# Patient Record
Sex: Female | Born: 1960 | Race: White | Hispanic: No | Marital: Married | Smoking: Former smoker
Health system: Southern US, Community
[De-identification: ages and names within clinical notes are randomized; demographics above are authoritative.]

## PROBLEM LIST (undated history)

## (undated) DIAGNOSIS — F32A Depression, unspecified: Secondary | ICD-10-CM

## (undated) DIAGNOSIS — F419 Anxiety disorder, unspecified: Secondary | ICD-10-CM

## (undated) DIAGNOSIS — R112 Nausea with vomiting, unspecified: Secondary | ICD-10-CM

## (undated) DIAGNOSIS — R42 Dizziness and giddiness: Secondary | ICD-10-CM

## (undated) DIAGNOSIS — N6310 Unspecified lump in the right breast, unspecified quadrant: Secondary | ICD-10-CM

## (undated) DIAGNOSIS — M1990 Unspecified osteoarthritis, unspecified site: Secondary | ICD-10-CM

## (undated) DIAGNOSIS — K219 Gastro-esophageal reflux disease without esophagitis: Secondary | ICD-10-CM

## (undated) DIAGNOSIS — M722 Plantar fascial fibromatosis: Secondary | ICD-10-CM

## (undated) DIAGNOSIS — Z9889 Other specified postprocedural states: Secondary | ICD-10-CM

## (undated) DIAGNOSIS — B019 Varicella without complication: Secondary | ICD-10-CM

## (undated) DIAGNOSIS — U071 COVID-19: Secondary | ICD-10-CM

## (undated) HISTORY — PX: HIP ARTHROPLASTY: SHX981

## (undated) HISTORY — DX: Anxiety disorder, unspecified: F41.9

## (undated) HISTORY — PX: OTHER SURGICAL HISTORY: SHX169

## (undated) HISTORY — DX: Gastro-esophageal reflux disease without esophagitis: K21.9

## (undated) HISTORY — DX: Unspecified osteoarthritis, unspecified site: M19.90

## (undated) HISTORY — PX: CARPAL TUNNEL RELEASE: SHX101

## (undated) HISTORY — DX: Plantar fascial fibromatosis: M72.2

## (undated) HISTORY — DX: Depression, unspecified: F32.A

## (undated) HISTORY — DX: COVID-19: U07.1

## (undated) HISTORY — DX: Varicella without complication: B01.9

---
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
## 1999-05-07 HISTORY — PX: LASIK: SHX215

## 2004-01-06 ENCOUNTER — Other Ambulatory Visit: Payer: Self-pay

## 2004-02-28 ENCOUNTER — Ambulatory Visit: Payer: Self-pay | Admitting: Unknown Physician Specialty

## 2004-08-22 ENCOUNTER — Ambulatory Visit: Payer: Self-pay | Admitting: Unknown Physician Specialty

## 2004-11-15 ENCOUNTER — Ambulatory Visit: Payer: Self-pay

## 2005-02-25 ENCOUNTER — Ambulatory Visit: Payer: Self-pay | Admitting: Unknown Physician Specialty

## 2005-04-30 ENCOUNTER — Ambulatory Visit: Payer: Self-pay | Admitting: Orthopaedic Surgery

## 2005-05-21 ENCOUNTER — Ambulatory Visit: Payer: Self-pay | Admitting: Unknown Physician Specialty

## 2005-12-13 ENCOUNTER — Ambulatory Visit: Payer: Self-pay | Admitting: Unknown Physician Specialty

## 2006-02-25 ENCOUNTER — Ambulatory Visit: Payer: Self-pay | Admitting: Unknown Physician Specialty

## 2006-04-09 ENCOUNTER — Ambulatory Visit: Payer: Self-pay | Admitting: Pain Medicine

## 2006-04-17 ENCOUNTER — Ambulatory Visit: Payer: Self-pay | Admitting: Pain Medicine

## 2006-05-08 ENCOUNTER — Ambulatory Visit: Payer: Self-pay | Admitting: Physician Assistant

## 2006-05-29 ENCOUNTER — Ambulatory Visit: Payer: Self-pay | Admitting: Pain Medicine

## 2006-06-11 ENCOUNTER — Ambulatory Visit: Payer: Self-pay | Admitting: Unknown Physician Specialty

## 2006-06-12 ENCOUNTER — Ambulatory Visit: Payer: Self-pay | Admitting: Pain Medicine

## 2006-06-30 ENCOUNTER — Ambulatory Visit: Payer: Self-pay | Admitting: Physician Assistant

## 2006-07-21 ENCOUNTER — Ambulatory Visit: Payer: Self-pay | Admitting: Chiropractic Medicine

## 2006-08-13 ENCOUNTER — Encounter: Payer: Self-pay | Admitting: Neurosurgery

## 2006-09-04 ENCOUNTER — Encounter: Payer: Self-pay | Admitting: Neurosurgery

## 2007-06-29 ENCOUNTER — Ambulatory Visit: Payer: Self-pay | Admitting: Unknown Physician Specialty

## 2008-05-06 HISTORY — PX: REDUCTION MAMMAPLASTY: SUR839

## 2008-05-06 HISTORY — PX: BREAST SURGERY: SHX581

## 2008-07-07 ENCOUNTER — Ambulatory Visit: Payer: Self-pay | Admitting: Unknown Physician Specialty

## 2009-07-10 ENCOUNTER — Ambulatory Visit: Payer: Self-pay | Admitting: Unknown Physician Specialty

## 2009-08-11 ENCOUNTER — Ambulatory Visit: Payer: Self-pay | Admitting: Unknown Physician Specialty

## 2010-05-06 HISTORY — PX: CERVICAL SPINE SURGERY: SHX589

## 2010-07-24 ENCOUNTER — Ambulatory Visit: Payer: Self-pay | Admitting: Unknown Physician Specialty

## 2010-11-06 ENCOUNTER — Encounter (HOSPITAL_COMMUNITY)
Admission: RE | Admit: 2010-11-06 | Discharge: 2010-11-06 | Disposition: A | Discharge status: Home / Self Care | Payer: BC Managed Care – PPO | Source: Ambulatory Visit | Attending: Neurosurgery | Admitting: Neurosurgery

## 2010-11-06 LAB — URINALYSIS, ROUTINE W REFLEX MICROSCOPIC
Glucose, UA: NEGATIVE mg/dL
Ketones, ur: NEGATIVE mg/dL
Leukocytes, UA: NEGATIVE
Nitrite: NEGATIVE
Specific Gravity, Urine: 1.017 (ref 1.005–1.030)
pH: 5.5 (ref 5.0–8.0)

## 2010-11-06 LAB — BASIC METABOLIC PANEL
Calcium: 9.2 mg/dL (ref 8.4–10.5)
GFR calc Af Amer: >60 mL/min (ref >60)
GFR calc non Af Amer: >60 mL/min (ref >60)
Glucose, Bld: 86 mg/dL (ref 70–99)
Potassium: 4.4 mEq/L (ref 3.5–5.1)
Sodium: 138 mEq/L (ref 135–145)

## 2010-11-06 LAB — APTT: aPTT: 26 seconds (ref 24–37)

## 2010-11-06 LAB — PROTIME-INR
INR: 0.88 (ref 0.00–1.49)
Prothrombin Time: 12.1 seconds (ref 11.6–15.2)

## 2010-11-06 LAB — SURGICAL PCR SCREEN: Staphylococcus aureus: NEGATIVE

## 2010-11-06 LAB — CBC
HCT: 38.8 % (ref 36.0–46.0)
Hemoglobin: 13.3 g/dL (ref 12.0–15.0)
MCHC: 34.3 g/dL (ref 30.0–36.0)
RBC: 4.39 MIL/uL (ref 3.87–5.11)
WBC: 5.1 10*3/uL (ref 4.0–10.5)

## 2010-11-13 ENCOUNTER — Inpatient Hospital Stay (HOSPITAL_COMMUNITY): Payer: BC Managed Care – PPO

## 2010-11-13 ENCOUNTER — Ambulatory Visit (HOSPITAL_COMMUNITY)
Admission: RE | Admit: 2010-11-13 | Discharge: 2010-11-14 | Disposition: A | Discharge status: Home / Self Care | Payer: BC Managed Care – PPO | Source: Ambulatory Visit | Attending: Neurosurgery | Admitting: Neurosurgery

## 2010-11-13 DIAGNOSIS — M5000 Cervical disc disorder with myelopathy, unspecified cervical region: Secondary | ICD-10-CM | POA: Insufficient documentation

## 2010-11-13 DIAGNOSIS — M4712 Other spondylosis with myelopathy, cervical region: Principal | ICD-10-CM | POA: Insufficient documentation

## 2010-11-13 DIAGNOSIS — M129 Arthropathy, unspecified: Secondary | ICD-10-CM | POA: Insufficient documentation

## 2010-11-13 DIAGNOSIS — K219 Gastro-esophageal reflux disease without esophagitis: Secondary | ICD-10-CM | POA: Insufficient documentation

## 2010-11-13 DIAGNOSIS — Z01812 Encounter for preprocedural laboratory examination: Secondary | ICD-10-CM | POA: Insufficient documentation

## 2010-11-19 NOTE — Op Note (Signed)
NAMEROLENA, KNUTSON NO.:  [id number]  MEDICAL RECORD NO.:  [id number]  LOCATION:  3526                         FACILITY:  [facility]  PHYSICIAN:  Clydene Fake, M.D.  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  PROCEDURE:  Anterior cervical decompression and diskectomy and fusion of C4-5, C5-6, C6-7 with LifeNet allograft bones, and Trestle anterior cervical plate.  SURGEON:  Clydene Fake, MD  ASSISTANT:  Hewitt Shorts, MD  General endotracheal tube anesthesia.  ESTIMATED BLOOD LOSS:  Minimal.  BLOOD GIVEN:  None.  DRAINS:  None.  COMPLICATIONS:  None.  REASON FOR PROCEDURE:  The patient is a 50 year old woman who has been having neck and right arm pain numbness and weakness found to have weakness in the right biceps and triceps and positive Spurling's at the right side.  MRI and x-ray shows spondylitic changes at multiple levels and though there was a bifemoral narrowing much worse on the right side at the C4-5 and C5-6 levels, bifemoral narrowing at the C6-7 level.  The patient was brought in for decompression and fusion at these 3 levels.  PROCEDURE IN DETAIL:  The patient was brought to the operating room and general anesthesia induced.  The patient was placed in 10 pounds of Halter traction, prepped and draped in usual sterile fashion.  Site of incision injected with 10 mL of 1% lidocaine with epinephrine.  Incision was then made in the left side neck from midline to the anterior border of the sternocleidomastoid muscle.  Incision was taken down to the platysma and  hemostasis was obtained with Bovie cauterization.  Platysma muscle was incised with Bovie and blunt dissection taken through the anterior cervical fascia to the anterior cervical spine.  We placed 2 needles where we thought it was the C4-5 and C6-7 disk spaces, and another at the C5-6 space in between where the needles were and took a lateral C-spine x-ray which confirmed our positioning.  Disk spaces were incised with a 15-blade and partial diskectomy done as the needles were removed at the C4-5 and C6-7 levels and then disk space at C5-6 was incised and partial diskectomy done.  The longus coli muscles were reflected laterally using Bovie and self-retaining retractor was placed in C4, C5 and C6 spaces.  Distraction pins were placed in the C5 and C6 and this was distracted and microscope was brought in for microdissection.  Diskectomy was then done with pituitary rongeurs and curettes.  High-speed drill to remove osteophytes and removed just up and then 1-2 mm Kerrison punches were used to remove posterior disk osteophytes and ligament decompressing the central canal and performing bilateral foraminotomies and extensive foraminotomies to the right side were done.  At these two levels, decompressing the nerve roots to the left side where we made sure the roots were decompressed also.  We measured height of the disk space to be 4-mm at each space and once we had got good decompression and hemostasis, we tapped in the 4-mm LifeNet allograft bone into both the C4-5 and C5-6 spaces.  Weight was removed and the distraction pins were removed from C4.  Retractor was removed down, so that we can see the C6-7 level and we placed a distraction pin to C7 and then distracted the C6-7 interspace.  Diskectomy was then done at the levels.  High-speed drill, removing osteophytes, cartilaginous endplate with curettes and 1-2-mm Kerrison punches were used to continue decompression to decompress the central  canal and we performed bilateral foraminotomies.  At the C6-7 levels, we got worse right-sided root compression and we did bilateral foraminotomies decompressing the nerve roots.  When we were finished, we had good decompression.  We measured the height of disk space to 4 mm, and 4-mm LifeNet allograft bone was tapped in place and distraction pins were removed, weight was removed from the traction device.  Three disk spaces and bone plug was in good firm position at all spaces.  Trestle anterior cervical plate was placed over the anterior cervical spine and two screws placed in C4, two in C5, two in C6, two in C7, and these were tightened.  Lateral C-spine x-ray was done showing good position of plate, screws, bone plugs at the C4-5, C5-C6 and C6-C7 levels.  We irrigated with antibiotic solution and retractors were removed.  We had good hemostasis.  The platysma was closed with 3-0 Vicryl interrupted sutures.  Subcutaneous tissue was closed with same.  Skin was closed with benzoin, Steri-Strips.  Dressing was placed.  The patient was placed in the cervical collar, woken from anesthesia and transferred to recovery room in stable condition.      ______________________________ Clydene Fake, M.D.     JRH/MEDQ  D:  11/13/2010  T:  11/14/2010  Job:  [id number]  Electronically Signed by Colon Branch M.D. on 11/19/2010 09:38:29 AM

## 2015-05-07 HISTORY — PX: BREAST EXCISIONAL BIOPSY: SUR124

## 2015-08-07 ENCOUNTER — Ambulatory Visit (INDEPENDENT_AMBULATORY_CARE_PROVIDER_SITE_OTHER): Payer: BC Managed Care – PPO | Admitting: Nurse Practitioner

## 2015-08-07 ENCOUNTER — Encounter: Payer: Self-pay | Admitting: Nurse Practitioner

## 2015-08-07 VITALS — BP 152/102 | HR 65 | Temp 98.5°F | Ht 64.75 in | Wt 193.0 lb

## 2015-08-07 DIAGNOSIS — M779 Enthesopathy, unspecified: Secondary | ICD-10-CM

## 2015-08-07 DIAGNOSIS — Z7189 Other specified counseling: Secondary | ICD-10-CM

## 2015-08-07 DIAGNOSIS — Z7689 Persons encountering health services in other specified circumstances: Secondary | ICD-10-CM

## 2015-08-07 DIAGNOSIS — M2550 Pain in unspecified joint: Secondary | ICD-10-CM | POA: Diagnosis not present

## 2015-08-07 DIAGNOSIS — K219 Gastro-esophageal reflux disease without esophagitis: Secondary | ICD-10-CM | POA: Diagnosis not present

## 2015-08-07 DIAGNOSIS — F418 Other specified anxiety disorders: Secondary | ICD-10-CM | POA: Diagnosis not present

## 2015-08-07 DIAGNOSIS — R030 Elevated blood-pressure reading, without diagnosis of hypertension: Secondary | ICD-10-CM

## 2015-08-07 DIAGNOSIS — IMO0001 Reserved for inherently not codable concepts without codable children: Secondary | ICD-10-CM

## 2015-08-07 DIAGNOSIS — M778 Other enthesopathies, not elsewhere classified: Secondary | ICD-10-CM

## 2015-08-07 DIAGNOSIS — M6588 Other synovitis and tenosynovitis, other site: Secondary | ICD-10-CM

## 2015-08-07 MED ORDER — CYCLOBENZAPRINE HCL 10 MG PO TABS: 10.0000 mg | ORAL_TABLET | Freq: Every day | ORAL | Status: DC

## 2015-08-07 MED ORDER — ALPRAZOLAM 0.25 MG PO TABS: 0.2500 mg | ORAL_TABLET | ORAL | Status: DC | PRN

## 2015-08-07 NOTE — Patient Instructions (Signed)
Any pharmacy will have a brace/orthotics section- get the wrist brace that encompasses wrist and thumb. [facility] has a cooling kind advertised online.   Welcome to [facility]! Nice to meet you.

## 2015-08-07 NOTE — Progress Notes (Signed)
Pre visit review using our clinic review tool, if applicable. No additional management support is needed unless otherwise documented below in the visit note. 

## 2015-08-07 NOTE — Progress Notes (Signed)
Patient ID: Yvonne Moore, female    DOB: 1961-03-22  Age: 55 y.o. MRN: [id number]  CC: Establish Care   HPI Yvonne Moore presents for Establishing care  And CC of need for medication refills, rib pain, thumb pain, elbow pain, and left shoulder pain.   1) New Pt Info:   Immunizations- Need records   Mammogram- OB/GYN  Pap- OB/GYN- Levin Erp, PA 2015  Colonoscopy- UTD 2014  2) Chronic Problems-  Arthritis, GERD, h/o Chicken pox   3) Acute Problems- Shoulder surgery- flexeril  Xanax- script   Left side rib pain to flank  Hurt with breathing deeply, moving, coughing ect... Massage  Right thumb- right hand dominant  Left elbow- pain Left shoulder- shot from Dr. Farris Has, improved   HTN- Pt reports great BP numbers in past, very surprised today, pt will keep an eye on it with her home cuff, pt reports this is likely just situational.    History Yvonne Moore has a past medical history of Arthritis; Chicken pox; and GERD (gastroesophageal reflux disease).   Yvonne Moore has past surgical history that includes Tonsillectomy and adenoidectomy (1967); Shoulder surgery (Left, 1982); LASIK (Bilateral, 2001); Cervical spine surgery (2012); Carpal tunnel release; and Breast surgery (Bilateral, 2010).   Her family history includes Alcohol abuse in her brother and father; Arthritis in her father, maternal aunt, maternal uncle, and mother; Colon cancer in her paternal uncle; Hypertension in her brother, father, maternal grandmother, and mother; Kidney disease in her mother; Lung cancer in her father, maternal grandmother, and maternal uncle.Yvonne Moore reports that Yvonne Moore has never smoked. Yvonne Moore has never used smokeless tobacco. Yvonne Moore reports that Yvonne Moore drinks alcohol. Yvonne Moore reports that Yvonne Moore does not use illicit drugs.  No outpatient prescriptions prior to visit.   No facility-administered medications prior to visit.    ROS Review of Systems  Constitutional: Negative for fever, chills, diaphoresis and fatigue.   Respiratory: Negative for chest tightness, shortness of breath and wheezing.   Cardiovascular: Negative for chest pain, palpitations and leg swelling.  Gastrointestinal: Negative for nausea, vomiting and diarrhea.  Musculoskeletal: Positive for myalgias and arthralgias.  Skin: Negative for rash.  Neurological: Negative for dizziness, weakness, numbness and headaches.  Psychiatric/Behavioral: The patient is nervous/anxious.     Objective:  BP 152/102 mmHg  Pulse 65  Temp(Src) 98.5 F (36.9 C) (Oral)  Ht 5' 4.75" (1.645 m)  Wt 193 lb (87.544 kg)  BMI 32.35 kg/m2  SpO2 98%  LMP 06/05/2015 (Approximate)  Physical Exam  Constitutional: Yvonne Moore is oriented to person, place, and time. Yvonne Moore appears well-developed and well-nourished. No distress.  HENT:  Head: Normocephalic and atraumatic.  Right Ear: External ear normal.  Left Ear: External ear normal.  Mouth/Throat: No oropharyngeal exudate.  Eyes: EOM are normal. Pupils are equal, round, and reactive to light. Right eye exhibits no discharge. Left eye exhibits no discharge. No scleral icterus.  Neck: Normal range of motion. Neck supple. No thyromegaly present.  Cardiovascular: Normal rate and regular rhythm.   Pulmonary/Chest: Effort normal and breath sounds normal. No respiratory distress. Yvonne Moore has no wheezes. Yvonne Moore has no rales. Yvonne Moore exhibits no tenderness.  Abdominal: Soft. Bowel sounds are normal. Yvonne Moore exhibits no distension and no mass. There is no tenderness. There is no rebound and no guarding.  Neg for CVA tenderness  Musculoskeletal: Normal range of motion. Yvonne Moore exhibits tenderness. Yvonne Moore exhibits no edema.  No visual deformities to right thumb, left elbow, left shoulder, nor left ribs/flank areas. Tenderness to palpation of each at multiple points except  for the left elbow- radial side near joint. Neg finkelstein of right side  Lymphadenopathy:    Yvonne Moore has no cervical adenopathy.  Neurological: Yvonne Moore is alert and oriented to person, place,  and time. Yvonne Moore displays normal reflexes. No cranial nerve deficit. Yvonne Moore exhibits normal muscle tone. Coordination normal.  Skin: Skin is warm and dry. No rash noted. Yvonne Moore is not diaphoretic.  Psychiatric: Yvonne Moore has a normal mood and affect. Her behavior is normal. Judgment and thought content normal.   Assessment & Plan:   Yvonne Moore was seen today for establish care.  Diagnoses and all orders for this visit:  Encounter to establish care  Arthralgia of multiple joints  Gastroesophageal reflux disease without esophagitis  Depression with anxiety  Other orders -     ALPRAZolam (XANAX) 0.25 MG tablet; Take 1 tablet (0.25 mg total) by mouth as needed for anxiety. -     cyclobenzaprine (FLEXERIL) 10 MG tablet; Take 1 tablet (10 mg total) by mouth at bedtime.  I have changed Yvonne Moore's ALPRAZolam and cyclobenzaprine. I am also having her maintain her sertraline, omeprazole, and Multiple Vitamins-Minerals (WOMENS MULTIVITAMIN PLUS PO).  Meds ordered this encounter  Medications  . sertraline (ZOLOFT) 50 MG tablet    Sig:   . omeprazole (PRILOSEC) 20 MG capsule    Sig: Take 20 mg by mouth daily.  Marland Kitchen. DISCONTD: cyclobenzaprine (FLEXERIL) 10 MG tablet    Sig: Take 10 mg by mouth as needed for muscle spasms.  Marland Kitchen. DISCONTD: ALPRAZolam (XANAX) 0.25 MG tablet    Sig: Take 0.25 mg by mouth as needed for anxiety.  . Multiple Vitamins-Minerals (WOMENS MULTIVITAMIN PLUS PO)    Sig: Take by mouth once.  . ALPRAZolam (XANAX) 0.25 MG tablet    Sig: Take 1 tablet (0.25 mg total) by mouth as needed for anxiety.    Dispense:  30 tablet    Refill:  0    Order Specific Question:  Supervising Provider    Answer:  Duncan DullULLO, TERESA L [[id number]]  . cyclobenzaprine (FLEXERIL) 10 MG tablet    Sig: Take 1 tablet (10 mg total) by mouth at bedtime.    Dispense:  30 tablet    Refill:  1    Order Specific Question:  Supervising Provider    Answer:  Sherlene ShamsULLO, TERESA L [[id number]]     Follow-up: Return in about 6 months (around  02/06/2016) for Follow up.

## 2015-08-13 ENCOUNTER — Encounter: Payer: Self-pay | Admitting: Nurse Practitioner

## 2015-08-13 DIAGNOSIS — M2550 Pain in unspecified joint: Secondary | ICD-10-CM | POA: Insufficient documentation

## 2015-08-13 DIAGNOSIS — R030 Elevated blood-pressure reading, without diagnosis of hypertension: Secondary | ICD-10-CM

## 2015-08-13 DIAGNOSIS — IMO0001 Reserved for inherently not codable concepts without codable children: Secondary | ICD-10-CM | POA: Insufficient documentation

## 2015-08-13 DIAGNOSIS — Z7689 Persons encountering health services in other specified circumstances: Secondary | ICD-10-CM | POA: Insufficient documentation

## 2015-08-13 DIAGNOSIS — M779 Enthesopathy, unspecified: Secondary | ICD-10-CM

## 2015-08-13 DIAGNOSIS — F419 Anxiety disorder, unspecified: Secondary | ICD-10-CM

## 2015-08-13 DIAGNOSIS — F329 Major depressive disorder, single episode, unspecified: Secondary | ICD-10-CM | POA: Insufficient documentation

## 2015-08-13 DIAGNOSIS — K219 Gastro-esophageal reflux disease without esophagitis: Secondary | ICD-10-CM | POA: Insufficient documentation

## 2015-08-13 DIAGNOSIS — M778 Other enthesopathies, not elsewhere classified: Secondary | ICD-10-CM | POA: Insufficient documentation

## 2015-08-13 NOTE — Assessment & Plan Note (Signed)
RICE, OTC wrist brace that wraps around thumb for immobilization, NSAIDs prn. FU w/ ortho if worsening

## 2015-08-13 NOTE — Assessment & Plan Note (Signed)
Discussed acute and chronic issues. Reviewed health maintenance measures, PFSHx, and immunizations. Obtain records from previous facility.   

## 2015-08-13 NOTE — Assessment & Plan Note (Signed)
BP Readings from Last 3 Encounters:  08/07/15 152/102   Watching at home, repeat at 6 mos visit, pt reports situational anxiety is reason. Denies salt intake or other changes recently

## 2015-08-13 NOTE — Assessment & Plan Note (Signed)
Pt requesting refill of prn xanax- had an expired bottle in room Pt would like just a printed script in case of need for filling NCCSRS checked for last script given  Will have pt fill out CSC and UDS at another visit

## 2015-08-13 NOTE — Assessment & Plan Note (Signed)
Flexeril refilled.  Pt sees Dr. Farris HasKramer at [facility]  FU prn worsening/failure to improve.

## 2015-08-13 NOTE — Assessment & Plan Note (Signed)
Stable currently on Prilosec  Encouraged healthy diet and small portions with exercise for better control FU prn worsening/failure to improve.

## 2015-08-22 ENCOUNTER — Encounter: Payer: Self-pay | Admitting: Nurse Practitioner

## 2015-08-28 ENCOUNTER — Ambulatory Visit (INDEPENDENT_AMBULATORY_CARE_PROVIDER_SITE_OTHER): Payer: BC Managed Care – PPO | Admitting: Nurse Practitioner

## 2015-08-28 ENCOUNTER — Encounter: Payer: Self-pay | Admitting: Nurse Practitioner

## 2015-08-28 VITALS — BP 122/64 | HR 67 | Temp 98.3°F | Resp 14 | Ht 64.75 in | Wt 196.4 lb

## 2015-08-28 DIAGNOSIS — R202 Paresthesia of skin: Secondary | ICD-10-CM | POA: Diagnosis not present

## 2015-08-28 MED ORDER — GABAPENTIN 300 MG PO CAPS: 300.0000 mg | ORAL_CAPSULE | Freq: Every day | ORAL | Status: DC

## 2015-08-28 NOTE — Patient Instructions (Signed)
Let me know how the 1 gabapentin capsule at night time helps (give it at least 1 week). You can send me a message via MyChart, follow up in 2 weeks, or follow up with Ortho.  Good to see you.

## 2015-08-28 NOTE — Progress Notes (Signed)
Patient ID: Yvonne Moore, female    DOB: Oct 08, 1960  Age: 55 y.o. MRN: [id number]  CC: Hip Pain   HPI Yvonne Moore presents for CC of right hip pain x 2 weeks.   1) Right anterior "hip pain"  Onset- 2 weeks Location- Anterior top of hip at waistline  Duration - 1 minute then it eases off, never eases completely off Characteristics- Aching and burning  Aggravating factors- going from sitting to standing, putting weight on right,  going down stairs  Relieving factors- lying still, going up stairs  Severity- Severe with standing, mild at best   Nothing out of the ordinary to cause this pain Limping when getting up from sitting position  Treatment to date- aleve, ice, rest, heat  Flexeril not helpful    History Yvonne Moore has a past medical history of Arthritis; Chicken pox; and GERD (gastroesophageal reflux disease).   Yvonne Moore has past surgical history that includes Tonsillectomy and adenoidectomy (1967); Shoulder surgery (Left, 1982); LASIK (Bilateral, 2001); Cervical spine surgery (2012); Carpal tunnel release; and Breast surgery (Bilateral, 2010).   Her family history includes Alcohol abuse in her brother and father; Arthritis in her father, maternal aunt, maternal uncle, and mother; Colon cancer in her paternal uncle; Hypertension in her brother, father, maternal grandmother, and mother; Kidney disease in her mother; Lung cancer in her father, maternal grandmother, and maternal uncle.Yvonne Moore reports that Yvonne Moore has never smoked. Yvonne Moore has never used smokeless tobacco. Yvonne Moore reports that Yvonne Moore drinks alcohol. Yvonne Moore reports that Yvonne Moore does not use illicit drugs.  Outpatient Prescriptions Prior to Visit  Medication Sig Dispense Refill  . ALPRAZolam (XANAX) 0.25 MG tablet Take 1 tablet (0.25 mg total) by mouth as needed for anxiety. 30 tablet 0  . cyclobenzaprine (FLEXERIL) 10 MG tablet Take 1 tablet (10 mg total) by mouth at bedtime. 30 tablet 1  . Multiple Vitamins-Minerals (WOMENS MULTIVITAMIN PLUS  PO) Take by mouth once.    Marland Kitchen omeprazole (PRILOSEC) 20 MG capsule Take 20 mg by mouth daily.    . sertraline (ZOLOFT) 50 MG tablet      No facility-administered medications prior to visit.    ROS Review of Systems  Constitutional: Negative for fever, chills, diaphoresis and fatigue.  Respiratory: Negative for chest tightness, shortness of breath and wheezing.   Cardiovascular: Negative for chest pain, palpitations and leg swelling.  Gastrointestinal: Negative for nausea, vomiting and diarrhea.  Musculoskeletal: Positive for back pain and arthralgias. Negative for myalgias, joint swelling, gait problem, neck pain and neck stiffness.       Chronic back pain  Skin: Negative for rash.  Neurological: Negative for dizziness, weakness, numbness and headaches.  Psychiatric/Behavioral: The patient is not nervous/anxious.     Objective:  BP 122/64 mmHg  Pulse 67  Temp(Src) 98.3 F (36.8 C) (Oral)  Resp 14  Ht 5' 4.75" (1.645 m)  Wt 196 lb 6.4 oz (89.086 kg)  BMI 32.92 kg/m2  SpO2 97%  LMP 06/05/2015 (Approximate)  Physical Exam  Constitutional: Yvonne Moore is oriented to person, place, and time. Yvonne Moore appears well-developed and well-nourished. No distress.  HENT:  Head: Normocephalic and atraumatic.  Right Ear: External ear normal.  Left Ear: External ear normal.  Cardiovascular: Normal rate, regular rhythm and normal heart sounds.   Pulmonary/Chest: Effort normal and breath sounds normal. No respiratory distress. Yvonne Moore has no wheezes. Yvonne Moore has no rales. Yvonne Moore exhibits no tenderness.  Musculoskeletal: Yvonne Moore exhibits no edema or tenderness.       Legs: Pain with internal rotation of  hip, but only anterior  No rashes or deformities, no point tenderness to palpation of hip and surrounding musculature  Neurological: Yvonne Moore is alert and oriented to person, place, and time. Yvonne Moore displays normal reflexes. No cranial nerve deficit. Yvonne Moore exhibits normal muscle tone. Coordination normal.  Iliopsoas 5/5 Bilateral,  Tib anterior 5/5 bilateral, EHL 5/5 bilateral, no ankle clonus, intact heel/toe/sequential walking, sensation intact upper and lower extremities. Straight leg raise negative bilaterally.   Skin: Skin is warm and dry. No rash noted. Yvonne Moore is not diaphoretic.  Psychiatric: Yvonne Moore has a normal mood and affect. Her behavior is normal. Judgment and thought content normal.   Assessment & Plan:   Yvonne Moore was seen today for hip pain.  Diagnoses and all orders for this visit:  Paresthesia of right lower extremity  Other orders -     gabapentin (NEURONTIN) 300 MG capsule; Take 1 capsule (300 mg total) by mouth at bedtime.  I am having Yvonne Moore start on gabapentin. I am also having her maintain her sertraline, omeprazole, Multiple Vitamins-Minerals (WOMENS MULTIVITAMIN PLUS PO), ALPRAZolam, and cyclobenzaprine.  Meds ordered this encounter  Medications  . gabapentin (NEURONTIN) 300 MG capsule    Sig: Take 1 capsule (300 mg total) by mouth at bedtime.    Dispense:  30 capsule    Refill:  1    Order Specific Question:  Supervising Provider    Answer:  Sherlene ShamsULLO, TERESA L [[id number]]     Follow-up: Return in about 2 weeks (around 09/11/2015) for Follow up for hip pain.

## 2015-08-28 NOTE — Assessment & Plan Note (Signed)
New onset After discussion and description by pt believed to be nerve related. Willing to try gabapentin 1 cap QHS for 1-2 weeks to see if helpful.  FU sooner if worsening, may have her f/u w/ Ortho

## 2015-09-05 ENCOUNTER — Other Ambulatory Visit: Payer: Self-pay | Admitting: Nurse Practitioner

## 2015-09-05 ENCOUNTER — Encounter: Payer: Self-pay | Admitting: Nurse Practitioner

## 2015-09-12 ENCOUNTER — Ambulatory Visit
Admission: RE | Admit: 2015-09-12 | Discharge: 2015-09-12 | Disposition: A | Discharge status: Home / Self Care | Payer: BC Managed Care – PPO | Source: Ambulatory Visit | Attending: Nurse Practitioner | Admitting: Nurse Practitioner

## 2015-09-12 ENCOUNTER — Encounter: Payer: Self-pay | Admitting: Nurse Practitioner

## 2015-09-12 ENCOUNTER — Ambulatory Visit (INDEPENDENT_AMBULATORY_CARE_PROVIDER_SITE_OTHER): Payer: BC Managed Care – PPO | Admitting: Nurse Practitioner

## 2015-09-12 VITALS — BP 108/76 | HR 70 | Temp 97.7°F | Ht 65.0 in | Wt 197.0 lb

## 2015-09-12 DIAGNOSIS — R202 Paresthesia of skin: Secondary | ICD-10-CM | POA: Diagnosis not present

## 2015-09-12 DIAGNOSIS — M5136 Other intervertebral disc degeneration, lumbar region: Secondary | ICD-10-CM | POA: Diagnosis not present

## 2015-09-12 DIAGNOSIS — M25551 Pain in right hip: Secondary | ICD-10-CM | POA: Insufficient documentation

## 2015-09-12 DIAGNOSIS — M2550 Pain in unspecified joint: Secondary | ICD-10-CM

## 2015-09-12 DIAGNOSIS — M5137 Other intervertebral disc degeneration, lumbosacral region: Secondary | ICD-10-CM | POA: Insufficient documentation

## 2015-09-12 DIAGNOSIS — M545 Low back pain: Secondary | ICD-10-CM | POA: Diagnosis present

## 2015-09-12 MED ORDER — DICLOFENAC SODIUM 1 % TD GEL: 2.0000 g | Freq: Four times a day (QID) | TRANSDERMAL | Status: DC

## 2015-09-12 NOTE — Progress Notes (Signed)
Patient ID: Yvonne Moore, female    DOB: [birth year]  Age: 55 y.o. MRN: [id number]  CC: Follow-up   HPI Yvonne Moore Serieseeann Yvonne Moore presents for follow-up of multiple joint arthralgias.  1) patient continues to have right thumb and left elbow pain. The left shoulder is improved with injection from Yvonne Moore.  2) Slightly better pain in hip after taking the gabapentin at night. Asked her to increase to 2 capsules at nighttime.  Aleve and Meloxicam not helpful  Arnica gel and biofreeze- not helpful   History Yvonne Moore has a past medical history of Arthritis; Chicken pox; and GERD (gastroesophageal reflux disease).   Yvonne Moore has past surgical history that includes Tonsillectomy and adenoidectomy (1967); Shoulder surgery (Left, 1982); LASIK (Bilateral, 2001); Cervical spine surgery (2012); Carpal tunnel release; and Breast surgery (Bilateral, 2010).   Her family history includes Alcohol abuse in her brother and father; Arthritis in her father, maternal aunt, maternal uncle, and mother; Colon cancer in her paternal uncle; Hypertension in her brother, father, maternal grandmother, and mother; Kidney disease in her mother; Lung cancer in her father, maternal grandmother, and maternal uncle.Yvonne Moore reports that Yvonne Moore has never smoked. Yvonne Moore has never used smokeless tobacco. Yvonne Moore reports that Yvonne Moore drinks alcohol. Yvonne Moore reports that Yvonne Moore does not use illicit drugs.  Outpatient Prescriptions Prior to Visit  Medication Sig Dispense Refill  . ALPRAZolam (XANAX) 0.25 MG tablet Take 1 tablet (0.25 mg total) by mouth as needed for anxiety. 30 tablet 0  . cyclobenzaprine (FLEXERIL) 10 MG tablet TAKE 1 TABLET (10 MG TOTAL) BY MOUTH AT BEDTIME. 30 tablet 0  . gabapentin (NEURONTIN) 300 MG capsule Take 1 capsule (300 mg total) by mouth at bedtime. 30 capsule 1  . Multiple Vitamins-Minerals (WOMENS MULTIVITAMIN PLUS PO) Take by mouth once.    Marland Kitchen. omeprazole (PRILOSEC) 20 MG capsule Take 20 mg by mouth daily.    . sertraline (ZOLOFT) 50 MG  tablet      No facility-administered medications prior to visit.    ROS Review of Systems  Constitutional: Negative for fever, chills, diaphoresis and fatigue.  Respiratory: Negative for chest tightness, shortness of breath and wheezing.   Cardiovascular: Negative for chest pain, palpitations and leg swelling.  Gastrointestinal: Negative for nausea, vomiting and diarrhea.  Musculoskeletal: Positive for myalgias, back pain, arthralgias and gait problem. Negative for joint swelling, neck pain and neck stiffness.  Skin: Negative for rash.  Neurological: Negative for dizziness, weakness, numbness and headaches.  Psychiatric/Behavioral: The patient is not nervous/anxious.     Objective:  BP 108/76 mmHg  Pulse 70  Temp(Src) 97.7 F (36.5 C) (Oral)  Ht 5\' 5"  (1.651 m)  Wt 197 lb (89.359 kg)  BMI 32.78 kg/m2  SpO2 98%  LMP 05/21/2015 (Approximate)  Physical Exam  Constitutional: Yvonne Moore is oriented to person, place, and time. Yvonne Moore appears well-developed and well-nourished. No distress.  HENT:  Head: Normocephalic and atraumatic.  Right Ear: External ear normal.  Left Ear: External ear normal.  Cardiovascular: Normal rate, regular rhythm and normal heart sounds.   Pulmonary/Chest: Effort normal and breath sounds normal. No respiratory distress. Yvonne Moore has no wheezes. Yvonne Moore has no rales. Yvonne Moore exhibits no tenderness.  Musculoskeletal: Normal range of motion. Yvonne Moore exhibits tenderness. Yvonne Moore exhibits no edema.  Right anterior hip tenderness  Neurological: Yvonne Moore is alert and oriented to person, place, and time. No cranial nerve deficit. Yvonne Moore exhibits normal muscle tone. Coordination normal.  Skin: Skin is warm and dry. No rash noted. Yvonne Moore is not diaphoretic.  Psychiatric: Yvonne Moore has  a normal mood and affect. Her behavior is normal. Judgment and thought content normal.   Assessment & Plan:   Yvonne Moore was seen today for follow-up.  Diagnoses and all orders for this visit:  Paresthesia of right lower  extremity -     DG HIP UNILAT WITH PELVIS 2-3 VIEWS RIGHT; Future -     DG Lumbar Spine Complete; Future  Arthralgia of multiple joints -     DG HIP UNILAT WITH PELVIS 2-3 VIEWS RIGHT; Future -     DG Lumbar Spine Complete; Future  Other orders -     diclofenac sodium (VOLTAREN) 1 % GEL; Apply 2 g topically 4 (four) times daily.   I am having Yvonne Moore start on diclofenac sodium. I am also having her maintain her sertraline, omeprazole, Multiple Vitamins-Minerals (WOMENS MULTIVITAMIN PLUS PO), ALPRAZolam, gabapentin, and cyclobenzaprine.  Meds ordered this encounter  Medications  . diclofenac sodium (VOLTAREN) 1 % GEL    Sig: Apply 2 g topically 4 (four) times daily.    Dispense:  1 Tube    Refill:  2    Order Specific Question:  Supervising Provider    Answer:  Sherlene Shams [[id number]]     Follow-up: Return if symptoms worsen or fail to improve.

## 2015-09-12 NOTE — Progress Notes (Signed)
Pre visit review using our clinic review tool, if applicable. No additional management support is needed unless otherwise documented below in the visit note. 

## 2015-09-12 NOTE — Patient Instructions (Signed)
Up your gabapentin to two capsules at night time.   Voltaren gel was sent to the pharmacy  [facility] [address] [place], [place] [address] Hours of Operation Monday - Friday, 8 a.m. - 5 p.m. Main: [phone]   Walk-in at any time during the hours above for your x-rays.

## 2015-09-14 ENCOUNTER — Other Ambulatory Visit: Payer: Self-pay | Admitting: Nurse Practitioner

## 2015-09-14 DIAGNOSIS — M5441 Lumbago with sciatica, right side: Secondary | ICD-10-CM

## 2015-09-14 NOTE — Assessment & Plan Note (Signed)
Established problem slightly improving Going to 2 capsules daily at bedtime for another 7 days to see if helpful X-ray of right hip and lumbar spine will help us decide where to go next.

## 2015-09-14 NOTE — Assessment & Plan Note (Signed)
Patient is still using Flexeril. Still seeing Dr. Farris HasKramer for Ortho. Patient will get x-rays today Try Voltaren gel for the small joints.

## 2015-09-18 ENCOUNTER — Other Ambulatory Visit: Payer: Self-pay | Admitting: Obstetrics and Gynecology

## 2015-09-18 DIAGNOSIS — Z1231 Encounter for screening mammogram for malignant neoplasm of breast: Secondary | ICD-10-CM

## 2015-09-20 ENCOUNTER — Other Ambulatory Visit: Payer: Self-pay

## 2015-09-20 MED ORDER — GABAPENTIN 300 MG PO CAPS: 300.0000 mg | ORAL_CAPSULE | Freq: Every day | ORAL | Status: DC

## 2015-09-20 NOTE — Telephone Encounter (Signed)
Please advise in her absence thanks,

## 2015-10-03 ENCOUNTER — Inpatient Hospital Stay
Admission: RE | Admit: 2015-10-03 | Discharge: 2015-10-03 | Disposition: A | Discharge status: Home / Self Care | Payer: Self-pay | Source: Ambulatory Visit | Attending: *Deleted | Admitting: *Deleted

## 2015-10-03 ENCOUNTER — Other Ambulatory Visit: Payer: Self-pay | Admitting: *Deleted

## 2015-10-03 ENCOUNTER — Ambulatory Visit
Admission: RE | Admit: 2015-10-03 | Discharge: 2015-10-03 | Disposition: A | Discharge status: Home / Self Care | Payer: BC Managed Care – PPO | Source: Ambulatory Visit | Attending: Obstetrics and Gynecology | Admitting: Obstetrics and Gynecology

## 2015-10-03 DIAGNOSIS — Z9289 Personal history of other medical treatment: Secondary | ICD-10-CM

## 2015-10-03 DIAGNOSIS — Z1231 Encounter for screening mammogram for malignant neoplasm of breast: Secondary | ICD-10-CM | POA: Insufficient documentation

## 2015-10-09 ENCOUNTER — Other Ambulatory Visit: Payer: Self-pay | Admitting: Obstetrics and Gynecology

## 2015-10-09 DIAGNOSIS — R928 Other abnormal and inconclusive findings on diagnostic imaging of breast: Secondary | ICD-10-CM

## 2015-10-13 ENCOUNTER — Ambulatory Visit
Admission: RE | Admit: 2015-10-13 | Discharge: 2015-10-13 | Disposition: A | Discharge status: Home / Self Care | Payer: BC Managed Care – PPO | Source: Ambulatory Visit | Attending: Obstetrics and Gynecology | Admitting: Obstetrics and Gynecology

## 2015-10-13 DIAGNOSIS — R921 Mammographic calcification found on diagnostic imaging of breast: Secondary | ICD-10-CM | POA: Insufficient documentation

## 2015-10-13 DIAGNOSIS — R928 Other abnormal and inconclusive findings on diagnostic imaging of breast: Secondary | ICD-10-CM | POA: Diagnosis present

## 2015-10-17 ENCOUNTER — Other Ambulatory Visit: Payer: Self-pay | Admitting: Obstetrics and Gynecology

## 2015-10-17 ENCOUNTER — Ambulatory Visit
Admission: RE | Admit: 2015-10-17 | Discharge: 2015-10-17 | Disposition: A | Discharge status: Home / Self Care | Payer: BC Managed Care – PPO | Source: Ambulatory Visit | Attending: Obstetrics and Gynecology | Admitting: Obstetrics and Gynecology

## 2015-10-17 DIAGNOSIS — R921 Mammographic calcification found on diagnostic imaging of breast: Secondary | ICD-10-CM | POA: Diagnosis present

## 2015-10-17 HISTORY — PX: BREAST BIOPSY: SHX20

## 2015-10-18 LAB — SURGICAL PATHOLOGY

## 2015-11-03 ENCOUNTER — Other Ambulatory Visit: Payer: Self-pay | Admitting: General Surgery

## 2015-11-03 DIAGNOSIS — Z1239 Encounter for other screening for malignant neoplasm of breast: Secondary | ICD-10-CM | POA: Insufficient documentation

## 2015-11-09 ENCOUNTER — Other Ambulatory Visit: Payer: Self-pay | Admitting: General Surgery

## 2015-11-09 DIAGNOSIS — N6099 Unspecified benign mammary dysplasia of unspecified breast: Secondary | ICD-10-CM

## 2015-11-14 ENCOUNTER — Ambulatory Visit
Admission: RE | Admit: 2015-11-14 | Discharge: 2015-11-14 | Disposition: A | Discharge status: Home / Self Care | Payer: BC Managed Care – PPO | Source: Ambulatory Visit | Attending: General Surgery | Admitting: General Surgery

## 2015-11-14 DIAGNOSIS — Z1239 Encounter for other screening for malignant neoplasm of breast: Secondary | ICD-10-CM

## 2015-11-14 MED ORDER — GADOBENATE DIMEGLUMINE 529 MG/ML IV SOLN
18.0000 mL | Freq: Once | INTRAVENOUS | Status: AC | PRN
  Administered 2015-11-14: 18 mL via INTRAVENOUS

## 2015-11-17 ENCOUNTER — Other Ambulatory Visit: Payer: Self-pay | Admitting: General Surgery

## 2015-11-17 DIAGNOSIS — N6099 Unspecified benign mammary dysplasia of unspecified breast: Secondary | ICD-10-CM

## 2015-11-28 ENCOUNTER — Encounter (HOSPITAL_BASED_OUTPATIENT_CLINIC_OR_DEPARTMENT_OTHER): Payer: Self-pay | Admitting: *Deleted

## 2015-11-30 ENCOUNTER — Encounter (HOSPITAL_BASED_OUTPATIENT_CLINIC_OR_DEPARTMENT_OTHER)
Admission: RE | Admit: 2015-11-30 | Discharge: 2015-11-30 | Disposition: A | Discharge status: Home / Self Care | Payer: BC Managed Care – PPO | Source: Ambulatory Visit | Attending: General Surgery | Admitting: General Surgery

## 2015-11-30 DIAGNOSIS — N6091 Unspecified benign mammary dysplasia of right breast: Secondary | ICD-10-CM | POA: Diagnosis not present

## 2015-11-30 DIAGNOSIS — K219 Gastro-esophageal reflux disease without esophagitis: Secondary | ICD-10-CM | POA: Diagnosis not present

## 2015-11-30 DIAGNOSIS — M1990 Unspecified osteoarthritis, unspecified site: Secondary | ICD-10-CM | POA: Diagnosis not present

## 2015-11-30 LAB — CBC WITH DIFFERENTIAL/PLATELET
BASOS ABS: 0 10*3/uL (ref 0.0–0.1)
BASOS PCT: 1 %
EOS ABS: 0.1 10*3/uL (ref 0.0–0.7)
EOS PCT: 2 %
HCT: 40.2 % (ref 36.0–46.0)
Hemoglobin: 13.6 g/dL (ref 12.0–15.0)
Lymphocytes Relative: 40 %
Lymphs Abs: 2.3 10*3/uL (ref 0.7–4.0)
MCH: 29.2 pg (ref 26.0–34.0)
MCHC: 33.8 g/dL (ref 30.0–36.0)
MCV: 86.5 fL (ref 78.0–100.0)
MONO ABS: 0.4 10*3/uL (ref 0.1–1.0)
Monocytes Relative: 7 %
NEUTROS ABS: 2.9 10*3/uL (ref 1.7–7.7)
Neutrophils Relative %: 50 %
PLATELETS: 236 10*3/uL (ref 150–400)
RBC: 4.65 MIL/uL (ref 3.87–5.11)
RDW: 13.3 % (ref 11.5–15.5)
WBC: 5.8 10*3/uL (ref 4.0–10.5)

## 2015-11-30 LAB — BASIC METABOLIC PANEL
Anion gap: 5 (ref 5–15)
BUN: 11 mg/dL (ref 6–20)
CHLORIDE: 104 mmol/L (ref 101–111)
CO2: 28 mmol/L (ref 22–32)
CREATININE: 0.74 mg/dL (ref 0.44–1.00)
Calcium: 9.4 mg/dL (ref 8.9–10.3)
GFR calc Af Amer: >60 mL/min (ref >60)
GFR calc non Af Amer: >60 mL/min (ref >60)
Glucose, Bld: 89 mg/dL (ref 65–99)
Potassium: 4.5 mmol/L (ref 3.5–5.1)
SODIUM: 137 mmol/L (ref 135–145)

## 2015-11-30 NOTE — Progress Notes (Signed)
Boost drink given with instructions to complete by 1000am, pt verbalized understanding.

## 2015-12-01 ENCOUNTER — Ambulatory Visit
Admission: RE | Admit: 2015-12-01 | Discharge: 2015-12-01 | Disposition: A | Discharge status: Home / Self Care | Payer: BC Managed Care – PPO | Source: Ambulatory Visit | Attending: General Surgery | Admitting: General Surgery

## 2015-12-01 DIAGNOSIS — N6099 Unspecified benign mammary dysplasia of unspecified breast: Secondary | ICD-10-CM

## 2015-12-04 ENCOUNTER — Encounter (HOSPITAL_BASED_OUTPATIENT_CLINIC_OR_DEPARTMENT_OTHER)
Admission: RE | Disposition: A | Discharge status: Home / Self Care | Payer: Self-pay | Source: Ambulatory Visit | Attending: General Surgery

## 2015-12-04 ENCOUNTER — Ambulatory Visit (HOSPITAL_BASED_OUTPATIENT_CLINIC_OR_DEPARTMENT_OTHER)
Admission: RE | Admit: 2015-12-04 | Discharge: 2015-12-04 | Disposition: A | Discharge status: Home / Self Care | Payer: BC Managed Care – PPO | Source: Ambulatory Visit | Attending: General Surgery | Admitting: General Surgery

## 2015-12-04 ENCOUNTER — Ambulatory Visit
Admission: RE | Admit: 2015-12-04 | Discharge: 2015-12-04 | Disposition: A | Discharge status: Home / Self Care | Payer: BC Managed Care – PPO | Source: Ambulatory Visit | Attending: General Surgery | Admitting: General Surgery

## 2015-12-04 ENCOUNTER — Encounter (HOSPITAL_BASED_OUTPATIENT_CLINIC_OR_DEPARTMENT_OTHER): Payer: Self-pay | Admitting: Anesthesiology

## 2015-12-04 ENCOUNTER — Ambulatory Visit (HOSPITAL_BASED_OUTPATIENT_CLINIC_OR_DEPARTMENT_OTHER): Payer: BC Managed Care – PPO | Admitting: Anesthesiology

## 2015-12-04 DIAGNOSIS — N6091 Unspecified benign mammary dysplasia of right breast: Secondary | ICD-10-CM | POA: Insufficient documentation

## 2015-12-04 DIAGNOSIS — M1990 Unspecified osteoarthritis, unspecified site: Secondary | ICD-10-CM | POA: Insufficient documentation

## 2015-12-04 DIAGNOSIS — K219 Gastro-esophageal reflux disease without esophagitis: Secondary | ICD-10-CM | POA: Insufficient documentation

## 2015-12-04 DIAGNOSIS — N6099 Unspecified benign mammary dysplasia of unspecified breast: Secondary | ICD-10-CM

## 2015-12-04 HISTORY — DX: Nausea with vomiting, unspecified: R11.2

## 2015-12-04 HISTORY — DX: Dizziness and giddiness: R42

## 2015-12-04 HISTORY — DX: Unspecified lump in the right breast, unspecified quadrant: N63.10

## 2015-12-04 HISTORY — DX: Nausea with vomiting, unspecified: Z98.890

## 2015-12-04 HISTORY — PX: RADIOACTIVE SEED GUIDED EXCISIONAL BREAST BIOPSY: SHX6490

## 2015-12-04 HISTORY — PX: BREAST LUMPECTOMY: SHX2

## 2015-12-04 SURGERY — RADIOACTIVE SEED GUIDED BREAST BIOPSY
Anesthesia: General | Site: Breast | Laterality: Right

## 2015-12-04 MED ORDER — LIDOCAINE 2% (20 MG/ML) 5 ML SYRINGE
INTRAMUSCULAR | Status: DC | PRN
  Administered 2015-12-04: 100 mg via INTRAVENOUS

## 2015-12-04 MED ORDER — LIDOCAINE 2% (20 MG/ML) 5 ML SYRINGE
INTRAMUSCULAR | Status: AC
  Filled 2015-12-04: qty 5

## 2015-12-04 MED ORDER — EPHEDRINE 5 MG/ML INJ
INTRAVENOUS | Status: AC
  Filled 2015-12-04: qty 10

## 2015-12-04 MED ORDER — ONDANSETRON HCL 4 MG/2ML IJ SOLN
INTRAMUSCULAR | Status: AC
  Filled 2015-12-04: qty 2

## 2015-12-04 MED ORDER — GLYCOPYRROLATE 0.2 MG/ML IJ SOLN: 0.2000 mg | Freq: Once | INTRAMUSCULAR | Status: DC | PRN

## 2015-12-04 MED ORDER — FENTANYL CITRATE (PF) 100 MCG/2ML IJ SOLN
INTRAMUSCULAR | Status: AC
  Filled 2015-12-04: qty 2

## 2015-12-04 MED ORDER — DEXAMETHASONE SODIUM PHOSPHATE 10 MG/ML IJ SOLN
INTRAMUSCULAR | Status: AC
  Filled 2015-12-04: qty 1

## 2015-12-04 MED ORDER — FENTANYL CITRATE (PF) 100 MCG/2ML IJ SOLN
25.0000 ug | INTRAMUSCULAR | Status: DC | PRN
  Administered 2015-12-04 (×2): 50 ug via INTRAVENOUS

## 2015-12-04 MED ORDER — MIDAZOLAM HCL 2 MG/2ML IJ SOLN
INTRAMUSCULAR | Status: AC
  Filled 2015-12-04: qty 2

## 2015-12-04 MED ORDER — HYDROCODONE-ACETAMINOPHEN 10-325 MG PO TABS: 1.0000 | ORAL_TABLET | Freq: Four times a day (QID) | ORAL | 0 refills | Status: DC | PRN

## 2015-12-04 MED ORDER — BUPIVACAINE HCL (PF) 0.25 % IJ SOLN
INTRAMUSCULAR | Status: DC | PRN
  Administered 2015-12-04: 10 mL

## 2015-12-04 MED ORDER — SCOPOLAMINE 1 MG/3DAYS TD PT72
1.0000 | MEDICATED_PATCH | Freq: Once | TRANSDERMAL | Status: DC | PRN
Start: 2015-12-04 — End: 2015-12-04

## 2015-12-04 MED ORDER — CEFAZOLIN SODIUM-DEXTROSE 2-4 GM/100ML-% IV SOLN
2.0000 g | INTRAVENOUS | Status: AC
  Administered 2015-12-04: 2 g via INTRAVENOUS

## 2015-12-04 MED ORDER — LACTATED RINGERS IV SOLN
INTRAVENOUS | Status: DC
  Administered 2015-12-04: 10 mL/h via INTRAVENOUS
  Administered 2015-12-04: 12:00:00 via INTRAVENOUS

## 2015-12-04 MED ORDER — ONDANSETRON HCL 4 MG/2ML IJ SOLN
INTRAMUSCULAR | Status: DC | PRN
  Administered 2015-12-04: 4 mg via INTRAVENOUS

## 2015-12-04 MED ORDER — PROPOFOL 500 MG/50ML IV EMUL
INTRAVENOUS | Status: AC
  Filled 2015-12-04: qty 50

## 2015-12-04 MED ORDER — PROPOFOL 10 MG/ML IV BOLUS
INTRAVENOUS | Status: DC | PRN
  Administered 2015-12-04: 200 mg via INTRAVENOUS

## 2015-12-04 MED ORDER — MIDAZOLAM HCL 2 MG/2ML IJ SOLN
1.0000 mg | INTRAMUSCULAR | Status: DC | PRN
  Administered 2015-12-04: 2 mg via INTRAVENOUS

## 2015-12-04 MED ORDER — PROMETHAZINE HCL 25 MG/ML IJ SOLN: 6.2500 mg | INTRAMUSCULAR | Status: DC | PRN

## 2015-12-04 MED ORDER — CEFAZOLIN SODIUM-DEXTROSE 2-4 GM/100ML-% IV SOLN
INTRAVENOUS | Status: AC
  Filled 2015-12-04: qty 100

## 2015-12-04 MED ORDER — EPHEDRINE SULFATE-NACL 50-0.9 MG/10ML-% IV SOSY
PREFILLED_SYRINGE | INTRAVENOUS | Status: DC | PRN
  Administered 2015-12-04: 10 mg via INTRAVENOUS

## 2015-12-04 MED ORDER — DEXAMETHASONE SODIUM PHOSPHATE 4 MG/ML IJ SOLN
INTRAMUSCULAR | Status: DC | PRN
  Administered 2015-12-04: 10 mg via INTRAVENOUS

## 2015-12-04 MED ORDER — FENTANYL CITRATE (PF) 100 MCG/2ML IJ SOLN
50.0000 ug | INTRAMUSCULAR | Status: DC | PRN
  Administered 2015-12-04: 100 ug via INTRAVENOUS

## 2015-12-04 SURGICAL SUPPLY — 57 items
BINDER BREAST LRG (GAUZE/BANDAGES/DRESSINGS) ×3 IMPLANT
BINDER BREAST MEDIUM (GAUZE/BANDAGES/DRESSINGS) IMPLANT
BINDER BREAST XLRG (GAUZE/BANDAGES/DRESSINGS) IMPLANT
BINDER BREAST XXLRG (GAUZE/BANDAGES/DRESSINGS) IMPLANT
BLADE SURG 15 STRL LF DISP TIS (BLADE) ×1 IMPLANT
BLADE SURG 15 STRL SS (BLADE) ×2
CANISTER SUC SOCK COL 7IN (MISCELLANEOUS) IMPLANT
CANISTER SUCT 1200ML W/VALVE (MISCELLANEOUS) IMPLANT
CHLORAPREP W/TINT 26ML (MISCELLANEOUS) ×3 IMPLANT
CLIP TI WIDE RED SMALL 6 (CLIP) ×3 IMPLANT
CLOSURE WOUND 1/2 X4 (GAUZE/BANDAGES/DRESSINGS) ×1
COVER BACK TABLE 60X90IN (DRAPES) ×3 IMPLANT
COVER MAYO STAND STRL (DRAPES) ×3 IMPLANT
COVER PROBE W GEL 5X96 (DRAPES) ×3 IMPLANT
DECANTER SPIKE VIAL GLASS SM (MISCELLANEOUS) ×3 IMPLANT
DEVICE DUBIN W/COMP PLATE 8390 (MISCELLANEOUS) ×3 IMPLANT
DRAPE LAPAROSCOPIC ABDOMINAL (DRAPES) ×3 IMPLANT
DRAPE UTILITY XL STRL (DRAPES) ×3 IMPLANT
DRSG TEGADERM 4X4.75 (GAUZE/BANDAGES/DRESSINGS) IMPLANT
ELECT COATED BLADE 2.86 ST (ELECTRODE) ×3 IMPLANT
ELECT REM PT RETURN 9FT ADLT (ELECTROSURGICAL) ×3
ELECTRODE REM PT RTRN 9FT ADLT (ELECTROSURGICAL) ×1 IMPLANT
GLOVE BIO SURGEON STRL SZ7 (GLOVE) ×6 IMPLANT
GLOVE BIOGEL PI IND STRL 7.0 (GLOVE) ×2 IMPLANT
GLOVE BIOGEL PI IND STRL 7.5 (GLOVE) ×1 IMPLANT
GLOVE BIOGEL PI INDICATOR 7.0 (GLOVE) ×4
GLOVE BIOGEL PI INDICATOR 7.5 (GLOVE) ×2
GLOVE ECLIPSE 6.5 STRL STRAW (GLOVE) ×3 IMPLANT
GOWN STRL REUS W/ TWL LRG LVL3 (GOWN DISPOSABLE) ×2 IMPLANT
GOWN STRL REUS W/TWL LRG LVL3 (GOWN DISPOSABLE) ×4
ILLUMINATOR WAVEGUIDE N/F (MISCELLANEOUS) IMPLANT
KIT MARKER MARGIN INK (KITS) ×3 IMPLANT
LIGHT WAVEGUIDE WIDE FLAT (MISCELLANEOUS) IMPLANT
LIQUID BAND (GAUZE/BANDAGES/DRESSINGS) ×3 IMPLANT
NEEDLE HYPO 25X1 1.5 SAFETY (NEEDLE) ×3 IMPLANT
NS IRRIG 1000ML POUR BTL (IV SOLUTION) ×3 IMPLANT
PACK BASIN DAY SURGERY FS (CUSTOM PROCEDURE TRAY) ×3 IMPLANT
PENCIL BUTTON HOLSTER BLD 10FT (ELECTRODE) ×3 IMPLANT
SHEET MEDIUM DRAPE 40X70 STRL (DRAPES) IMPLANT
SLEEVE SCD COMPRESS KNEE MED (MISCELLANEOUS) ×3 IMPLANT
SPONGE GAUZE 4X4 12PLY STER LF (GAUZE/BANDAGES/DRESSINGS) IMPLANT
SPONGE LAP 4X18 X RAY DECT (DISPOSABLE) ×3 IMPLANT
STRIP CLOSURE SKIN 1/2X4 (GAUZE/BANDAGES/DRESSINGS) ×2 IMPLANT
SUT MNCRL AB 4-0 PS2 18 (SUTURE) IMPLANT
SUT MON AB 5-0 PS2 18 (SUTURE) ×3 IMPLANT
SUT SILK 2 0 SH (SUTURE) IMPLANT
SUT VIC AB 2-0 SH 27 (SUTURE) ×2
SUT VIC AB 2-0 SH 27XBRD (SUTURE) ×1 IMPLANT
SUT VIC AB 3-0 SH 27 (SUTURE) ×2
SUT VIC AB 3-0 SH 27X BRD (SUTURE) ×1 IMPLANT
SUT VIC AB 5-0 PS2 18 (SUTURE) IMPLANT
SYR CONTROL 10ML LL (SYRINGE) ×3 IMPLANT
TOWEL OR 17X24 6PK STRL BLUE (TOWEL DISPOSABLE) ×3 IMPLANT
TOWEL OR NON WOVEN STRL DISP B (DISPOSABLE) ×3 IMPLANT
TUBE CONNECTING 20'X1/4 (TUBING)
TUBE CONNECTING 20X1/4 (TUBING) IMPLANT
YANKAUER SUCT BULB TIP NO VENT (SUCTIONS) IMPLANT

## 2015-12-04 NOTE — Interval H&P Note (Signed)
History and Physical Interval Note:  12/04/2015 1:16 PM  Yvonne Moore  has presented today for surgery, with the diagnosis of right breast mass  The various methods of treatment have been discussed with the patient and family. After consideration of risks, benefits and other options for treatment, the patient has consented to  Procedure(s): RADIOACTIVE SEED GUIDED EXCISIONAL BREAST BIOPSY (Right) as a surgical intervention .  The patient's history has been reviewed, patient examined, no change in status, stable for surgery.  I have reviewed the patient's chart and labs.  Questions were answered to the patient's satisfaction.     Angelissa Supan

## 2015-12-04 NOTE — Anesthesia Preprocedure Evaluation (Signed)
Anesthesia Evaluation  Patient identified by MRN, date of birth, ID band Patient awake    Reviewed: Allergy & Precautions, H&P , NPO status , Patient's Chart, lab work & pertinent test results  History of Anesthesia Complications (+) PONVNegative for: history of anesthetic complications  Airway Mallampati: II  TM Distance: >3 FB Neck ROM: full    Dental no notable dental hx.    Pulmonary neg pulmonary ROS,    Pulmonary exam normal breath sounds clear to auscultation       Cardiovascular negative cardio ROS Normal cardiovascular exam Rhythm:regular Rate:Normal     Neuro/Psych PSYCHIATRIC DISORDERS negative neurological ROS     GI/Hepatic Neg liver ROS, GERD  ,  Endo/Other  negative endocrine ROS  Renal/GU negative Renal ROS     Musculoskeletal  (+) Arthritis ,   Abdominal   Peds  Hematology negative hematology ROS (+)   Anesthesia Other Findings   Reproductive/Obstetrics negative OB ROS                             Anesthesia Physical Anesthesia Plan  ASA: II  Anesthesia Plan: General   Post-op Pain Management:    Induction: Intravenous  Airway Management Planned: LMA  Additional Equipment:   Intra-op Plan:   Post-operative Plan:   Informed Consent: I have reviewed the patients History and Physical, chart, labs and discussed the procedure including the risks, benefits and alternatives for the proposed anesthesia with the patient or authorized representative who has indicated his/her understanding and acceptance.   Dental Advisory Given  Plan Discussed with: Anesthesiologist, CRNA and Surgeon  Anesthesia Plan Comments:         Anesthesia Quick Evaluation

## 2015-12-04 NOTE — Anesthesia Procedure Notes (Signed)
Procedure Name: LMA Insertion Date/Time: 12/04/2015 1:37 PM Performed by: Gar Gibbon Pre-anesthesia Checklist: Patient identified, Emergency Drugs available, Suction available and Patient being monitored Patient Re-evaluated:Patient Re-evaluated prior to inductionOxygen Delivery Method: Circle system utilized Preoxygenation: Pre-oxygenation with 100% oxygen Intubation Type: IV induction Ventilation: Mask ventilation without difficulty LMA: LMA inserted LMA Size: 4.0 Number of attempts: 1 Airway Equipment and Method: Bite block Placement Confirmation: positive ETCO2 Tube secured with: Tape Dental Injury: Teeth and Oropharynx as per pre-operative assessment

## 2015-12-04 NOTE — Discharge Instructions (Signed)
°Post Anesthesia Home Care Instructions ° °Activity: °Get plenty of rest for the remainder of the day. A responsible adult should stay with you for 24 hours following the procedure.  °For the next 24 hours, DO NOT: °-Drive a car °-Operate machinery °-Drink alcoholic beverages °-Take any medication unless instructed by your physician °-Make any legal decisions or sign important papers. ° °Meals: °Start with liquid foods such as gelatin or soup. Progress to regular foods as tolerated. Avoid greasy, spicy, heavy foods. If nausea and/or vomiting occur, drink only clear liquids until the nausea and/or vomiting subsides. Call your physician if vomiting continues. ° °Special Instructions/Symptoms: °Your throat may feel dry or sore from the anesthesia or the breathing tube placed in your throat during surgery. If this causes discomfort, gargle with warm salt water. The discomfort should disappear within 24 hours. ° °If you had a scopolamine patch placed behind your ear for the management of post- operative nausea and/or vomiting: ° °1. The medication in the patch is effective for 72 hours, after which it should be removed.  Wrap patch in a tissue and discard in the trash. Wash hands thoroughly with soap and water. °2. You may remove the patch earlier than 72 hours if you experience unpleasant side effects which may include dry mouth, dizziness or visual disturbances. °3. Avoid touching the patch. Wash your hands with soap and water after contact with the patch. °  ° ° ° ° °[facility] °Office Phone Number [phone] ° ° POST OP INSTRUCTIONS ° °Always review your discharge instruction sheet given to you by the facility where your surgery was performed. ° °IF YOU HAVE DISABILITY OR FAMILY LEAVE FORMS, YOU MUST BRING THEM TO THE OFFICE FOR PROCESSING.  DO NOT GIVE THEM TO YOUR DOCTOR. ° °1. A prescription for pain medication may be given to you upon discharge.  Take your pain medication as prescribed, if  needed.  If narcotic pain medicine is not needed, then you may take acetaminophen (Tylenol), naprosyn (Alleve) or ibuprofen (Advil) as needed. °2. Take your usually prescribed medications unless otherwise directed °3. If you need a refill on your pain medication, please contact your pharmacy.  They will contact our office to request authorization.  Prescriptions will not be filled after 5pm or on week-ends. °4. You should eat very light the first 24 hours after surgery, such as soup, crackers, pudding, etc.  Resume your normal diet the day after surgery. °5. Most patients will experience some swelling and bruising in the breast.  Ice packs and a good support bra will help.  Wear the breast binder provided or a sports bra for 72 hours day and night.  After that wear a sports bra during the day until you return to the office. Swelling and bruising can take several days to resolve.  °6. It is common to experience some constipation if taking pain medication after surgery.  Increasing fluid intake and taking a stool softener will usually help or prevent this problem from occurring.  A mild laxative (Milk of Magnesia or Miralax) should be taken according to package directions if there are no bowel movements after 48 hours. °7. Unless discharge instructions indicate otherwise, you may remove your bandages 48 hours after surgery and you may shower at that time.  You may have steri-strips (small skin tapes) in place directly over the incision.  These strips should be left on the skin for 7-10 days and will come off on their own.  If your surgeon used   skin glue on the incision, you may shower in 24 hours.  The glue will flake off over the next 2-3 weeks.  Any sutures or staples will be removed at the office during your follow-up visit. °8. ACTIVITIES:  You may resume regular daily activities (gradually increasing) beginning the next day.  Wearing a good support bra or sports bra minimizes pain and swelling.  You may have  sexual intercourse when it is comfortable. °a. You may drive when you no longer are taking prescription pain medication, you can comfortably wear a seatbelt, and you can safely maneuver your car and apply brakes. °b. RETURN TO WORK:  ______________________________________________________________________________________ °9. You should see your doctor in the office for a follow-up appointment approximately two weeks after your surgery.  Your doctor’s nurse will typically make your follow-up appointment when she calls you with your pathology report.  Expect your pathology report 3-4 business days after your surgery.  You may call to check if you do not hear from us after three days. °10. OTHER INSTRUCTIONS: _______________________________________________________________________________________________ _____________________________________________________________________________________________________________________________________ °_____________________________________________________________________________________________________________________________________ °_____________________________________________________________________________________________________________________________________ ° °WHEN TO CALL DR WAKEFIELD: °1. Fever over 101.0 °2. Nausea and/or vomiting. °3. Extreme swelling or bruising. °4. Continued bleeding from incision. °5. Increased pain, redness, or drainage from the incision. ° °The clinic staff is available to answer your questions during regular business hours.  Please don’t hesitate to call and ask to speak to one of the nurses for clinical concerns.  If you have a medical emergency, go to the nearest emergency room or call 911.  A surgeon from [facility] is always on call at the hospital. ° °For further questions, please visit [email or web address] ° °

## 2015-12-04 NOTE — Op Note (Signed)
Preoperative diagnoses: Right breast mammographic distortion with core biopsy showing ADH Postoperative diagnosis: Same as above Procedure Right breast seed guided excisional biopsy Surgeon: Dr. Harden Mo Anesthesia: Gen. Estimated blood loss: Minimal Complications: None Drains: None Specimens:right breast tissue marked with paint Sponge and needle count correct at completion Disposition to recovery stable  Indications: This is a 56 yof who has a right sided mm mass with biopsy showing ADH. Marland Kitchen We discussed seed guided excision of ADH. She had seed placed prior to surgery. I had her mm in the OR.   Procedure:After informed consent was obtained she was then taken to the operating room. She was given cefazolin. Sequential compression devices on her legs. She was placed under general anesthesia without complication. Her right breast was then prepped and draped in the standard sterile surgical fashion. A surgical timeout was then performed.  I located the radioactive seed with the neoprobe.I infiltrated marcaine in the periareolar area and then made a periareolar incision. I then used the neoprobe to guide the excision of the seed and surrounding tissue. The seed came out of the lesion and was sent separately.  She had a lot of scarring from prior reduction.  This was confirmed by the neoprobe. This was painted. This was then taken for mammogram which confirmed removal of the clip and the seed separately. This was confirmed by radiology. This was then sent to pathology. Hemostasis was observed. I closed the breast tissue with a 2-0 Vicryl. The dermis was closed with 3-0 Vicryl and the skin with 5-0 Monocryl.Dermabond and steristrips were placed on the incision. I also sharply removed a right axillary skin tag.  She was transferred to recovery stable

## 2015-12-04 NOTE — H&P (Signed)
55 yof who is otherwise healthy presents after undergoing screening mm. she is referred by Dr Frederico Hamman. she has no breast complaints ( no dc or mass on exam). her density is C. she also has significant itching constantly bilateral nipples but no crusting or dc present. this has been occurring since 2012. the right breast on her screening mm shows possible mass and calcifications. additional views shows small 4 mm group of calcifications. stereo biopsy with clip placement was done. path is adh with microcalcs. she is referred for options. I also calculated her lifetime risk using tyrer-cuzick model and it is 38.4%.    Other Problems  Anxiety Disorder Arthritis Back Pain Gastroesophageal Reflux Disease Lump In Breast  Past Surgical History  Breast Biopsy Right. Mammoplasty; Reduction Bilateral. Oral Surgery Shoulder Surgery Left. Spinal Surgery - Neck Tonsillectomy  Diagnostic Studies History  Colonoscopy 1-5 years ago Mammogram within last year Pap Smear 1-5 years ago  Allergies  No Known Drug Allergies06/30/2017  Medication History ALPRAZolam (0.25MG  Tablet, Oral) Active. Diclofenac Sodium (1% Gel, Transdermal) Active. Cyclobenzaprine HCl (10MG  Tablet, Oral) Active. Sertraline HCl (50MG  Tablet, Oral) Active. Omeprazole (20MG  Capsule DR, Oral as needed) Active. Medications Reconciled  Social History  Alcohol use Occasional alcohol use. Caffeine use Carbonated beverages, Coffee. No drug use Tobacco use Former smoker.  Family History Alcohol Abuse Brother, Father. Arthritis Family Members In General, Mother. Cancer Father. Cerebrovascular Accident Family Members In General. Colon Cancer Family Members In General. Colon Polyps Father, Mother. Depression Mother. Hypertension Brother, Family Members In [place], Father, Mother. Kidney Disease Mother. Melanoma Family Members In General. Respiratory Condition Family  Members In General, Father.  Pregnancy / Birth History Age at menarche 13 years. Contraceptive History Oral contraceptives. Gravida 1 Irregular periods Maternal age 33-25 Para 1  Review of Systems General Present- Fatigue, Night Sweats and Weight Gain. Not Present- Appetite Loss, Chills, Fever and Weight Loss. Skin Not Present- Change in Wart/Mole, Dryness, Hives, Jaundice, New Lesions, Non-Healing Wounds, Rash and Ulcer. HEENT Present- Ringing in the Ears. Not Present- Earache, Hearing Loss, Hoarseness, Nose Bleed, Oral Ulcers, Seasonal Allergies, Sinus Pain, Sore Throat, Visual Disturbances, Wears glasses/contact lenses and Yellow Eyes. Breast Present- Breast Mass. Not Present- Breast Pain, Nipple Discharge and Skin Changes. Cardiovascular Present- Swelling of Extremities. Not Present- Chest Pain, Difficulty Breathing Lying Down, Leg Cramps, Palpitations, Rapid Heart Rate and Shortness of Breath. Gastrointestinal Not Present- Abdominal Pain, Bloating, Bloody Stool, Change in Bowel Habits, Chronic diarrhea, Constipation, Difficulty Swallowing, Excessive gas, Gets full quickly at meals, Hemorrhoids, Indigestion, Nausea, Rectal Pain and Vomiting. Female Genitourinary Not Present- Frequency, Nocturia, Painful Urination, Pelvic Pain and Urgency. Musculoskeletal Present- Back Pain, Joint Pain, Joint Stiffness and Muscle Pain. Not Present- Muscle Weakness and Swelling of Extremities. Neurological Not Present- Decreased Memory, Fainting, Headaches, Numbness, Seizures, Tingling, Tremor, Trouble walking and Weakness. Psychiatric Present- Anxiety. Not Present- Bipolar, Change in Sleep Pattern, Depression, Fearful and Frequent crying. Endocrine Present- Hot flashes. Not Present- Cold Intolerance, Excessive Hunger, Hair Changes, Heat Intolerance and New Diabetes. Hematology Not Present- Blood Thinners, Easy Bruising, Excessive bleeding, Gland problems, HIV and Persistent Infections.  Vitals   Weight: 195 lb Height: 65in Body Surface Area: 1.96 m Body Mass Index: 32.45 kg/m  Temp.: 97.72F(Temporal)  Pulse: 81 (Regular)  BP: 130/74 (Sitting, Left Arm, Standard)  Physical Exam  General Mental Status-Alert. Orientation-Oriented X3. Chest and Lung Exam Chest and lung exam reveals -on auscultation, normal breath sounds, no adventitious sounds and normal vocal resonance. Breast Nipples-No Discharge. Breast Lump-No  Palpable Breast Mass. Note: there is no evidence discharge, crusting or any other nipple abnormality on exam Right axillary skin tag Cardiovascular Cardiovascular examination reveals -normal heart sounds, regular rate and rhythm with no murmurs. Lymphatic Head & Neck General Head & Neck Lymphatics: Bilateral - Description - Normal. Axillary General Axillary Region: Bilateral - Description - Normal. Note: no [place] adenopathy   Assessment & Plan  ATYPICAL DUCTAL HYPERPLASIA OF RIGHT BREAST (N60.91) Story: Right breast seed guided excisional biopsy We discussed mr first due to nac complaints, dense breasts and her nipple complaints as well as the recent dx of adh. once we get that will plan on right breast seed guided excision of the area of adh. we discussed possibility of upgrading this lesion as reason for surgery. we discussed surgery, risks and recovery. Mri showed no other abnl.  She also has right axillary skin tag that is irritating that we will remove today

## 2015-12-04 NOTE — Anesthesia Postprocedure Evaluation (Signed)
Anesthesia Post Note  Patient: Yvonne Moore  Procedure(s) Performed: Procedure(s) (LRB): RADIOACTIVE SEED GUIDED EXCISIONAL BREAST BIOPSY (Right)  Patient location during evaluation: PACU Anesthesia Type: General Level of consciousness: awake and alert Pain management: pain level controlled Vital Signs Assessment: post-procedure vital signs reviewed and stable Respiratory status: spontaneous breathing, nonlabored ventilation, respiratory function stable and patient connected to nasal cannula oxygen Cardiovascular status: blood pressure returned to baseline and stable Postop Assessment: no signs of nausea or vomiting Anesthetic complications: no    Last Vitals:  Vitals:   12/04/15 1515 12/04/15 1604  BP: 137/78 127/76  Pulse: 69 73  Resp: 11 16  Temp:      Last Pain:  Vitals:   12/04/15 1530  TempSrc:   PainSc: 1                  Reino Kent

## 2015-12-04 NOTE — Transfer of Care (Signed)
Immediate Anesthesia Transfer of Care Note  Patient: Yvonne Moore  Procedure(s) Performed: Procedure(s): RADIOACTIVE SEED GUIDED EXCISIONAL BREAST BIOPSY (Right)  Patient Location: PACU  Anesthesia Type:General  Level of Consciousness: awake, sedated and patient cooperative  Airway & Oxygen Therapy: Patient Spontanous Breathing and Patient connected to face mask oxygen  Post-op Assessment: Report given to RN and Post -op Vital signs reviewed and stable  Post vital signs: Reviewed and stable  Last Vitals:  Vitals:   12/04/15 1110  BP: (!) 145/86  Pulse: 61  Resp: 18  Temp: 36.7 C    Last Pain:  Vitals:   12/04/15 1110  TempSrc: Oral         Complications: No apparent anesthesia complications

## 2015-12-06 ENCOUNTER — Encounter (HOSPITAL_BASED_OUTPATIENT_CLINIC_OR_DEPARTMENT_OTHER): Payer: Self-pay | Admitting: General Surgery

## 2016-01-03 DIAGNOSIS — N6091 Unspecified benign mammary dysplasia of right breast: Secondary | ICD-10-CM | POA: Insufficient documentation

## 2016-01-03 NOTE — Progress Notes (Signed)
[facility]  Telephone:([phone] [phone]  ID: Francesca Oman OB: 1961-03-21  MR#: [id number]  LKG#:[id number]  Patient Care Team: Carollee Leitz, NP as PCP - General (Gerontology)  CHIEF COMPLAINT: Atypical ductal hyperplasia of the right breast.  INTERVAL HISTORY: Patient is a 55 year old female who recently underwent lumpectomy for concern for malignancy, but final pathology revealed an atypical ductal hyperplasia. She is referred to clinic to discuss her breast cancer risk and whether or not to initiate preventative tamoxifen. She currently is anxious but otherwise feels well. She has no neurologic complaints. She denies any recent fevers or illnesses. She has a good appetite and denies weight loss. She has no chest pain or shortness of breath. She denies any nausea, vomiting, constipation, or diarrhea. She has no urinary complaints. Patient otherwise feels well and offers no further specific complaints.  REVIEW OF SYSTEMS:   Review of Systems  Constitutional: Negative.  Negative for fever, malaise/fatigue and weight loss.  Respiratory: Negative.  Negative for cough and shortness of breath.   Cardiovascular: Negative.  Negative for chest pain.  Gastrointestinal: Negative.  Negative for abdominal pain.  Genitourinary: Negative.   Musculoskeletal: Negative.   Neurological: Negative.  Negative for weakness.  Psychiatric/Behavioral: The patient is nervous/anxious.     As per HPI. Otherwise, a complete review of systems is negatve.  PAST MEDICAL HISTORY: Past Medical History:  Diagnosis Date  . Arthritis    hips, low back  . Breast mass, right   . Chicken pox   . GERD (gastroesophageal reflux disease)   . PONV (postoperative nausea and vomiting)   . Vertigo     PAST SURGICAL HISTORY: Past Surgical History:  Procedure Laterality Date  . BREAST BIOPSY Right 10/17/2015   stereo  . BREAST SURGERY Bilateral 2010   Reduction   . CARPAL TUNNEL  RELEASE     Unsure of date and which laterality   . CERVICAL SPINE SURGERY  2012   Discectomy and fusion of C4,5, and 6  . LASIK Bilateral 2001  . RADIOACTIVE SEED GUIDED EXCISIONAL BREAST BIOPSY Right 12/04/2015   Procedure: RADIOACTIVE SEED GUIDED EXCISIONAL BREAST BIOPSY;  Surgeon: Emelia Loron, MD;  Location: [facility];  Service: General;  Laterality: Right;  . REDUCTION MAMMAPLASTY Bilateral 2010  . SHOULDER SURGERY Left 1982   bone spur removed/ bone separation repair  . TONSILLECTOMY AND ADENOIDECTOMY  1967    FAMILY HISTORY: Family History  Problem Relation Age of Onset  . Arthritis Mother   . Hypertension Mother   . Kidney disease Mother   . Alcohol abuse Father   . Arthritis Father   . Lung cancer Father   . Hypertension Father   . Alcohol abuse Brother   . Hypertension Brother   . Arthritis Maternal Aunt   . Arthritis Maternal Uncle   . Lung cancer Maternal Uncle   . Colon cancer Paternal Uncle   . Lung cancer Maternal Grandmother   . Hypertension Maternal Grandmother   . Breast cancer Neg Hx        ADVANCED DIRECTIVES (Y/N):  N   HEALTH MAINTENANCE: Social History  Substance Use Topics  . Smoking status: Never Smoker  . Smokeless tobacco: Never Used  . Alcohol use 0.0 - 0.6 oz/week     Comment: social     Colonoscopy:  PAP:  Bone density:  Lipid panel:  No Known Allergies  Current Outpatient Prescriptions  Medication Sig Dispense Refill  . ALPRAZolam (XANAX) 0.25 MG  tablet Take 1 tablet (0.25 mg total) by mouth as needed for anxiety. 30 tablet 0  . cyclobenzaprine (FLEXERIL) 10 MG tablet TAKE 1 TABLET (10 MG TOTAL) BY MOUTH AT BEDTIME. 30 tablet 0  . diclofenac sodium (VOLTAREN) 1 % GEL Apply 2 g topically 4 (four) times daily. 1 Tube 2  . HYDROcodone-acetaminophen (NORCO) 10-325 MG tablet Take 1 tablet by mouth every 6 (six) hours as needed. 10 tablet 0  . meclizine (ANTIVERT) 25 MG tablet Take 25 mg by mouth 3 (three) times  daily as needed for dizziness.    . Multiple Vitamins-Minerals (WOMENS MULTIVITAMIN PLUS PO) Take by mouth once.    Marland Kitchen. omeprazole (PRILOSEC) 20 MG capsule Take 20 mg by mouth daily.    . sertraline (ZOLOFT) 50 MG tablet      No current facility-administered medications for this visit.     OBJECTIVE: There were no vitals filed for this visit.   There is no height or weight on file to calculate BMI.    ECOG FS:0 - Asymptomatic  General: Well-developed, well-nourished, no acute distress. Eyes: Pink conjunctiva, anicteric sclera. Musculoskeletal: No edema, cyanosis, or clubbing. Neuro: Alert, answering all questions appropriately. Cranial nerves grossly intact. Skin: No rashes or petechiae noted. Psych: Normal affect.   LAB RESULTS:  Lab Results  Component Value Date   NA 137 11/30/2015   K 4.5 11/30/2015   CL 104 11/30/2015   CO2 28 11/30/2015   GLUCOSE 89 11/30/2015   BUN 11 11/30/2015   CREATININE 0.74 11/30/2015   CALCIUM 9.4 11/30/2015   GFRNONAA >60 11/30/2015   GFRAA >60 11/30/2015    Lab Results  Component Value Date   WBC 5.8 11/30/2015   NEUTROABS 2.9 11/30/2015   HGB 13.6 11/30/2015   HCT 40.2 11/30/2015   MCV 86.5 11/30/2015   PLT 236 11/30/2015     STUDIES: No results found.  ASSESSMENT: Atypical ductal hyperplasia of the right breast.  PLAN:    1. Atypical ductal hyperplasia of the right breast:  Patient has no family history of breast cancer and no other significant personal risk factors. Her risk of developing breast cancer on the Dondra SpryGail model was less than 5%.  After lengthy discussion of the benefits and risks of taking prophylactic tamoxifen for 5 years, patient ultimately decided to not take tamoxifen. She states she will further discuss with her husband, and will call clinic if she changes her mind. She expressed understanding that although atypical ductal hyperplasia is not a diagnosis of malignancy, it does put her at increased risk for developing  breast cancer over the general population and that she should continue to be vigilant with her yearly screening mammograms.  Approximately 45 minutes was spent in discussion of which greater than 50% was consultation.  Patient expressed understanding and was in agreement with this plan. She also understands that She can call clinic at any time with any questions, concerns, or complaints.    Jeralyn Ruthsimothy J Finnegan, MD   01/03/2016 11:35 PM

## 2016-01-04 ENCOUNTER — Inpatient Hospital Stay: Payer: BC Managed Care – PPO | Attending: Oncology | Admitting: Oncology

## 2016-01-04 ENCOUNTER — Encounter: Payer: Self-pay | Admitting: Oncology

## 2016-01-04 DIAGNOSIS — Z800 Family history of malignant neoplasm of digestive organs: Secondary | ICD-10-CM | POA: Diagnosis not present

## 2016-01-04 DIAGNOSIS — Z79899 Other long term (current) drug therapy: Secondary | ICD-10-CM

## 2016-01-04 DIAGNOSIS — F419 Anxiety disorder, unspecified: Secondary | ICD-10-CM

## 2016-01-04 DIAGNOSIS — K219 Gastro-esophageal reflux disease without esophagitis: Secondary | ICD-10-CM | POA: Diagnosis not present

## 2016-01-04 DIAGNOSIS — N62 Hypertrophy of breast: Secondary | ICD-10-CM | POA: Insufficient documentation

## 2016-01-04 DIAGNOSIS — M1990 Unspecified osteoarthritis, unspecified site: Secondary | ICD-10-CM | POA: Insufficient documentation

## 2016-01-04 DIAGNOSIS — Z801 Family history of malignant neoplasm of trachea, bronchus and lung: Secondary | ICD-10-CM | POA: Insufficient documentation

## 2016-01-04 DIAGNOSIS — N6091 Unspecified benign mammary dysplasia of right breast: Secondary | ICD-10-CM

## 2016-01-04 NOTE — Progress Notes (Signed)
New evaluation for breast cancer. States is feeling well. Offers no complaints. 

## 2016-01-10 DIAGNOSIS — M79672 Pain in left foot: Secondary | ICD-10-CM | POA: Insufficient documentation

## 2016-02-07 ENCOUNTER — Ambulatory Visit: Payer: BC Managed Care – PPO | Admitting: Nurse Practitioner

## 2016-05-22 ENCOUNTER — Ambulatory Visit: Payer: BC Managed Care – PPO | Admitting: Podiatry

## 2016-05-27 ENCOUNTER — Ambulatory Visit (INDEPENDENT_AMBULATORY_CARE_PROVIDER_SITE_OTHER): Payer: BC Managed Care – PPO

## 2016-05-27 ENCOUNTER — Ambulatory Visit (INDEPENDENT_AMBULATORY_CARE_PROVIDER_SITE_OTHER): Payer: BC Managed Care – PPO | Admitting: Podiatry

## 2016-05-27 VITALS — BP 133/94 | HR 79 | Resp 16

## 2016-05-27 DIAGNOSIS — M722 Plantar fascial fibromatosis: Secondary | ICD-10-CM

## 2016-05-27 MED ORDER — MELOXICAM 15 MG PO TABS: 15.0000 mg | ORAL_TABLET | Freq: Every day | ORAL | 3 refills | Status: DC

## 2016-05-27 MED ORDER — METHYLPREDNISOLONE 4 MG PO TBPK: ORAL_TABLET | ORAL | 0 refills | Status: DC

## 2016-05-27 NOTE — Progress Notes (Signed)
   Subjective:    Patient ID: Yvonne Moore, female    DOB: 19-Jan-1961, 56 y.o.   MRN: [id number]  HPI: She presents today with chief complaint of pain to her left plantar heel 6 months. She states that she has a stabbing shooting pain from time to time and was provided inserts off-the-shelf from a podiatrist in [place]. She is noticed no relief.    Review of Systems  Constitutional: Positive for appetite change.  Musculoskeletal: Positive for arthralgias.  All other systems reviewed and are negative.      Objective:   Physical Exam: Low signs are stable alert and oriented 3 pulses are strongly palpable. Neurologic sensorium is intact percent glycine monofilament. Deep tendon reflexes are intact. Muscle strength is 5 over 5 dorsiflexion 5 over 0 into the musculature is intact. Orthopedic evaluation results once distally, full range of motion without crepitation. She has pain on palpation tubercle of her left heel. Radiographs demonstrate soft tissue increase in density at the plantar fascia calcaneal insertion site left heel. Cutaneous evaluation demonstrates well-hydrated cutis, no open lesions or wounds.        Assessment & Plan:  Plantar fasciitis left.  Plan: Injected her left heel today with Kenalog and local anesthetic. provided her with a Medrol Dosepak to be followed by meloxicam. Provided her with both oral and written instructions. For stretching exercises. Also provided her with a brace and the night splint. Discussed proper shoe history sizes ice therapy and sugar modifications.

## 2016-05-27 NOTE — Patient Instructions (Signed)

## 2016-06-26 ENCOUNTER — Encounter: Payer: Self-pay | Admitting: Podiatry

## 2016-06-26 ENCOUNTER — Ambulatory Visit (INDEPENDENT_AMBULATORY_CARE_PROVIDER_SITE_OTHER): Payer: BC Managed Care – PPO | Admitting: Podiatry

## 2016-06-26 DIAGNOSIS — M722 Plantar fascial fibromatosis: Secondary | ICD-10-CM | POA: Diagnosis not present

## 2016-06-26 NOTE — Progress Notes (Signed)
She presents today for follow-up of her plantar fasciitis left heel. She states this doing better than it was.  Objective: Vital signs are stable she is alert and oriented 3. She has pain on palpation medial calcaneal tubercle of the left heel.  Assessment: Plantar fasciitis left heel.  Plan: Injected left heel again today continue anti-inflammatories plantar fascia brace and night splint. Discussed future follow-up visits will follow up with her in 1 month.

## 2016-07-29 ENCOUNTER — Ambulatory Visit (INDEPENDENT_AMBULATORY_CARE_PROVIDER_SITE_OTHER): Payer: BC Managed Care – PPO | Admitting: Podiatry

## 2016-07-29 ENCOUNTER — Encounter: Payer: Self-pay | Admitting: Podiatry

## 2016-07-29 DIAGNOSIS — M722 Plantar fascial fibromatosis: Secondary | ICD-10-CM | POA: Diagnosis not present

## 2016-07-29 NOTE — Progress Notes (Signed)
She presents today for follow-up of her plantar fasciitis to her left heel. She states that she is nearly 100% improved but not very yet.  Objective: Vital signs are stable she is alert and oriented 3. Pulses are palpable. She has no pain on palpation medial cartilage above the left heel.  Assessment: Nearly 100% resolved plantar fasciitis left.  Plan: Continue all conservative therapies for at least another month and I will follow-up with her at that time if necessary.

## 2016-09-04 ENCOUNTER — Encounter: Payer: Self-pay | Admitting: Podiatry

## 2016-09-05 ENCOUNTER — Telehealth: Payer: Self-pay | Admitting: *Deleted

## 2016-09-05 ENCOUNTER — Telehealth: Payer: Self-pay

## 2016-09-05 MED ORDER — MELOXICAM 15 MG PO TABS: 15.0000 mg | ORAL_TABLET | Freq: Every day | ORAL | 0 refills | Status: DC

## 2016-09-05 NOTE — Telephone Encounter (Signed)
Pt due for screening mammogram 7/18 per last yr's MRI note. She can do at either facility because they will send records. Can put order in at annual. RN to notify pt.

## 2016-09-05 NOTE — Telephone Encounter (Signed)
Pt calling.  She has scheduled her annual and needs direction c scheduling mammogram.  Last yr she had lumpect[place] in [place].  [facility] [place].  [phone]

## 2016-09-05 NOTE — Telephone Encounter (Signed)
Pt communicated that she is taking Meloxicam. I told her I would refill to get to the appt date.

## 2016-09-06 NOTE — Telephone Encounter (Signed)
Pt aware.

## 2016-09-11 ENCOUNTER — Ambulatory Visit (INDEPENDENT_AMBULATORY_CARE_PROVIDER_SITE_OTHER): Payer: BC Managed Care – PPO | Admitting: Podiatry

## 2016-09-11 DIAGNOSIS — M722 Plantar fascial fibromatosis: Secondary | ICD-10-CM

## 2016-09-11 MED ORDER — MELOXICAM 15 MG PO TABS: 15.0000 mg | ORAL_TABLET | Freq: Every day | ORAL | 3 refills | Status: DC

## 2016-09-11 NOTE — Progress Notes (Signed)
She presents today for flare up of her plantar fasciitis of her left foot. She states that it was doing very well and it is really never got 100% and now it has regressed approximately 50% and she refers the left heel.  Objective: Vital signs are stable she is alert and oriented 3. Pulses are palpable. She has pain on palpation be manageable the left heel.  Assessment: Recurrence intractable plantar fasciitis left.  Plan: Injected the area today with Kenalog and local anesthetic. She was measured for orthotics. I will follow up with her once those come in.

## 2016-09-24 ENCOUNTER — Other Ambulatory Visit: Payer: Self-pay | Admitting: Obstetrics and Gynecology

## 2016-09-24 MED ORDER — OMEPRAZOLE 20 MG PO CPDR: 20.0000 mg | DELAYED_RELEASE_CAPSULE | Freq: Every day | ORAL | 0 refills | Status: DC

## 2016-09-24 MED ORDER — SERTRALINE HCL 50 MG PO TABS: 50.0000 mg | ORAL_TABLET | Freq: Every day | ORAL | 0 refills | Status: DC

## 2016-10-21 ENCOUNTER — Other Ambulatory Visit: Payer: Self-pay | Admitting: Obstetrics and Gynecology

## 2016-10-28 ENCOUNTER — Ambulatory Visit (INDEPENDENT_AMBULATORY_CARE_PROVIDER_SITE_OTHER): Payer: BC Managed Care – PPO | Admitting: Obstetrics and Gynecology

## 2016-10-28 ENCOUNTER — Encounter: Payer: Self-pay | Admitting: Obstetrics and Gynecology

## 2016-10-28 VITALS — BP 122/78 | HR 61 | Ht 65.0 in | Wt 193.0 lb

## 2016-10-28 DIAGNOSIS — Z01419 Encounter for gynecological examination (general) (routine) without abnormal findings: Secondary | ICD-10-CM | POA: Diagnosis not present

## 2016-10-28 DIAGNOSIS — Z124 Encounter for screening for malignant neoplasm of cervix: Secondary | ICD-10-CM

## 2016-10-28 DIAGNOSIS — K219 Gastro-esophageal reflux disease without esophagitis: Secondary | ICD-10-CM | POA: Diagnosis not present

## 2016-10-28 DIAGNOSIS — Z1151 Encounter for screening for human papillomavirus (HPV): Secondary | ICD-10-CM | POA: Diagnosis not present

## 2016-10-28 DIAGNOSIS — M542 Cervicalgia: Secondary | ICD-10-CM

## 2016-10-28 DIAGNOSIS — Z13220 Encounter for screening for lipoid disorders: Secondary | ICD-10-CM

## 2016-10-28 DIAGNOSIS — F419 Anxiety disorder, unspecified: Secondary | ICD-10-CM

## 2016-10-28 DIAGNOSIS — Z1239 Encounter for other screening for malignant neoplasm of breast: Secondary | ICD-10-CM

## 2016-10-28 DIAGNOSIS — Z1231 Encounter for screening mammogram for malignant neoplasm of breast: Secondary | ICD-10-CM

## 2016-10-28 DIAGNOSIS — Z0000 Encounter for general adult medical examination without abnormal findings: Secondary | ICD-10-CM

## 2016-10-28 DIAGNOSIS — Z131 Encounter for screening for diabetes mellitus: Secondary | ICD-10-CM

## 2016-10-28 MED ORDER — SERTRALINE HCL 50 MG PO TABS: 50.0000 mg | ORAL_TABLET | Freq: Every day | ORAL | 12 refills | Status: DC

## 2016-10-28 MED ORDER — CYCLOBENZAPRINE HCL 10 MG PO TABS: ORAL_TABLET | ORAL | 1 refills | Status: DC

## 2016-10-28 MED ORDER — OMEPRAZOLE 20 MG PO CPDR: 20.0000 mg | DELAYED_RELEASE_CAPSULE | Freq: Every day | ORAL | 12 refills | Status: DC

## 2016-10-28 MED ORDER — ALPRAZOLAM 0.25 MG PO TABS: 0.2500 mg | ORAL_TABLET | Freq: Two times a day (BID) | ORAL | 0 refills | Status: DC | PRN

## 2016-10-28 NOTE — Progress Notes (Signed)
Chief Complaint  Patient presents with  . Gynecologic Exam    HPI:      [name] is a 56 y.o. No obstetric history on file. who LMP was Patient's last menstrual period was 06/08/2016 (exact date)., presents today for her annual examination.  Her menses are irregular since perimenopausal. Last real menses was 2/18 awith 7 day flow. She had spotting for a few days 5/18. She has gone as long as 8 months without bleeding. Dysmenorrhea none. She does not have intermenstrual bleeding. She does have vasomotor sx.   Sex activity: single partner. She does have vaginal dryness and decreased libido.   Last Pap: 09/23/13  Results were: no abnormalities /neg HPV DNA.  Hx of STDs: none  Last mammogram: October 13, 2015  Results were: abnormal. 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 Dwain Sarna. She is due for yearly mammo this yr.  There is no FH of breast cancer. There is no FH of ovarian cancer. The patient does do self-breast exams.  Colonoscopy: colonoscopy 3 years ago without abnormalities. Repeat in 10 yrs  Tobacco use: The patient denies current or previous tobacco use. Alcohol use: social drinker Exercise: moderately active  She does get adequate calcium and Vitamin D in her diet. She had borderline lipids and HgA1C on labs last yr. She is due for repeat fasting labs. She is on zoloft 50 mg for anxiety with sx control. She doesn't feel she can go off it. No side effects. She also needs a refill on xanax. She uses very sparingly and for specific events.  She takes omeprazole daily with GERD relief. She uses Rx form since cheaper.  She occas takes flexeril for back/neck muscle spasms.    Past Medical History:  Diagnosis Date  . Arthritis    hips, low back  . Breast mass, right   . Chicken pox   . GERD (gastroesophageal reflux disease)   . Plantar fasciitis   . PONV (postoperative nausea and vomiting)   . Vertigo     Past Surgical History:  Procedure  Laterality Date  . BREAST BIOPSY Right 10/17/2015   stereo  . BREAST SURGERY Bilateral 2010   Reduction   . CARPAL TUNNEL RELEASE     Unsure of date and which laterality   . CERVICAL SPINE SURGERY  2012   Discectomy and fusion of C4,5, and 6  . LASIK Bilateral 2001  . RADIOACTIVE SEED GUIDED EXCISIONAL BREAST BIOPSY Right 12/04/2015   Procedure: RADIOACTIVE SEED GUIDED EXCISIONAL BREAST BIOPSY;  Surgeon: Emelia Loron, MD;  Location: [facility];  Service: General;  Laterality: Right;  . REDUCTION MAMMAPLASTY Bilateral 2010  . SHOULDER SURGERY Left 1982   bone spur removed/ bone separation repair  . TONSILLECTOMY AND ADENOIDECTOMY  1967    Family History  Problem Relation Age of Onset  . Arthritis Mother   . Hypertension Mother   . Kidney disease Mother   . Alcohol abuse Father   . Arthritis Father   . Lung cancer Father   . Hypertension Father   . Alcohol abuse Brother   . Hypertension Brother   . Arthritis Maternal Aunt   . Arthritis Maternal Uncle   . Lung cancer Maternal Uncle   . Colon cancer Paternal Uncle   . Lung cancer Maternal Grandmother   . Hypertension Maternal Grandmother   . Breast cancer Neg Hx     Social History   Social History  . Marital status: Divorced  Spouse name: N/A  . Number of children: N/A  . Years of education: N/A   Occupational History  . Not on file.   Social History Main Topics  . Smoking status: Never Smoker  . Smokeless tobacco: Never Used  . Alcohol use 0.0 - 0.6 oz/week     Comment: social  . Drug use: No  . Sexual activity: Yes   Other Topics Concern  . 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  ALPRAZolam (XANAX) 0.25 MG tablet, Take 1 tablet (0.25 mg total) by mouth 2 (two) times daily as needed for anxiety., Disp: 30 tablet, Rfl: 0 .  cyclobenzaprine (FLEXERIL) 10 MG tablet, TAKE 1 TABLET (10 MG  TOTAL) BY MOUTH AT BEDTIME., Disp: 30 tablet, Rfl: 1 .  diclofenac sodium (VOLTAREN) 1 % GEL, Apply 2 g topically 4 (four) times daily., Disp: 1 Tube, Rfl: 2 .  meclizine (ANTIVERT) 25 MG tablet, Take 25 mg by mouth 3 (three) times daily as needed for dizziness., Disp: , Rfl:  .  meloxicam (MOBIC) 15 MG tablet, Take 1 tablet (15 mg total) by mouth daily., Disp: 30 tablet, Rfl: 3 .  Multiple Vitamins-Minerals (WOMENS MULTIVITAMIN PLUS PO), Take by mouth once., Disp: , Rfl:  .  Omega-3 Fatty Acids (FISH OIL) 1000 MG CAPS, Take by mouth., Disp: , Rfl:  .  omeprazole (PRILOSEC) 20 MG capsule, Take 1 capsule (20 mg total) by mouth daily., Disp: 30 capsule, Rfl: 12 .  sertraline (ZOLOFT) 50 MG tablet, Take 1 tablet (50 mg total) by mouth daily., Disp: 30 tablet, Rfl: 12   ROS:  Review of Systems  Constitutional: Negative for fatigue, fever and unexpected weight change.  Respiratory: Negative for cough, shortness of breath and wheezing.   Cardiovascular: Negative for chest pain, palpitations and leg swelling.  Gastrointestinal: Negative for blood in stool, constipation, diarrhea, nausea and vomiting.  Endocrine: Negative for cold intolerance, heat intolerance and polyuria.  Genitourinary: Negative for dyspareunia, dysuria, flank pain, frequency, genital sores, hematuria, menstrual problem, pelvic pain, urgency, vaginal bleeding, vaginal discharge and vaginal pain.  Musculoskeletal: Positive for arthralgias and back pain. Negative for joint swelling and myalgias.  Skin: Negative for rash.  Neurological: Negative for dizziness, syncope, light-headedness, numbness and headaches.  Hematological: Negative for adenopathy.  Psychiatric/Behavioral: Negative for agitation, confusion, sleep disturbance and suicidal ideas. The patient is not nervous/anxious.      Objective: BP 122/78   Pulse 61   Ht 5\' 5"  (1.651 m)   Wt 193 lb (87.5 kg)   LMP 06/08/2016 (Exact Date) Comment: spotted in May for 2 days   BMI 32.12 kg/m    Physical Exam  Constitutional: She is oriented to person, place, and time. She appears well-developed and well-nourished.  Genitourinary: Vagina normal and uterus normal. There is no rash or tenderness on the right labia. There is no rash or tenderness on the left labia. No erythema or tenderness in the vagina. No vaginal discharge found. Right adnexum does not display mass and does not display tenderness. Left adnexum does not display mass and does not display tenderness. Cervix does not exhibit motion tenderness or polyp. Uterus is not enlarged or tender.  Neck: Normal range of motion. No thyromegaly present.  Cardiovascular: Normal rate, regular rhythm and normal heart sounds.   No murmur heard. Pulmonary/Chest: Effort normal and breath sounds normal.  Right breast exhibits no mass, no nipple discharge, no skin change and no tenderness. Left breast exhibits no mass, no nipple discharge, no skin change and no tenderness.  Abdominal: Soft. There is no tenderness. There is no guarding.  Musculoskeletal: Normal range of motion.  Neurological: She is alert and oriented to person, place, and time. No cranial nerve deficit.  Psychiatric: She has a normal mood and affect. Her behavior is normal.  Vitals reviewed.    Assessment/Plan:  Encounter for annual routine gynecological examination  Cervical cancer screening - Plan: IGP, Aptima HPV  Screening for HPV (human papillomavirus) - Plan: IGP, Aptima HPV  Screening for breast cancer - Pt to sched scr mammo. Will do order for dx mammo if that is what radiology prefers.  - Plan: MM SCREENING BREAST TOMO BILATERAL  Anxiety - Rx RF sertraline and xanax. F/u prn. - Plan: ALPRAZolam (XANAX) 0.25 MG tablet, sertraline (ZOLOFT) 50 MG tablet  Neck pain - Rx RF flexeril. Pt takes sparingly.  - Plan: cyclobenzaprine (FLEXERIL) 10 MG tablet  Gastroesophageal reflux disease without esophagitis - Rx RF omeprazole.  - Plan: omeprazole  (PRILOSEC) 20 MG capsule  Blood tests for routine general physical examination - Plan: Comprehensive metabolic panel, Lipid panel, Hemoglobin A1c  Screening cholesterol level - Plan: Lipid panel  Screening for diabetes mellitus - Plan: Hemoglobin A1c          GYN counsel breast self exam, mammography screening, menopause, adequate intake of calcium and vitamin D     F/U  Return in about 1 year (around 10/28/2017).  Yvonne Moore B. Mannat Benedetti, PA-C 10/28/2016 5:05 PM

## 2016-10-29 ENCOUNTER — Other Ambulatory Visit: Payer: BC Managed Care – PPO

## 2016-10-29 DIAGNOSIS — Z131 Encounter for screening for diabetes mellitus: Secondary | ICD-10-CM

## 2016-10-29 DIAGNOSIS — Z0000 Encounter for general adult medical examination without abnormal findings: Secondary | ICD-10-CM

## 2016-10-29 DIAGNOSIS — Z13220 Encounter for screening for lipoid disorders: Secondary | ICD-10-CM

## 2016-10-30 ENCOUNTER — Ambulatory Visit: Payer: BC Managed Care – PPO

## 2016-10-30 LAB — COMPREHENSIVE METABOLIC PANEL
ALT: 23 IU/L (ref 0–32)
AST: 22 IU/L (ref 0–40)
Albumin/Globulin Ratio: 1.8 (ref 1.2–2.2)
Albumin: 4.4 g/dL (ref 3.5–5.5)
Alkaline Phosphatase: 62 IU/L (ref 39–117)
BILIRUBIN TOTAL: 0.2 mg/dL (ref 0.0–1.2)
BUN/Creatinine Ratio: 10 (ref 9–23)
BUN: 8 mg/dL (ref 6–24)
CALCIUM: 9.5 mg/dL (ref 8.7–10.2)
CHLORIDE: 103 mmol/L (ref 96–106)
CO2: 25 mmol/L (ref 20–29)
Creatinine, Ser: 0.78 mg/dL (ref 0.57–1.00)
GFR calc non Af Amer: 86 mL/min/{1.73_m2} (ref >59)
GFR, EST AFRICAN AMERICAN: 100 mL/min/{1.73_m2} (ref >59)
GLUCOSE: 88 mg/dL (ref 65–99)
Globulin, Total: 2.5 g/dL (ref 1.5–4.5)
Potassium: 4.7 mmol/L (ref 3.5–5.2)
Sodium: 142 mmol/L (ref 134–144)
TOTAL PROTEIN: 6.9 g/dL (ref 6.0–8.5)

## 2016-10-30 LAB — LIPID PANEL
Chol/HDL Ratio: 3.2 ratio (ref 0.0–4.4)
Cholesterol, Total: 188 mg/dL (ref 100–199)
HDL: 59 mg/dL (ref >39)
LDL Calculated: 106 mg/dL — ABNORMAL HIGH (ref 0–99)
Triglycerides: 114 mg/dL (ref 0–149)
VLDL CHOLESTEROL CAL: 23 mg/dL (ref 5–40)

## 2016-10-30 LAB — HEMOGLOBIN A1C
Est. average glucose Bld gHb Est-mCnc: 100 mg/dL
HEMOGLOBIN A1C: 5.1 % (ref 4.8–5.6)

## 2016-11-01 LAB — IGP, APTIMA HPV
HPV Aptima: POSITIVE — AB
PAP SMEAR COMMENT: 0

## 2016-11-11 ENCOUNTER — Ambulatory Visit
Admission: RE | Admit: 2016-11-11 | Discharge: 2016-11-11 | Disposition: A | Discharge status: Home / Self Care | Payer: BC Managed Care – PPO | Source: Ambulatory Visit | Attending: Obstetrics and Gynecology | Admitting: Obstetrics and Gynecology

## 2016-11-11 DIAGNOSIS — Z1231 Encounter for screening mammogram for malignant neoplasm of breast: Secondary | ICD-10-CM | POA: Insufficient documentation

## 2016-11-11 DIAGNOSIS — Z1239 Encounter for other screening for malignant neoplasm of breast: Secondary | ICD-10-CM

## 2016-12-05 ENCOUNTER — Encounter: Payer: Self-pay | Admitting: Obstetrics and Gynecology

## 2016-12-06 NOTE — Telephone Encounter (Signed)
E you are leaving

## 2017-01-08 ENCOUNTER — Encounter: Payer: Self-pay | Admitting: Podiatry

## 2017-01-08 ENCOUNTER — Encounter: Payer: Self-pay | Admitting: Obstetrics and Gynecology

## 2017-01-09 ENCOUNTER — Telehealth: Payer: Self-pay | Admitting: *Deleted

## 2017-01-09 MED ORDER — MELOXICAM 15 MG PO TABS: 15.0000 mg | ORAL_TABLET | Freq: Every day | ORAL | 2 refills | Status: DC

## 2017-01-09 NOTE — Telephone Encounter (Signed)
Pt requested refills of Meloxicam until could get established with PCP.

## 2017-05-26 DIAGNOSIS — M79645 Pain in left finger(s): Secondary | ICD-10-CM | POA: Insufficient documentation

## 2017-06-04 ENCOUNTER — Encounter: Payer: Self-pay | Admitting: Podiatry

## 2017-06-04 ENCOUNTER — Ambulatory Visit: Payer: BC Managed Care – PPO | Admitting: Podiatry

## 2017-06-04 DIAGNOSIS — M7752 Other enthesopathy of left foot: Secondary | ICD-10-CM | POA: Diagnosis not present

## 2017-06-04 DIAGNOSIS — M722 Plantar fascial fibromatosis: Secondary | ICD-10-CM | POA: Diagnosis not present

## 2017-06-04 MED ORDER — CELECOXIB 200 MG PO CAPS: 200.0000 mg | ORAL_CAPSULE | Freq: Every day | ORAL | 2 refills | Status: DC

## 2017-06-04 NOTE — Progress Notes (Signed)
She presents today for follow-up of plantar fasciitis.  She states that it started hurting again just about a month ago and I am using the night splint again.  She continues to use her orthotics and regular shoes.  She is also having pain on the dorsal lateral aspect of her left foot right here she points to the fourth fifth metatarsal cuboid articulation.  It is just making my entire foot hurts she says.  Objective: Vital signs are stable alert and oriented x3.  Pulses are palpable.  Neurologic sensorium is intact.  Deep tendon reflexes are intact muscle strength +5/5 dorsiflexors plantar flexors inverters everters all intrinsic musculature is intact.  Orthopedic evaluation demonstrates pain on palpation medial calcaneal tubercle of the left heel.  She also has pain on palpation of the fourth and fifth metatarsal cuboid articulation.  Assessment: Plantar fasciitis with lateral compensatory syndrome and capsulitis of the fourth tarsometatarsal articulation  Plan: After thorough discussion today and consent I injected her left heel today with 20 mg Kenalog 5 mg Marcaine medial aspect of the plantar fascial calcaneal insertion site after sterile Betadine skin prep.  Also injected 2 mg of dexamethasone to the point of maximal tenderness at the fourth and fifth tarsometatarsal articulation.  She tolerated procedure well without complications changed her anti-inflammatory to Celebrex and I encouraged her to continue her current activities.  I will follow-up with her in 1 month.

## 2017-07-16 ENCOUNTER — Ambulatory Visit: Payer: BC Managed Care – PPO | Admitting: Podiatry

## 2017-07-16 ENCOUNTER — Encounter: Payer: Self-pay | Admitting: Podiatry

## 2017-07-16 DIAGNOSIS — M722 Plantar fascial fibromatosis: Secondary | ICD-10-CM

## 2017-07-16 NOTE — Progress Notes (Signed)
She presents today states she is approximately 80-90% improved.  She refers to her plantar fasciitis.  Objective: Vital signs are stable she is alert and oriented x3.  Pulses are palpable.  She still has pain on palpation medial calcaneal tubercle of the left heel.  No open lesions or wounds are noted.  Assessment: Plantar fasciitis 80-90% improved.  Plan: After sterile Betadine skin prep I injected 20 mg Kenalog 5 mg Marcaine point maximal tenderness of the left heel once again.  This should resolve her tenderness.  She will continue use of her orthotics.  Follow-up with me in 1 month if necessary.

## 2017-09-01 ENCOUNTER — Ambulatory Visit: Payer: BC Managed Care – PPO | Admitting: Podiatry

## 2017-09-01 DIAGNOSIS — M722 Plantar fascial fibromatosis: Secondary | ICD-10-CM

## 2017-09-01 MED ORDER — CELECOXIB 200 MG PO CAPS: 200.0000 mg | ORAL_CAPSULE | Freq: Every day | ORAL | 3 refills | Status: DC

## 2017-09-01 NOTE — Progress Notes (Signed)
She presents today for follow-up of plantar fasciitis to the left foot states that is doing much better she is very happy with the outcome of that foot.  She states that her right foot started to act up for the past couple of weeks and she would like to continue her Celebrex.  She states that it really helps a lot.  Objective evaluation reveals vital signs stable alert and oriented x3 pulses are palpable.  No reproducible pain to palpation me to continue typical of the left foot however the right foot does demonstrate tender palpation medial calcaneal tubercle and medial longitudinal arch.  Radiographs of the right foot were not taken.  No open lesions or wounds are noted.  Assessment: Pain in limb secondary to plantar fasciitis right.  Resolution or near resolution of plantar fasciitis left.  Plan: Discussed etiology pathology conservative versus surgical therapies.  After sterile Betadine skin prep I injected 20 mg of Kenalog 5 mg Marcaine to the medial aspect of the right heel.  She tolerated procedure well without complications instructed her to wear her plantar fascial brace that she has on her left foot on her right foot at this point.  Continue use of the orthotics.  Follow-up with me on as-needed basis I refilled her Celebrex.

## 2017-10-13 ENCOUNTER — Other Ambulatory Visit: Payer: Self-pay | Admitting: Obstetrics and Gynecology

## 2017-10-13 DIAGNOSIS — Z1231 Encounter for screening mammogram for malignant neoplasm of breast: Secondary | ICD-10-CM

## 2017-11-04 ENCOUNTER — Other Ambulatory Visit: Payer: Self-pay | Admitting: Obstetrics and Gynecology

## 2017-11-04 DIAGNOSIS — F419 Anxiety disorder, unspecified: Secondary | ICD-10-CM

## 2017-11-04 DIAGNOSIS — K219 Gastro-esophageal reflux disease without esophagitis: Secondary | ICD-10-CM

## 2017-11-04 NOTE — Telephone Encounter (Signed)
Please advise 

## 2017-11-12 ENCOUNTER — Other Ambulatory Visit: Payer: Self-pay | Admitting: Obstetrics and Gynecology

## 2017-11-12 ENCOUNTER — Ambulatory Visit
Admission: RE | Admit: 2017-11-12 | Discharge: 2017-11-12 | Disposition: A | Discharge status: Home / Self Care | Payer: BC Managed Care – PPO | Source: Ambulatory Visit | Attending: Obstetrics and Gynecology | Admitting: Obstetrics and Gynecology

## 2017-11-12 DIAGNOSIS — R921 Mammographic calcification found on diagnostic imaging of breast: Secondary | ICD-10-CM

## 2017-11-12 DIAGNOSIS — Z1231 Encounter for screening mammogram for malignant neoplasm of breast: Secondary | ICD-10-CM | POA: Insufficient documentation

## 2017-11-12 DIAGNOSIS — R928 Other abnormal and inconclusive findings on diagnostic imaging of breast: Secondary | ICD-10-CM

## 2017-11-12 DIAGNOSIS — N6489 Other specified disorders of breast: Secondary | ICD-10-CM

## 2017-11-19 ENCOUNTER — Other Ambulatory Visit: Payer: Self-pay | Admitting: Obstetrics and Gynecology

## 2017-11-19 ENCOUNTER — Ambulatory Visit
Admission: RE | Admit: 2017-11-19 | Discharge: 2017-11-19 | Disposition: A | Discharge status: Home / Self Care | Payer: BC Managed Care – PPO | Source: Ambulatory Visit | Attending: Obstetrics and Gynecology | Admitting: Obstetrics and Gynecology

## 2017-11-19 ENCOUNTER — Encounter: Payer: Self-pay | Admitting: Radiology

## 2017-11-19 DIAGNOSIS — N6489 Other specified disorders of breast: Secondary | ICD-10-CM | POA: Diagnosis present

## 2017-11-19 DIAGNOSIS — R921 Mammographic calcification found on diagnostic imaging of breast: Secondary | ICD-10-CM

## 2017-11-19 DIAGNOSIS — R928 Other abnormal and inconclusive findings on diagnostic imaging of breast: Secondary | ICD-10-CM

## 2017-11-20 ENCOUNTER — Encounter: Payer: Self-pay | Admitting: Obstetrics and Gynecology

## 2017-11-24 ENCOUNTER — Ambulatory Visit
Admission: RE | Admit: 2017-11-24 | Discharge: 2017-11-24 | Disposition: A | Discharge status: Home / Self Care | Payer: BC Managed Care – PPO | Source: Ambulatory Visit | Attending: Obstetrics and Gynecology | Admitting: Obstetrics and Gynecology

## 2017-11-24 DIAGNOSIS — R928 Other abnormal and inconclusive findings on diagnostic imaging of breast: Secondary | ICD-10-CM

## 2017-11-24 DIAGNOSIS — R921 Mammographic calcification found on diagnostic imaging of breast: Secondary | ICD-10-CM | POA: Diagnosis present

## 2017-11-24 HISTORY — PX: BREAST BIOPSY: SHX20

## 2017-11-25 LAB — SURGICAL PATHOLOGY

## 2017-11-27 ENCOUNTER — Encounter: Payer: Self-pay | Admitting: Obstetrics and Gynecology

## 2017-12-03 ENCOUNTER — Other Ambulatory Visit: Payer: Self-pay

## 2017-12-03 ENCOUNTER — Encounter: Payer: Self-pay | Admitting: Internal Medicine

## 2017-12-03 ENCOUNTER — Ambulatory Visit (INDEPENDENT_AMBULATORY_CARE_PROVIDER_SITE_OTHER): Payer: BC Managed Care – PPO | Admitting: Internal Medicine

## 2017-12-03 VITALS — BP 118/86 | HR 67 | Temp 98.0°F | Ht 64.0 in | Wt 192.0 lb

## 2017-12-03 DIAGNOSIS — Z1389 Encounter for screening for other disorder: Secondary | ICD-10-CM

## 2017-12-03 DIAGNOSIS — N6091 Unspecified benign mammary dysplasia of right breast: Secondary | ICD-10-CM

## 2017-12-03 DIAGNOSIS — M62838 Other muscle spasm: Secondary | ICD-10-CM

## 2017-12-03 DIAGNOSIS — F419 Anxiety disorder, unspecified: Secondary | ICD-10-CM | POA: Diagnosis not present

## 2017-12-03 DIAGNOSIS — K219 Gastro-esophageal reflux disease without esophagitis: Secondary | ICD-10-CM

## 2017-12-03 DIAGNOSIS — M542 Cervicalgia: Secondary | ICD-10-CM

## 2017-12-03 DIAGNOSIS — Z1329 Encounter for screening for other suspected endocrine disorder: Secondary | ICD-10-CM

## 2017-12-03 DIAGNOSIS — F418 Other specified anxiety disorders: Secondary | ICD-10-CM

## 2017-12-03 DIAGNOSIS — Z13220 Encounter for screening for lipoid disorders: Secondary | ICD-10-CM

## 2017-12-03 DIAGNOSIS — Z13818 Encounter for screening for other digestive system disorders: Secondary | ICD-10-CM

## 2017-12-03 DIAGNOSIS — Z23 Encounter for immunization: Secondary | ICD-10-CM

## 2017-12-03 DIAGNOSIS — Z1159 Encounter for screening for other viral diseases: Secondary | ICD-10-CM

## 2017-12-03 DIAGNOSIS — Z0184 Encounter for antibody response examination: Secondary | ICD-10-CM

## 2017-12-03 DIAGNOSIS — M2550 Pain in unspecified joint: Secondary | ICD-10-CM

## 2017-12-03 DIAGNOSIS — E559 Vitamin D deficiency, unspecified: Secondary | ICD-10-CM

## 2017-12-03 MED ORDER — OMEPRAZOLE 20 MG PO CPDR: 20.0000 mg | DELAYED_RELEASE_CAPSULE | Freq: Every day | ORAL | 12 refills | Status: DC

## 2017-12-03 MED ORDER — CYCLOBENZAPRINE HCL 10 MG PO TABS: ORAL_TABLET | ORAL | 2 refills | Status: DC

## 2017-12-03 MED ORDER — SERTRALINE HCL 50 MG PO TABS: 50.0000 mg | ORAL_TABLET | Freq: Every day | ORAL | 3 refills | Status: DC

## 2017-12-03 MED ORDER — CELECOXIB 200 MG PO CAPS: 200.0000 mg | ORAL_CAPSULE | Freq: Every day | ORAL | 3 refills | Status: DC

## 2017-12-03 MED ORDER — ALPRAZOLAM 0.25 MG PO TABS: 0.2500 mg | ORAL_TABLET | Freq: Every day | ORAL | 2 refills | Status: DC | PRN

## 2017-12-03 NOTE — Progress Notes (Addendum)
Chief Complaint  Patient presents with  . Establish Care   Establish care  1. No issues  2. Reviewed PMH, suH, FH with patient today and would like refills  3. Hot flashes still having cycles LMP 08/2017 will f/u ob/gyn [facility]   Review of Systems  Constitutional: Negative for weight loss.  HENT: Negative for hearing loss.   Eyes: Negative for blurred vision.  Respiratory: Negative for wheezing.   Cardiovascular: Negative for chest pain.  Gastrointestinal: Negative for abdominal pain.  Musculoskeletal: Negative for back pain and neck pain.  Skin: Negative for rash.  Neurological: Negative for headaches.  Psychiatric/Behavioral: Negative for suicidal ideas. The patient is not nervous/anxious.    Past Medical History:  Diagnosis Date  . Anxiety   . Arthritis    hips, low back  . Breast mass, right   . Chicken pox   . GERD (gastroesophageal reflux disease)   . Plantar fasciitis   . PONV (postoperative nausea and vomiting)   . Vertigo    Past Surgical History:  Procedure Laterality Date  . BREAST BIOPSY Right 10/17/2015   stereo/atypical ductal hyperplasia  . BREAST BIOPSY Left 11/24/2017   AFFIRM BX, X MARKER, PATH PENDING  . BREAST EXCISIONAL BIOPSY Right 2017   atypical ductal hyperplasia  . BREAST LUMPECTOMY Right 12/04/2015   ADH  . BREAST SURGERY Bilateral 2010   Reduction   . CARPAL TUNNEL RELEASE     Unsure of date and which laterality   . CERVICAL SPINE SURGERY  2012   Discectomy and fusion of C4,5, and 6 Dr. Gretta Began   . LASIK Bilateral 2001  . RADIOACTIVE SEED GUIDED EXCISIONAL BREAST BIOPSY Right 12/04/2015   Procedure: RADIOACTIVE SEED GUIDED EXCISIONAL BREAST BIOPSY;  Surgeon: Emelia Loron, MD;  Location: [facility];  Service: General;  Laterality: Right;  . REDUCTION MAMMAPLASTY Bilateral 2010  . SHOULDER SURGERY Left 1982   bone spur removed/ bone separation repair  . thumb joint replacement     L thumb Dr. Amanda Pea   . TONSILLECTOMY  AND ADENOIDECTOMY  1967   Family History  Problem Relation Age of Onset  . Arthritis Mother   . Hypertension Mother   . Kidney disease Mother   . Alcohol abuse Father   . Arthritis Father   . Lung cancer Father   . Hypertension Father   . Cancer Father        lung smoker   . Alcohol abuse Brother   . Hypertension Brother   . Arthritis Maternal Aunt   . Cancer Maternal Aunt        melanoma  . Arthritis Maternal Uncle   . Lung cancer Maternal Uncle   . Cancer Maternal Uncle        bladder not smoker   . Colon cancer Paternal Uncle   . Cancer Paternal Uncle        colon cancer older age   . Lung cancer Maternal Grandmother   . Hypertension Maternal Grandmother   . Cancer Maternal Grandmother        lung not smoker   . Cancer Cousin        m cousin 25s-50s dxed   . Breast cancer Neg Hx    Social History   Socioeconomic History  . Marital status: Married    Spouse name: Not on file  . Number of children: Not on file  . Years of education: Not on file  . Highest education level: Not on file  Occupational  History  . Not on file  Social Needs  . Financial resource strain: Not on file  . Food insecurity:    Worry: Not on file    Inability: Not on file  . Transportation needs:    Medical: Not on file    Non-medical: Not on file  Tobacco Use  . Smoking status: Never Smoker  . Smokeless tobacco: Never Used  . Tobacco comment: socially in college, 1 pk lasted 1 month  Substance and Sexual Activity  . Alcohol use: Yes    Alcohol/week: 0.0 - 0.6 oz    Comment: social  . Drug use: No  . Sexual activity: Yes  Lifestyle  . Physical activity:    Days per week: Not on file    Minutes per session: Not on file  . Stress: Not on file  Relationships  . Social connections:    Talks on phone: Not on file    Gets together: Not on file    Attends religious service: Not on file    Active member of club or organization: Not on file    Attends meetings of clubs or  organizations: Not on file    Relationship status: Not on file  . Intimate partner violence:    Fear of current or ex partner: Not on file    Emotionally abused: Not on file    Physically abused: Not on file    Forced sexual activity: Not on file  Other Topics Concern  . 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 ALPRAZolam (XANAX) 0.25 MG tablet Take 1 tablet (0.25 mg total) by mouth daily as needed for anxiety.  . Ascorbic Acid (VITAMIN C) 1000 MG tablet Take 1,000 mg by mouth daily.  . Biotin (BIOTIN 5000) 5 MG CAPS Take 5,000 mg by mouth daily.  . celecoxib (CELEBREX) 200 MG capsule Take 1 capsule (200 mg total) by mouth daily.  . cyclobenzaprine (FLEXERIL) 10 MG tablet TAKE 1 TABLET (10 MG TOTAL) BY MOUTH AT BEDTIME prn  . meclizine (ANTIVERT) 25 MG tablet Take 25 mg by mouth 3 (three) times daily as needed for dizziness.  . Multiple Vitamins-Minerals (WOMENS MULTIVITAMIN PLUS PO) Take by mouth once.  . Omega-3 Fatty Acids (FISH OIL) 1000 MG CAPS Take by mouth.  Marland Kitchen. omeprazole (PRILOSEC) 20 MG capsule Take 1 capsule (20 mg total) by mouth daily.  . sertraline (ZOLOFT) 50 MG tablet Take 1 tablet (50 mg total) by mouth daily.  . Turmeric 500 MG CAPS Take 500 mg by mouth.  . [DISCONTINUED] ALPRAZolam (XANAX) 0.25 MG tablet Take 1 tablet (0.25 mg total) by mouth 2 (two) times daily as needed for anxiety.  . [DISCONTINUED] celecoxib (CELEBREX) 200 MG capsule Take 1 capsule (200 mg total) by mouth daily.  . [DISCONTINUED] cyclobenzaprine (FLEXERIL) 10 MG tablet TAKE 1 TABLET (10 MG TOTAL) BY MOUTH AT BEDTIME.  . [DISCONTINUED] omeprazole (PRILOSEC) 20 MG capsule Take 1 capsule (20 mg total) by mouth daily.  . [DISCONTINUED] sertraline (ZOLOFT) 50 MG tablet Take 1 tablet (50 mg total) by mouth daily.   No Known Allergies Recent Results (from the past 2160 hour(s))  Surgical pathology      Status: None   Collection Time: 11/24/17  9:37 AM  Result Value Ref Range   SURGICAL PATHOLOGY      Surgical  Pathology CASE: [id number] PATIENT: Yvonne Moore Surgical Pathology Report     SPECIMEN SUBMITTED: A. Breast, left  CLINICAL HISTORY: Calcifications  PRE-OPERATIVE DIAGNOSIS: None provided  POST-OPERATIVE DIAGNOSIS: None provided.     DIAGNOSIS: A.  LEFT BREAST, UPPER INNER QUADRANT; STEREOTACTIC BIOPSY: - COLUMNAR CELL CHANGE WITH CALCIFICATIONS. - PSEUDO-ANGIOMATOUS STROMAL HYPERPLASIA.   GROSS DESCRIPTION: A. Labeled: Left breast UIQ Received: in a formalin-filled [facility] collection device Accompanying specimen radiograph: Yes Time/Date in fixative: 9:29 AM on 11/24/2017 Cold ischemic time: 4 minutes Total fixation time: 10 hours Core pieces: Multiple Measurement: Aggregate, 4.2 x 1.5 x 0.2 cm Description / comments: Yellow to red lobulated fibrofatty fragments Inked: Black Entirely submitted in cassette(s): 1 - section E 2 - section F 3 - remaining tissue   Final Diagnosis performed by Glenice Bow, MD.   Electronically sign ed 11/25/2017 4:05:50PM The electronic signature indicates that the named Attending Pathologist has evaluated the specimen  Technical component performed at [facility], [address] [place], [place] [address] Lab: [phone] Dir: Jolene Schimke, MD, MMM  Professional component performed at [facility], [facility], [address] [place], [place], [place] [address] Lab: [phone] Dir: Georgiann Cocker. Rubinas, MD    Objective  Body mass index is 32.96 kg/m. Wt Readings from Last 3 Encounters:  12/03/17 192 lb (87.1 kg)  10/28/16 193 lb (87.5 kg)  01/04/16 195 lb 14.1 oz (88.9 kg)   Temp Readings from Last 3 Encounters:  12/03/17 98 F (36.7 C) (Oral)  01/04/16 (!) 96 F (35.6 C) (Tympanic)  12/04/15 97.9 F (36.6 C)   BP Readings from Last 3 Encounters:  12/03/17 118/86  10/28/16 122/78  05/27/16 (!)  133/94   Pulse Readings from Last 3 Encounters:  12/03/17 67  10/28/16 61  05/27/16 79    Physical Exam  Constitutional: She is oriented to person, place, and time. Vital signs are normal. She appears well-developed and well-nourished. She is cooperative.  HENT:  Head: Normocephalic and atraumatic.  Mouth/Throat: Oropharynx is clear and moist and mucous membranes are normal.  Eyes: Pupils are equal, round, and reactive to light. Conjunctivae are normal.  Cardiovascular: Normal rate, regular rhythm and normal heart sounds.  Pulmonary/Chest: Effort normal and breath sounds normal.  Neurological: She is alert and oriented to person, place, and time. Gait normal.  Skin: Skin is warm, dry and intact.  Psychiatric: She has a normal mood and affect. Her speech is normal and behavior is normal. Judgment and thought content normal. Cognition and memory are normal.  Nursing note and vitals reviewed.   Assessment   1. Anxiety >depression  2. Muscles spasm and arthritis  3. GERD 4. HM Plan   1. Refilled meds  2. Refilled meds  3. Refilled meds  4.  Will disc flu upcoming Tdap today  Will disc shingrix in future  Declined HIV   Pap 10/28/16 pap neg + HPV f/u ob/gyn [facility]  Mammogram [place] 11/19/17 with left breast bx calcifications pseduangiomatous stromal hyperplasia and h/o 11/2015 right bx bx fibrocystic changes/calcifications no malignancy either s/p right lumpectomy Dr. Dwain Sarna in [place]  -f/u in 1 year  Colonoscopy [facility] but had [facility] -obtained [facility] records [facility] no record colonoscopy will need in future to track down Dermatology Dr. Roseanne Kaufman saw 03/2017 due to f/u 03/2018    Provider: Dr. French Ana McLean-Scocuzza-Internal Medicine

## 2017-12-03 NOTE — Addendum Note (Signed)
Addended by: Martie LeeFOY, Masha Orbach C on: 12/03/2017 10:01 AM   Modules accepted: Orders

## 2017-12-03 NOTE — Patient Instructions (Signed)
DTaP Vaccine (Diphtheria, Tetanus, and Pertussis): What You Need to Know 1. Why get vaccinated? Diphtheria, tetanus, and pertussis are serious diseases caused by bacteria. Diphtheria and pertussis are spread from person to person. Tetanus enters the body through cuts or wounds. DIPHTHERIA causes a thick covering in the back of the throat.  It can lead to breathing problems, paralysis, heart failure, and even death.  TETANUS (Lockjaw) causes painful tightening of the muscles, usually all over the body.  It can lead to "locking" of the jaw so the victim cannot open his mouth or swallow. Tetanus leads to death in up to 2 out of 10 cases.  PERTUSSIS (Whooping Cough) causes coughing spells so bad that it is hard for infants to eat, drink, or breathe. These spells can last for weeks.  It can lead to pneumonia, seizures (jerking and staring spells), brain damage, and death.  Diphtheria, tetanus, and pertussis vaccine (DTaP) can help prevent these diseases. Most children who are vaccinated with DTaP will be protected throughout childhood. Many more children would get these diseases if we stopped vaccinating. DTaP is a safer version of an older vaccine called DTP. DTP is no longer used in the [place]. 2. Who should get DTaP vaccine and when? Children should get 5 doses of DTaP vaccine, one dose at each of the following ages:  2 months  4 months  6 months  15-18 months  4-6 years  DTaP may be given at the same time as other vaccines. 3. Some children should not get DTaP vaccine or should wait  Children with minor illnesses, such as a cold, may be vaccinated. But children who are moderately or severely ill should usually wait until they recover before getting DTaP vaccine.  Any child who had a life-threatening allergic reaction after a dose of DTaP should not get another dose.  Any child who suffered a brain or nervous system disease within 7 days after a dose of DTaP should not get  another dose.  Talk with your doctor if your child: ? had a seizure or collapsed after a dose of DTaP, ? cried non-stop for 3 hours or more after a dose of DTaP, ? had a fever over 105F after a dose of DTaP. Ask your doctor for more information. Some of these children should not get another dose of pertussis vaccine, but may get a vaccine without pertussis, called DT. 4. Older children and adults DTaP is not licensed for adolescents, adults, or children 7 years of age and older. But older people still need protection. A vaccine called Tdap is similar to DTaP. A single dose of Tdap is recommended for people 11 through 56 years of age. Another vaccine, called Td, protects against tetanus and diphtheria, but not pertussis. It is recommended every 10 years. There are separate Vaccine Information Statements for these vaccines. 5. What are the risks from DTaP vaccine? Getting diphtheria, tetanus, or pertussis disease is much riskier than getting DTaP vaccine. However, a vaccine, like any medicine, is capable of causing serious problems, such as severe allergic reactions. The risk of DTaP vaccine causing serious harm, or death, is extremely small. Mild problems (common)  Fever (up to about 1 child in 4)  Redness or swelling where the shot was given (up to about 1 child in 4)  Soreness or tenderness where the shot was given (up to about 1 child in 4) These problems occur more often after the 4th and 5th doses of the DTaP series than after   earlier doses. Sometimes the 4th or 5th dose of DTaP vaccine is followed by swelling of the entire arm or leg in which the shot was given, lasting 1-7 days (up to about 1 child in 30). Other mild problems include:  Fussiness (up to about 1 child in 3)  Tiredness or poor appetite (up to about 1 child in 10)  Vomiting (up to about 1 child in 50) These problems generally occur 1-3 days after the shot. Moderate problems (uncommon)  Seizure (jerking or staring)  (about 1 child out of 14,000)  Non-stop crying, for 3 hours or more (up to about 1 child out of 1,000)  High fever, over 105F (about 1 child out of 16,000) Severe problems (very rare)  Serious allergic reaction (less than 1 out of a million doses)  Several other severe problems have been reported after DTaP vaccine. These include: ? Long-term seizures, coma, or lowered consciousness ? Permanent brain damage. These are so rare it is hard to tell if they are caused by the vaccine. Controlling fever is especially important for children who have had seizures, for any reason. It is also important if another family member has had seizures. You can reduce fever and pain by giving your child an aspirin-free pain reliever when the shot is given, and for the next 24 hours, following the package instructions. 6. What if there is a serious reaction? What should I look for? Look for anything that concerns you, such as signs of a severe allergic reaction, very high fever, or behavior changes. Signs of a severe allergic reaction can include hives, swelling of the face and throat, difficulty breathing, a fast heartbeat, dizziness, and weakness. These would start a few minutes to a few hours after the vaccination. What should I do?  If you think it is a severe allergic reaction or other emergency that can't wait, call 9-1-1 or get the person to the nearest hospital. Otherwise, call your doctor.  Afterward, the reaction should be reported to the Vaccine Adverse Event Reporting System (VAERS). Your doctor might file this report, or you can do it yourself through the VAERS web site at www.vaers.hhs.gov, or by calling 1-800-822-7967. ? VAERS is only for reporting reactions. They do not give medical advice. 7. The National Vaccine Injury Compensation Program The National Vaccine Injury Compensation Program (VICP) is a federal program that was created to compensate people who may have been injured by certain  vaccines. Persons who believe they may have been injured by a vaccine can learn about the program and about filing a claim by calling 1-800-338-2382 or visiting the VICP website at www.hrsa.gov/vaccinecompensation. 8. How can I learn more?  Ask your doctor.  Call your local or state health department.  Contact the Centers for Disease Control and Prevention (CDC): ? Call 1-800-232-4636 (1-800-CDC-INFO) or ? Visit CDC's website at www.cdc.gov/vaccines CDC DTaP Vaccine (Diphtheria, Tetanus, and Pertussis) VIS (09/19/05) This information is not intended to replace advice given to you by your health care provider. Make sure you discuss any questions you have with your health care provider. Document Released: 02/17/2006 Document Revised: 01/11/2016 Document Reviewed: 01/11/2016 Elsevier Interactive Patient Education  2017 Elsevier Inc.  

## 2017-12-05 ENCOUNTER — Other Ambulatory Visit (INDEPENDENT_AMBULATORY_CARE_PROVIDER_SITE_OTHER): Payer: BC Managed Care – PPO

## 2017-12-05 DIAGNOSIS — E559 Vitamin D deficiency, unspecified: Secondary | ICD-10-CM

## 2017-12-05 DIAGNOSIS — Z1329 Encounter for screening for other suspected endocrine disorder: Secondary | ICD-10-CM

## 2017-12-05 DIAGNOSIS — Z1389 Encounter for screening for other disorder: Secondary | ICD-10-CM

## 2017-12-05 DIAGNOSIS — Z0184 Encounter for antibody response examination: Secondary | ICD-10-CM

## 2017-12-05 DIAGNOSIS — F419 Anxiety disorder, unspecified: Secondary | ICD-10-CM | POA: Diagnosis not present

## 2017-12-05 DIAGNOSIS — Z13818 Encounter for screening for other digestive system disorders: Secondary | ICD-10-CM

## 2017-12-05 DIAGNOSIS — Z13220 Encounter for screening for lipoid disorders: Secondary | ICD-10-CM

## 2017-12-05 DIAGNOSIS — Z1159 Encounter for screening for other viral diseases: Secondary | ICD-10-CM

## 2017-12-05 NOTE — Addendum Note (Signed)
Addended by: Penne LashWIGGINS, Allice Garro N on: 12/05/2017 09:06 AM   Modules accepted: Orders

## 2017-12-06 LAB — MICROSCOPIC EXAMINATION: Casts: NONE SEEN /lpf

## 2017-12-06 LAB — URINALYSIS, ROUTINE W REFLEX MICROSCOPIC
Bilirubin, UA: NEGATIVE
GLUCOSE, UA: NEGATIVE
KETONES UA: NEGATIVE
Nitrite, UA: NEGATIVE
Protein, UA: NEGATIVE
RBC, UA: NEGATIVE
SPEC GRAV UA: 1.017 (ref 1.005–1.030)
Urobilinogen, Ur: 0.2 mg/dL (ref 0.2–1.0)
pH, UA: 5 (ref 5.0–7.5)

## 2017-12-09 LAB — CBC WITH DIFFERENTIAL/PLATELET
Basophils Absolute: 69 cells/uL (ref 0–200)
Basophils Relative: 1.3 %
Eosinophils Absolute: 143 cells/uL (ref 15–500)
Eosinophils Relative: 2.7 %
HEMATOCRIT: 36.7 % (ref 35.0–45.0)
Hemoglobin: 12.5 g/dL (ref 11.7–15.5)
LYMPHS ABS: 2041 {cells}/uL (ref 850–3900)
MCH: 29.7 pg (ref 27.0–33.0)
MCHC: 34.1 g/dL (ref 32.0–36.0)
MCV: 87.2 fL (ref 80.0–100.0)
MPV: 11.3 fL (ref 7.5–12.5)
Monocytes Relative: 10.1 %
NEUTROS PCT: 47.4 %
Neutro Abs: 2512 cells/uL (ref 1500–7800)
Platelets: 234 10*3/uL (ref 140–400)
RBC: 4.21 10*6/uL (ref 3.80–5.10)
RDW: 12.8 % (ref 11.0–15.0)
Total Lymphocyte: 38.5 %
WBC: 5.3 10*3/uL (ref 3.8–10.8)
WBCMIX: 535 {cells}/uL (ref 200–950)

## 2017-12-09 LAB — COMPREHENSIVE METABOLIC PANEL
AG Ratio: 1.8 (calc) (ref 1.0–2.5)
ALBUMIN MSPROF: 4.2 g/dL (ref 3.6–5.1)
ALT: 18 U/L (ref 6–29)
AST: 16 U/L (ref 10–35)
Alkaline phosphatase (APISO): 60 U/L (ref 33–130)
BUN: 12 mg/dL (ref 7–25)
CO2: 25 mmol/L (ref 20–32)
CREATININE: 0.6 mg/dL (ref 0.50–1.05)
Calcium: 9.1 mg/dL (ref 8.6–10.4)
Chloride: 104 mmol/L (ref 98–110)
GLUCOSE: 90 mg/dL (ref 65–99)
Globulin: 2.3 g/dL (calc) (ref 1.9–3.7)
POTASSIUM: 4.3 mmol/L (ref 3.5–5.3)
Sodium: 141 mmol/L (ref 135–146)
Total Bilirubin: 0.4 mg/dL (ref 0.2–1.2)
Total Protein: 6.5 g/dL (ref 6.1–8.1)

## 2017-12-09 LAB — MEASLES/MUMPS/RUBELLA IMMUNITY
Mumps IgG: >300 AU/mL
RUBEOLA IGG: 61.5 [AU]/ml
Rubella: 7.94 index

## 2017-12-09 LAB — HEPATITIS B SURFACE ANTIBODY, QUANTITATIVE: Hepatitis B-Post: <5 m[IU]/mL — ABNORMAL LOW (ref >=10)

## 2017-12-09 LAB — LIPID PANEL
CHOL/HDL RATIO: 3.6 (calc) (ref <5.0)
Cholesterol: 203 mg/dL — ABNORMAL HIGH (ref <200)
HDL: 56 mg/dL (ref >50)
LDL CHOLESTEROL (CALC): 119 mg/dL — AB
Non-HDL Cholesterol (Calc): 147 mg/dL (calc) — ABNORMAL HIGH (ref <130)
Triglycerides: 157 mg/dL — ABNORMAL HIGH (ref <150)

## 2017-12-09 LAB — HEPATITIS C ANTIBODY
HEP C AB: NONREACTIVE
SIGNAL TO CUT-OFF: 0.01 (ref <1.00)

## 2017-12-09 LAB — TSH: TSH: 1.04 mIU/L

## 2017-12-09 LAB — T4, FREE: Free T4: 1.1 ng/dL (ref 0.8–1.8)

## 2017-12-09 LAB — VITAMIN D 25 HYDROXY (VIT D DEFICIENCY, FRACTURES): VIT D 25 HYDROXY: 37 ng/mL (ref 30–100)

## 2017-12-18 ENCOUNTER — Ambulatory Visit (INDEPENDENT_AMBULATORY_CARE_PROVIDER_SITE_OTHER): Payer: BC Managed Care – PPO | Admitting: Obstetrics and Gynecology

## 2017-12-18 ENCOUNTER — Other Ambulatory Visit (HOSPITAL_COMMUNITY)
Admission: RE | Admit: 2017-12-18 | Discharge: 2017-12-18 | Disposition: A | Discharge status: Home / Self Care | Payer: BC Managed Care – PPO | Source: Ambulatory Visit | Attending: Obstetrics and Gynecology | Admitting: Obstetrics and Gynecology

## 2017-12-18 ENCOUNTER — Encounter: Payer: Self-pay | Admitting: Obstetrics and Gynecology

## 2017-12-18 VITALS — BP 110/80 | HR 67 | Ht 65.0 in | Wt 191.0 lb

## 2017-12-18 DIAGNOSIS — B977 Papillomavirus as the cause of diseases classified elsewhere: Secondary | ICD-10-CM | POA: Insufficient documentation

## 2017-12-18 DIAGNOSIS — Z1239 Encounter for other screening for malignant neoplasm of breast: Secondary | ICD-10-CM

## 2017-12-18 DIAGNOSIS — N951 Menopausal and female climacteric states: Secondary | ICD-10-CM | POA: Insufficient documentation

## 2017-12-18 DIAGNOSIS — Z01411 Encounter for gynecological examination (general) (routine) with abnormal findings: Secondary | ICD-10-CM

## 2017-12-18 DIAGNOSIS — R87810 Cervical high risk human papillomavirus (HPV) DNA test positive: Secondary | ICD-10-CM | POA: Insufficient documentation

## 2017-12-18 DIAGNOSIS — Z124 Encounter for screening for malignant neoplasm of cervix: Secondary | ICD-10-CM | POA: Diagnosis not present

## 2017-12-18 DIAGNOSIS — Z1151 Encounter for screening for human papillomavirus (HPV): Secondary | ICD-10-CM

## 2017-12-18 DIAGNOSIS — Z1231 Encounter for screening mammogram for malignant neoplasm of breast: Secondary | ICD-10-CM | POA: Diagnosis not present

## 2017-12-18 DIAGNOSIS — Z01419 Encounter for gynecological examination (general) (routine) without abnormal findings: Secondary | ICD-10-CM

## 2017-12-18 NOTE — Progress Notes (Signed)
Chief Complaint  Patient presents with  . Gynecologic Exam    HPI:      [name] is a 57 y.o. No obstetric history on file. who LMP was No LMP recorded. Patient is perimenopausal., presents today for her annual examination.  Her menses are irregular since perimenopausal. Last real menses was 4/19  with 4 day flow. She had spotting for a few days 5/18 and 8/18. Has never gone a year without bleeding. Dysmenorrhea none. She does not have intermenstrual bleeding. She does have vasomotor sx.   Sex activity: single partner. She does have vaginal dryness and decreased libido, uses lubricants prn.  Last Pap: 10/28/16 Results were: no abnormalities /POS HPV DNA.  Repeat pap due today. Hx of STDs: none  Last mammogram: 11/19/17  Results were: abnormal/Birads 4 for LT breast calcification. Bx showed COLUMNAR CELL CHANGE WITH CALCIFICATIONS. PSEUDO-ANGIOMATOUS STROMAL HYPERPLASIA. Repeat mammo due in 1 yr. 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 Dwain SarnaWakefield 2017.   There is no FH of breast cancer. There is no FH of ovarian cancer. The patient does do self-breast exams.  Colonoscopy: colonoscopy 4 years ago without abnormalities. Repeat after 10 yrs.  Tobacco use: The patient denies current or previous tobacco use. Alcohol use: social drinker Exercise: moderately active  She does get adequate calcium and Vitamin D in her diet. Recent labs with PCP. Hx of borderline lipids. PCP is now managing meds for chronic med problems.    Past Medical History:  Diagnosis Date  . Anxiety   . Arthritis    hips, low back  . Breast mass, right   . Chicken pox   . GERD (gastroesophageal reflux disease)   . Plantar fasciitis   . PONV (postoperative nausea and vomiting)   . Vertigo     Past Surgical History:  Procedure Laterality Date  . BREAST BIOPSY Right 10/17/2015   stereo/atypical ductal hyperplasia  . BREAST BIOPSY Left 11/24/2017   AFFIRM BX, X MARKER, PATH PENDING    . BREAST EXCISIONAL BIOPSY Right 2017   atypical ductal hyperplasia  . BREAST LUMPECTOMY Right 12/04/2015   ADH  . BREAST SURGERY Bilateral 2010   Reduction   . CARPAL TUNNEL RELEASE     Unsure of date and which laterality   . CERVICAL SPINE SURGERY  2012   Discectomy and fusion of C4,5, and 6 Dr. Gretta BeganHursh   . LASIK Bilateral 2001  . RADIOACTIVE SEED GUIDED EXCISIONAL BREAST BIOPSY Right 12/04/2015   Procedure: RADIOACTIVE SEED GUIDED EXCISIONAL BREAST BIOPSY;  Surgeon: Emelia LoronMatthew Wakefield, MD;  Location: [facility];  Service: General;  Laterality: Right;  . REDUCTION MAMMAPLASTY Bilateral 2010  . SHOULDER SURGERY Left 1982   bone spur removed/ bone separation repair  . thumb joint replacement     L thumb Dr. Amanda PeaGramig   . TONSILLECTOMY AND ADENOIDECTOMY  1967    Family History  Problem Relation Age of Onset  . Arthritis Mother   . Hypertension Mother   . Kidney disease Mother   . Alcohol abuse Father   . Arthritis Father   . Lung cancer Father   . Hypertension Father   . Cancer Father        lung smoker   . Alcohol abuse Brother   . Hypertension Brother   . Arthritis Maternal Aunt   . Cancer Maternal Aunt        melanoma  . Arthritis Maternal Uncle   . Lung cancer Maternal Uncle   .  Cancer Maternal Uncle        bladder not smoker   . Colon cancer Paternal Uncle   . Cancer Paternal Uncle        colon cancer older age   . Lung cancer Maternal Grandmother   . Hypertension Maternal Grandmother   . Cancer Maternal Grandmother        lung not smoker   . Cancer Cousin        m cousin [id number] dxed   . Breast cancer Neg Hx     Social History   Socioeconomic History  . Marital status: Married    Spouse name: Not on file  . Number of children: Not on file  . Years of education: Not on file  . Highest education level: Not on file  Occupational History  . Not on file  Social Needs  . Financial resource strain: Not on file  . Food insecurity:    Worry: Not  on file    Inability: Not on file  . Transportation needs:    Medical: Not on file    Non-medical: Not on file  Tobacco Use  . Smoking status: Never Smoker  . Smokeless tobacco: Never Used  . Tobacco comment: socially in college, 1 pk lasted 1 month  Substance and Sexual Activity  . Alcohol use: Yes    Alcohol/week: 0.0 - 1.0 standard drinks    Comment: social  . Drug use: No  . Sexual activity: Yes  Lifestyle  . Physical activity:    Days per week: Not on file    Minutes per session: Not on file  . Stress: Not on file  Relationships  . Social connections:    Talks on phone: Not on file    Gets together: Not on file    Attends religious service: Not on file    Active member of club or organization: Not on file    Attends meetings of clubs or organizations: Not on file    Relationship status: Not on file  . Intimate partner violence:    Fear of current or ex partner: Not on file    Emotionally abused: Not on file    Physically abused: Not on file    Forced sexual activity: Not on file  Other Topics Concern  . 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  ALPRAZolam (XANAX) 0.25 MG tablet, Take 1 tablet (0.25 mg total) by mouth daily as needed for anxiety., Disp: 30 tablet, Rfl: 2 .  Ascorbic Acid (VITAMIN C) 1000 MG tablet, Take 1,000 mg by mouth daily., Disp: , Rfl:  .  Biotin (BIOTIN 5000) 5 MG CAPS, Take 5,000 mg by mouth daily., Disp: , Rfl:  .  Black Pepper-Turmeric (TURMERIC CURCUMIN) 09-998 MG CAPS, , Disp: , Rfl:  .  celecoxib (CELEBREX) 200 MG capsule, Take 1 capsule (200 mg total) by mouth daily., Disp: 90 capsule, Rfl: 3 .  cyclobenzaprine (FLEXERIL) 10 MG tablet, TAKE 1 TABLET (10 MG TOTAL) BY MOUTH AT BEDTIME prn, Disp: 30 tablet, Rfl: 2 .  Multiple Vitamins-Minerals (WOMENS MULTIVITAMIN PLUS PO), Take by mouth once., Disp: , Rfl:  .  Omega-3 Fatty  Acids (FISH OIL) 1000 MG CAPS, Take by mouth., Disp: , Rfl:  .  omeprazole (PRILOSEC) 20 MG capsule, Take 1 capsule (20 mg total) by mouth  daily., Disp: 30 capsule, Rfl: 12 .  sertraline (ZOLOFT) 50 MG tablet, Take 1 tablet (50 mg total) by mouth daily., Disp: 90 tablet, Rfl: 3 .  Turmeric 500 MG CAPS, Take 500 mg by mouth., Disp: , Rfl:  .  meclizine (ANTIVERT) 25 MG tablet, Take 25 mg by mouth 3 (three) times daily as needed for dizziness., Disp: , Rfl:    ROS:  Review of Systems  Constitutional: Negative for fatigue, fever and unexpected weight change.  Respiratory: Negative for cough, shortness of breath and wheezing.   Cardiovascular: Negative for chest pain, palpitations and leg swelling.  Gastrointestinal: Negative for blood in stool, constipation, diarrhea, nausea and vomiting.  Endocrine: Negative for cold intolerance, heat intolerance and polyuria.  Genitourinary: Negative for dyspareunia, dysuria, flank pain, frequency, genital sores, hematuria, menstrual problem, pelvic pain, urgency, vaginal bleeding, vaginal discharge and vaginal pain.  Musculoskeletal: Positive for arthralgias and back pain. Negative for joint swelling and myalgias.  Skin: Negative for rash.  Neurological: Negative for dizziness, syncope, light-headedness, numbness and headaches.  Hematological: Negative for adenopathy.  Psychiatric/Behavioral: Negative for agitation, confusion, sleep disturbance and suicidal ideas. The patient is not nervous/anxious.     Objective: BP 110/80   Pulse 67   Ht 5\' 5"  (1.651 m)   Wt 191 lb (86.6 kg)   BMI 31.78 kg/m    Physical Exam  Constitutional: She is oriented to person, place, and time. She appears well-developed and well-nourished.  Genitourinary: Vagina normal and uterus normal. There is no rash or tenderness on the right labia. There is no rash or tenderness on the left labia. No erythema or tenderness in the vagina. No vaginal discharge found. Right adnexum does  not display mass and does not display tenderness. Left adnexum does not display mass and does not display tenderness. Cervix does not exhibit motion tenderness or polyp. Uterus is not enlarged or tender.  Neck: Normal range of motion. No thyromegaly present.  Cardiovascular: Normal rate, regular rhythm and normal heart sounds.  No murmur heard. Pulmonary/Chest: Effort normal and breath sounds normal. Right breast exhibits no mass, no nipple discharge, no skin change and no tenderness. Left breast exhibits no mass, no nipple discharge, no skin change and no tenderness.  Abdominal: Soft. There is no tenderness. There is no guarding.  Musculoskeletal: Normal range of motion.  Neurological: She is alert and oriented to person, place, and time. No cranial nerve deficit.  Psychiatric: She has a normal mood and affect. Her behavior is normal.  Vitals reviewed.   Assessment/Plan:  Encounter for annual routine gynecological examination  Cervical cancer screening - Plan: Cytology - PAP  Screening for HPV (human papillomavirus) - Plan: Cytology - PAP  Cervical high risk human papillomavirus (HPV) DNA test positive - Will call with pap results.  - Plan: Cytology - PAP  Screening for breast cancer - Pt current on mammo  Perimenopause - F/u prn DUB         GYN counsel breast self exam, mammography screening, menopause, adequate intake of calcium and vitamin D     F/U  Return in about 1 year (around 12/19/2018).  Kymberlyn Eckford B. Kamali Sakata, PA-C 12/18/2017 2:51 PM

## 2017-12-18 NOTE — Patient Instructions (Signed)
I value your feedback and entrusting us with your care. If you get a [facility] patient survey, I would appreciate you taking the time to let us know about your experience today. Thank you! 

## 2017-12-23 LAB — CYTOLOGY - PAP
Diagnosis: NEGATIVE
HPV: NOT DETECTED

## 2018-01-23 IMAGING — MG MM DIGITAL SCREENING BILAT W/ TOMO W/ CAD
8 of 12 series · 8 of 28 positions shown · non-contrast
Comparison: Previous exam(s).

CLINICAL DATA: Screening. History of bilateral breast reduction
mammoplasty in 1181. History of right breast excisional biopsy
demonstrating ADH in 2587.

EXAM:
2D DIGITAL SCREENING BILATERAL MAMMOGRAM WITH CAD AND ADJUNCT TOMO

[L MLO synth-2D]
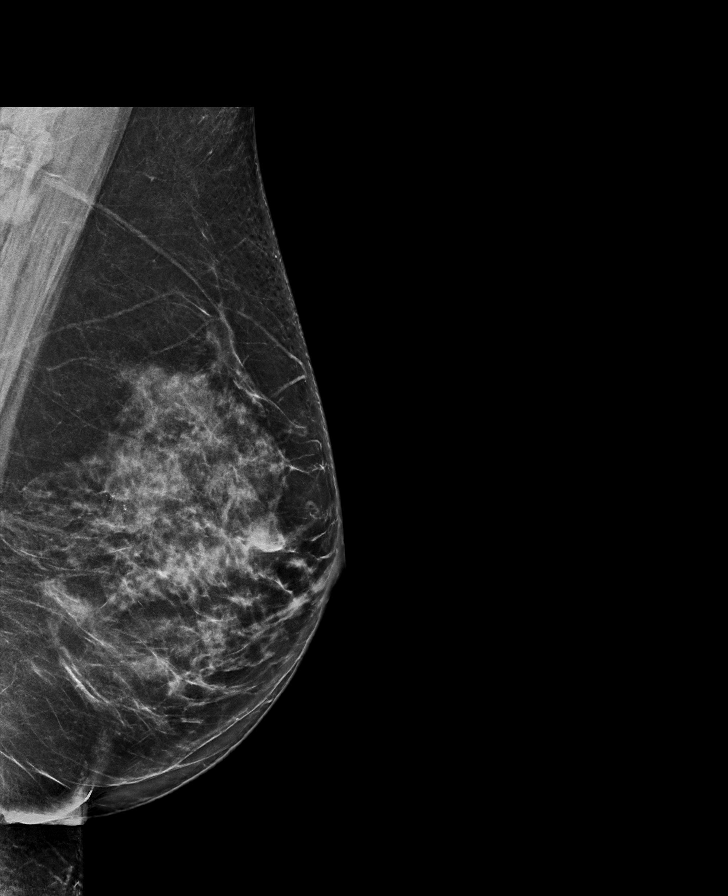

[L MLO]
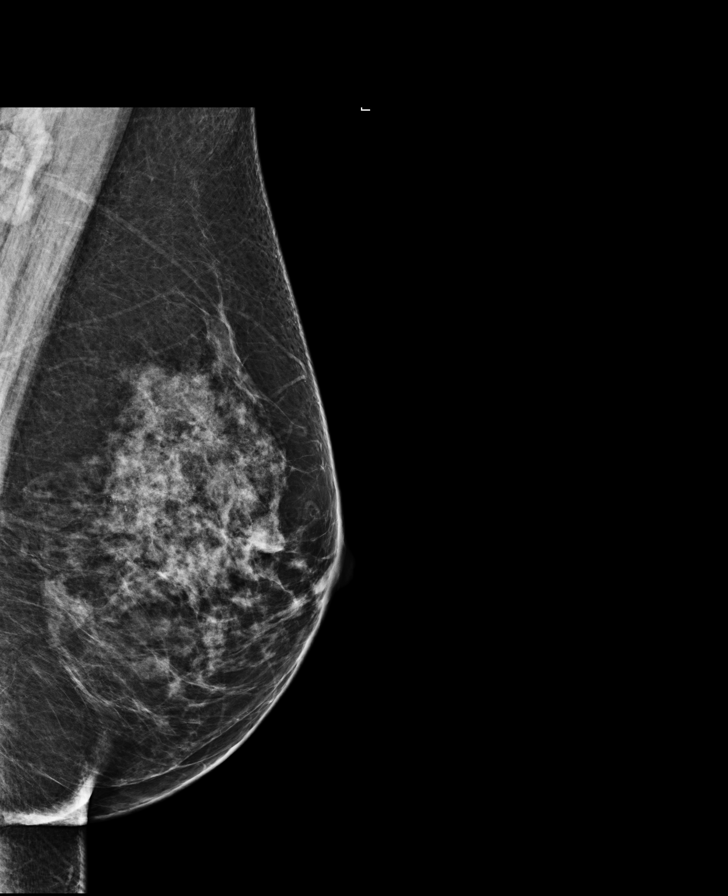

[R MLO]
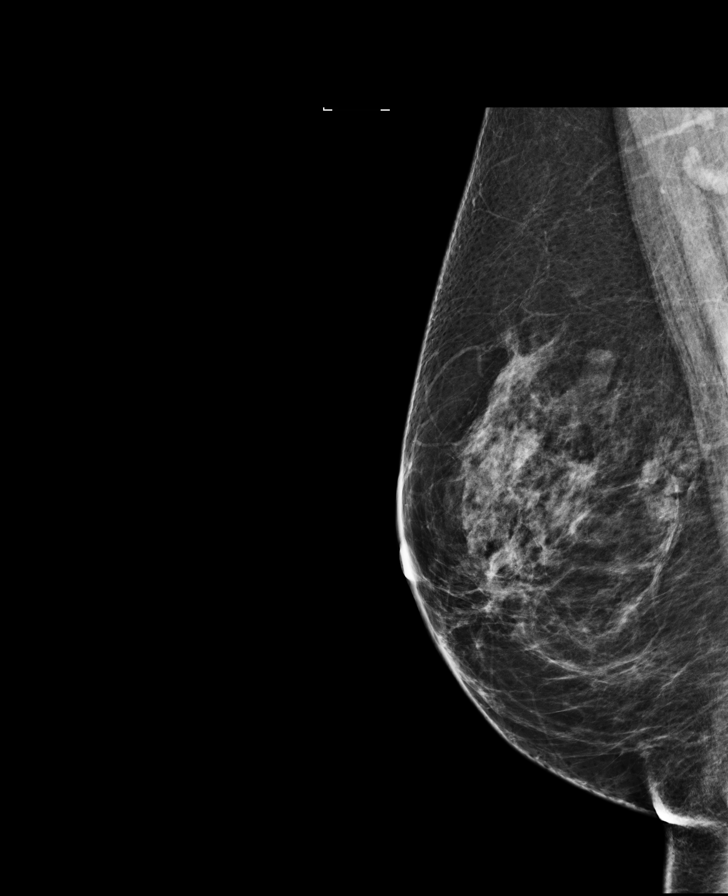

[R CC synth-2D]
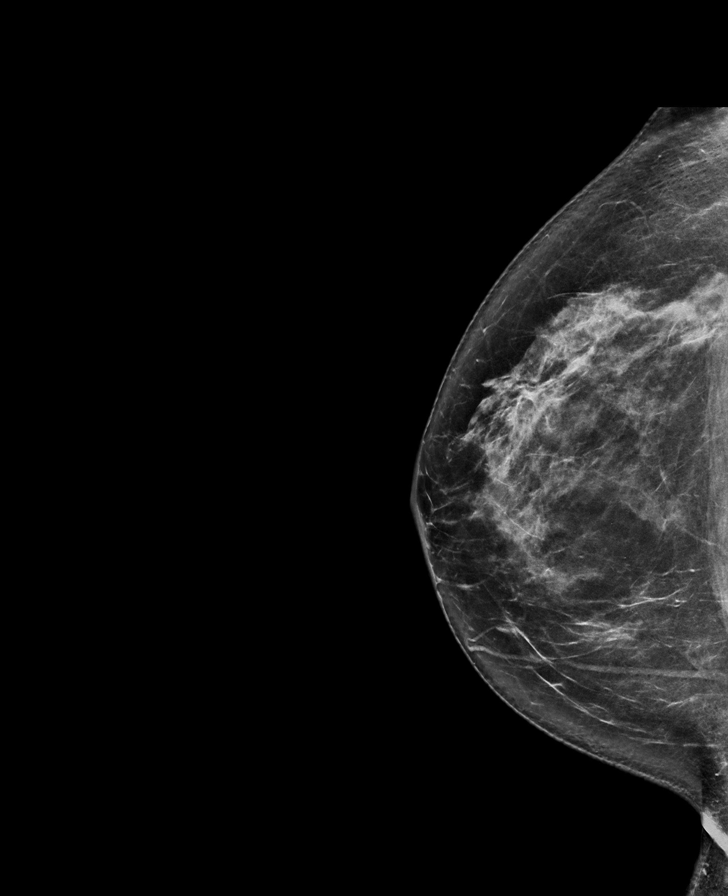

[L CC]
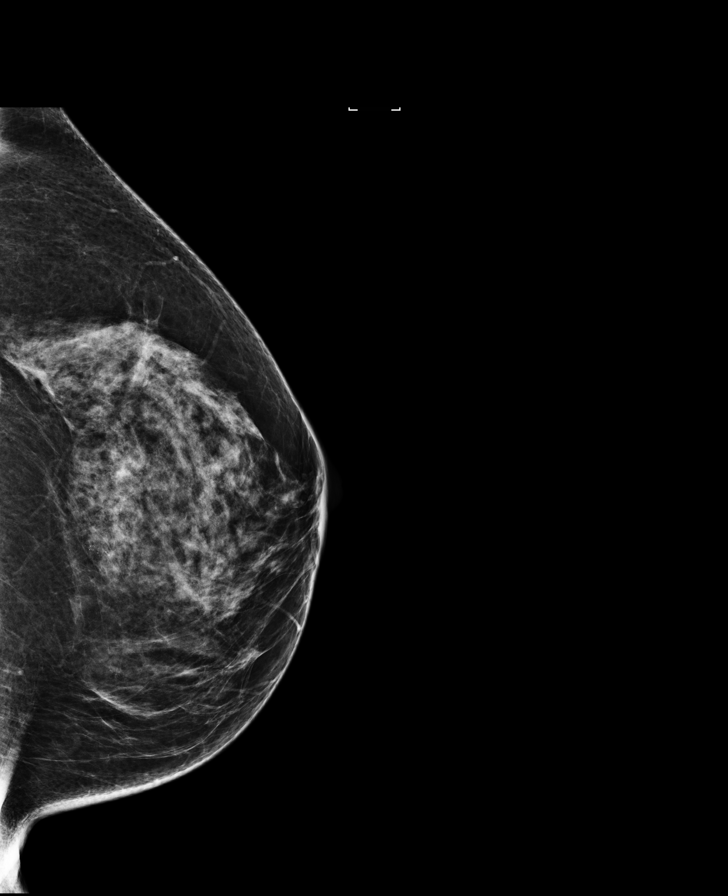

[R CC]
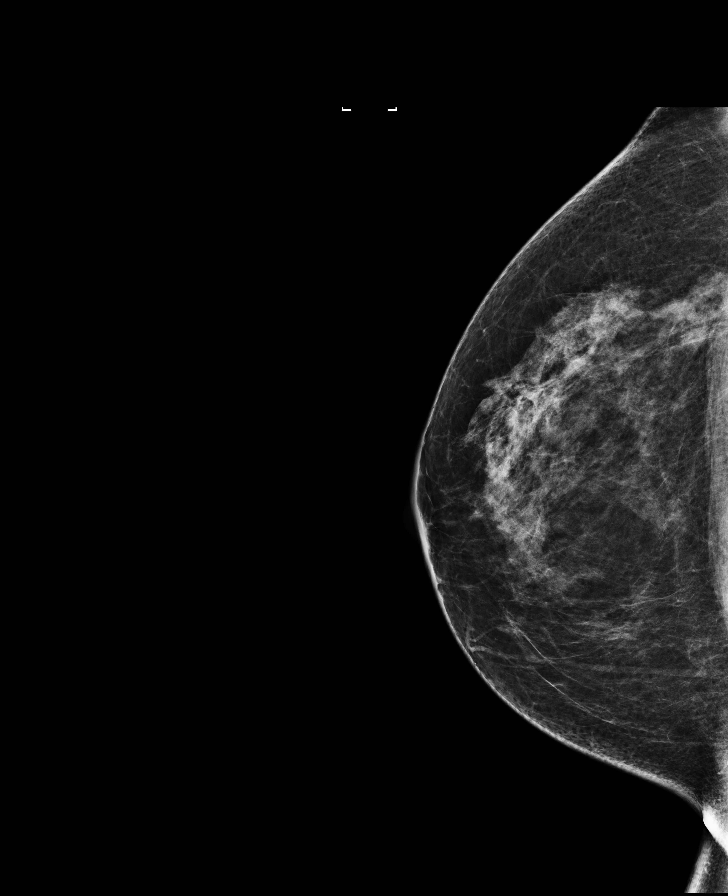

[L CC synth-2D]
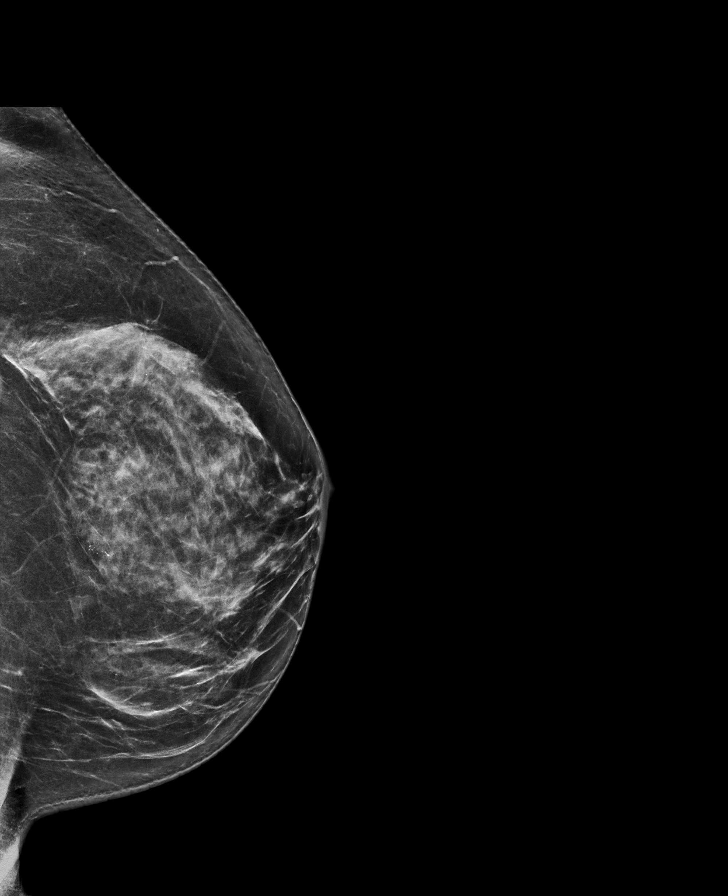

[R MLO synth-2D]
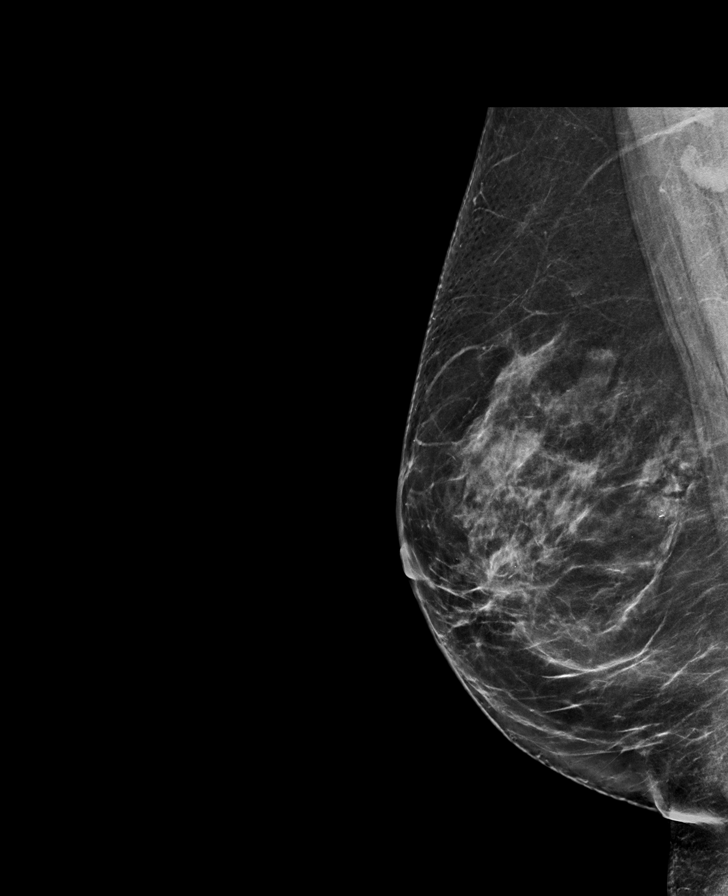

[8 of 28 positions shown; findings below may reference images not displayed]

ACR Breast Density Category c: The breast tissue is heterogeneously
dense, which may obscure small masses.
FINDINGS: There are no findings suspicious for malignancy. Expected
postsurgical changes in the right breast. Images were processed with
CAD.
IMPRESSION: No mammographic evidence of malignancy. A result letter of this
screening mammogram will be mailed directly to the patient.

RECOMMENDATION:
Screening mammogram in one year. (Code:[id number])

BI-RADS CATEGORY  1: Negative.

## 2018-01-29 ENCOUNTER — Encounter: Payer: Self-pay | Admitting: Podiatry

## 2018-02-04 ENCOUNTER — Other Ambulatory Visit: Payer: BC Managed Care – PPO | Admitting: Orthotics

## 2018-03-05 ENCOUNTER — Encounter: Payer: Self-pay | Admitting: Internal Medicine

## 2018-03-05 ENCOUNTER — Ambulatory Visit: Payer: BC Managed Care – PPO | Admitting: Internal Medicine

## 2018-03-05 VITALS — BP 136/78 | HR 58 | Temp 98.3°F | Ht 65.0 in | Wt 194.0 lb

## 2018-03-05 DIAGNOSIS — F329 Major depressive disorder, single episode, unspecified: Secondary | ICD-10-CM

## 2018-03-05 DIAGNOSIS — E785 Hyperlipidemia, unspecified: Secondary | ICD-10-CM | POA: Diagnosis not present

## 2018-03-05 DIAGNOSIS — R232 Flushing: Secondary | ICD-10-CM

## 2018-03-05 DIAGNOSIS — F32A Depression, unspecified: Secondary | ICD-10-CM

## 2018-03-05 DIAGNOSIS — Z1231 Encounter for screening mammogram for malignant neoplasm of breast: Secondary | ICD-10-CM

## 2018-03-05 DIAGNOSIS — F419 Anxiety disorder, unspecified: Secondary | ICD-10-CM | POA: Diagnosis not present

## 2018-03-05 DIAGNOSIS — R921 Mammographic calcification found on diagnostic imaging of breast: Secondary | ICD-10-CM | POA: Diagnosis not present

## 2018-03-05 MED ORDER — VENLAFAXINE HCL ER 37.5 MG PO CP24: 37.5000 mg | ORAL_CAPSULE | Freq: Every day | ORAL | 2 refills | Status: DC

## 2018-03-05 NOTE — Patient Instructions (Addendum)
Cut zoloft in 1/2 x 1 week then stop day 8 start effexor 37.5 mg qd  Call of my chart in 1 month let me know how the change is going    Venlafaxine extended-release capsules What is this medicine? VENLAFAXINE(VEN la fax een) is used to treat depression, anxiety and panic disorder. This medicine may be used for other purposes; ask your health care provider or pharmacist if you have questions. COMMON BRAND NAME(S): Effexor XR What should I tell my health care provider before I take this medicine? They need to know if you have any of these conditions: -bleeding disorders -glaucoma -heart disease -high blood pressure -high cholesterol -kidney disease -liver disease -low levels of sodium in the blood -mania or bipolar disorder -seizures -suicidal thoughts, plans, or attempt; a previous suicide attempt by you or a family -take medicines that treat or prevent blood clots -thyroid disease -an unusual or allergic reaction to venlafaxine, desvenlafaxine, other medicines, foods, dyes, or preservatives -pregnant or trying to get pregnant -breast-feeding How should I use this medicine? Take this medicine by mouth with a full glass of water. Follow the directions on the prescription label. Do not cut, crush, or chew this medicine. Take it with food. If needed, the capsule may be carefully opened and the entire contents sprinkled on a spoonful of cool applesauce. Swallow the applesauce/pellet mixture right away without chewing and follow with a glass of water to ensure complete swallowing of the pellets. Try to take your medicine at about the same time each day. Do not take your medicine more often than directed. Do not stop taking this medicine suddenly except upon the advice of your doctor. Stopping this medicine too quickly may cause serious side effects or your condition may worsen. A special MedGuide will be given to you by the pharmacist with each prescription and refill. Be sure to read this  information carefully each time. Talk to your pediatrician regarding the use of this medicine in children. Special care may be needed. Overdosage: If you think you have taken too much of this medicine contact a poison control center or emergency room at once. NOTE: This medicine is only for you. Do not share this medicine with others. What if I miss a dose? If you miss a dose, take it as soon as you can. If it is almost time for your next dose, take only that dose. Do not take double or extra doses. What may interact with this medicine? Do not take this medicine with any of the following medications: -certain medicines for fungal infections like fluconazole, itraconazole, ketoconazole, posaconazole, voriconazole -cisapride -desvenlafaxine -dofetilide -dronedarone -duloxetine -levomilnacipran -linezolid -MAOIs like Carbex, Eldepryl, Marplan, Nardil, and Parnate -methylene blue (injected into a vein) -milnacipran -pimozide -thioridazine -ziprasidone This medicine may also interact with the following medications: -amphetamines -aspirin and aspirin-like medicines -certain medicines for depression, anxiety, or psychotic disturbances -certain medicines for migraine headaches like almotriptan, eletriptan, frovatriptan, naratriptan, rizatriptan, sumatriptan, zolmitriptan -certain medicines for sleep -certain medicines that treat or prevent blood clots like dalteparin, enoxaparin, warfarin -cimetidine -clozapine -diuretics -fentanyl -furazolidone -indinavir -isoniazid -lithium -metoprolol -NSAIDS, medicines for pain and inflammation, like ibuprofen or naproxen -other medicines that prolong the QT interval (cause an abnormal heart rhythm) -procarbazine -rasagiline -supplements like St. John's wort, kava kava, valerian -tramadol -tryptophan This list may not describe all possible interactions. Give your health care provider a list of all the medicines, herbs, non-prescription drugs,  or dietary supplements you use. Also tell them if you smoke, drink  alcohol, or use illegal drugs. Some items may interact with your medicine. What should I watch for while using this medicine? Tell your doctor if your symptoms do not get better or if they get worse. Visit your doctor or health care professional for regular checks on your progress. Because it may take several weeks to see the full effects of this medicine, it is important to continue your treatment as prescribed by your doctor. Patients and their families should watch out for new or worsening thoughts of suicide or depression. Also watch out for sudden changes in feelings such as feeling anxious, agitated, panicky, irritable, hostile, aggressive, impulsive, severely restless, overly excited and hyperactive, or not being able to sleep. If this happens, especially at the beginning of treatment or after a change in dose, call your health care professional. This medicine can cause an increase in blood pressure. Check with your doctor for instructions on monitoring your blood pressure while taking this medicine. You may get drowsy or dizzy. Do not drive, use machinery, or do anything that needs mental alertness until you know how this medicine affects you. Do not stand or sit up quickly, especially if you are an older patient. This reduces the risk of dizzy or fainting spells. Alcohol may interfere with the effect of this medicine. Avoid alcoholic drinks. Your mouth may get dry. Chewing sugarless gum, sucking hard candy and drinking plenty of water will help. Contact your doctor if the problem does not go away or is severe. What side effects may I notice from receiving this medicine? Side effects that you should report to your doctor or health care professional as soon as possible: -allergic reactions like skin rash, itching or hives, swelling of the face, lips, or tongue -anxious -breathing problems -confusion -changes in vision -chest  pain -confusion -elevated mood, decreased need for sleep, racing thoughts, impulsive behavior -eye pain -fast, irregular heartbeat -feeling faint or lightheaded, falls -feeling agitated, angry, or irritable -hallucination, loss of contact with reality -high blood pressure -loss of balance or coordination -palpitations -redness, blistering, peeling or loosening of the skin, including inside the mouth -restlessness, pacing, inability to keep still -seizures -stiff muscles -suicidal thoughts or other mood changes -trouble passing urine or change in the amount of urine -trouble sleeping -unusual bleeding or bruising -unusually weak or tired -vomiting Side effects that usually do not require medical attention (report to your doctor or health care professional if they continue or are bothersome): -change in sex drive or performance -change in appetite or weight -constipation -dizziness -dry mouth -headache -increased sweating -nausea -tired This list may not describe all possible side effects. Call your doctor for medical advice about side effects. You may report side effects to FDA at 1-800-FDA-1088. Where should I keep my medicine? Keep out of the reach of children. Store at a controlled temperature between 20 and 25 degrees C (68 degrees and 77 degrees F), in a dry place. Throw away any unused medicine after the expiration date. NOTE: This sheet is a summary. It may not cover all possible information. If you have questions about this medicine, talk to your doctor, pharmacist, or health care provider.  2018 Elsevier/Gold Standard (2015-09-21 18:38:02)  Menopause Menopause is the normal time of life when menstrual periods stop completely. Menopause is complete when you have missed 12 consecutive menstrual periods. It usually occurs between the ages of 48 years and 55 years. Very rarely does a woman develop menopause before the age of 40 years. At menopause, your ovaries  stop  producing the female hormones estrogen and progesterone. This can cause undesirable symptoms and also affect your health. Sometimes the symptoms may occur 4-5 years before the menopause begins. There is no relationship between menopause and:  Oral contraceptives.  Number of children you had.  Race.  The age your menstrual periods started (menarche).  Heavy smokers and very thin women may develop menopause earlier in life. What are the causes?  The ovaries stop producing the female hormones estrogen and progesterone. Other causes include:  Surgery to remove both ovaries.  The ovaries stop functioning for no known reason.  Tumors of the pituitary gland in the brain.  Medical disease that affects the ovaries and hormone production.  Radiation treatment to the abdomen or pelvis.  Chemotherapy that affects the ovaries.  What are the signs or symptoms?  Hot flashes.  Night sweats.  Decrease in sex drive.  Vaginal dryness and thinning of the vagina causing painful intercourse.  Dryness of the skin and developing wrinkles.  Headaches.  Tiredness.  Irritability.  Memory problems.  Weight gain.  Bladder infections.  Hair growth of the face and chest.  Infertility. More serious symptoms include:  Loss of bone (osteoporosis) causing breaks (fractures).  Depression.  Hardening and narrowing of the arteries (atherosclerosis) causing heart attacks and strokes.  How is this diagnosed?  When the menstrual periods have stopped for 12 straight months.  Physical exam.  Hormone studies of the blood. How is this treated? There are many treatment choices and nearly as many questions about them. The decisions to treat or not to treat menopausal changes is an individual choice made with your health care provider. Your health care provider can discuss the treatments with you. Together, you can decide which treatment will work best for you. Your treatment choices may  include:  Hormone therapy (estrogen and progesterone).  Non-hormonal medicines.  Treating the individual symptoms with medicine (for example antidepressants for depression).  Herbal medicines that may help specific symptoms.  Counseling by a psychiatrist or psychologist.  Group therapy.  Lifestyle changes including: ? Eating healthy. ? Regular exercise. ? Limiting caffeine and alcohol. ? Stress management and meditation.  No treatment.  Follow these instructions at home:  Take the medicine your health care provider gives you as directed.  Get plenty of sleep and rest.  Exercise regularly.  Eat a diet that contains calcium (good for the bones) and soy products (acts like estrogen hormone).  Avoid alcoholic beverages.  Do not smoke.  If you have hot flashes, dress in layers.  Take supplements, calcium, and vitamin D to strengthen bones.  You can use over-the-counter lubricants or moisturizers for vaginal dryness.  Group therapy is sometimes very helpful.  Acupuncture may be helpful in some cases. Contact a health care provider if:  You are not sure you are in menopause.  You are having menopausal symptoms and need advice and treatment.  You are still having menstrual periods after age 40 years.  You have pain with intercourse.  Menopause is complete (no menstrual period for 12 months) and you develop vaginal bleeding.  You need a referral to a specialist (gynecologist, psychiatrist, or psychologist) for treatment. Get help right away if:  You have severe depression.  You have excessive vaginal bleeding.  You fell and think you have a broken bone.  You have pain when you urinate.  You develop leg or chest pain.  You have a fast pounding heart beat (palpitations).  You have severe headaches.  You develop vision problems.  You feel a lump in your breast.  You have abdominal pain or severe indigestion. This information is not intended to  replace advice given to you by your health care provider. Make sure you discuss any questions you have with your health care provider. Document Released: 07/13/2003 Document Revised: 09/28/2015 Document Reviewed: 11/19/2012 Elsevier Interactive Patient Education  2017 [employer].

## 2018-03-05 NOTE — Progress Notes (Addendum)
Chief Complaint  Patient presents with  . Follow-up   F/u  1. C/o hot flashes at night and irritability and anxiety zoloft 50 mg is not helping with anxiety and she had to take a xanax last night  She is interested in additional tx hot flashes  2. HLD   Review of Systems  Constitutional: Negative for weight loss.  HENT: Negative for hearing loss.   Eyes: Negative for blurred vision.  Respiratory: Negative for shortness of breath.   Cardiovascular: Negative for chest pain.  Genitourinary:       +hot flashes    Skin: Negative for rash.  Neurological: Negative for headaches.  Psychiatric/Behavioral: Negative for depression.   Past Medical History:  Diagnosis Date  . Anxiety   . Arthritis    hips, low back  . Breast mass, right   . Chicken pox   . GERD (gastroesophageal reflux disease)   . Plantar fasciitis   . PONV (postoperative nausea and vomiting)   . Vertigo    Past Surgical History:  Procedure Laterality Date  . BREAST BIOPSY Right 10/17/2015   stereo/atypical ductal hyperplasia  . BREAST BIOPSY Left 11/24/2017   AFFIRM BX, X MARKER, PATH PENDING  . BREAST EXCISIONAL BIOPSY Right 2017   atypical ductal hyperplasia  . BREAST LUMPECTOMY Right 12/04/2015   ADH  . BREAST SURGERY Bilateral 2010   Reduction   . CARPAL TUNNEL RELEASE     Unsure of date and which laterality   . CERVICAL SPINE SURGERY  2012   Discectomy and fusion of C4,5, and 6 Dr. Kendal Hymen   . LASIK Bilateral 2001  . RADIOACTIVE SEED GUIDED EXCISIONAL BREAST BIOPSY Right 12/04/2015   Procedure: RADIOACTIVE SEED GUIDED EXCISIONAL BREAST BIOPSY;  Surgeon: Rolm Bookbinder, MD;  Location: [facility];  Service: General;  Laterality: Right;  . REDUCTION MAMMAPLASTY Bilateral 2010  . SHOULDER SURGERY Left 1982   bone spur removed/ bone separation repair  . thumb joint replacement     L thumb Dr. Amedeo Plenty   . TONSILLECTOMY AND ADENOIDECTOMY  1967   Family History  Problem Relation Age of  Onset  . Arthritis Mother   . Hypertension Mother   . Kidney disease Mother   . Alcohol abuse Father   . Arthritis Father   . Lung cancer Father   . Hypertension Father   . Cancer Father        lung smoker   . Alcohol abuse Brother   . Hypertension Brother   . Arthritis Maternal Aunt   . Cancer Maternal Aunt        melanoma  . Arthritis Maternal Uncle   . Lung cancer Maternal Uncle   . Cancer Maternal Uncle        bladder not smoker   . Colon cancer Paternal Uncle   . Cancer Paternal Uncle        colon cancer older age   . Lung cancer Maternal Grandmother   . Hypertension Maternal Grandmother   . Cancer Maternal Grandmother        lung not smoker   . Colon cancer Cousin        74  . Breast cancer Cousin 71   Social History   Socioeconomic History  . Marital status: Married    Spouse name: Not on file  . Number of children: Not on file  . Years of education: Not on file  . Highest education level: Not on file  Occupational History  . Not  on file  Social Needs  . Financial resource strain: Not on file  . Food insecurity:    Worry: Not on file    Inability: Not on file  . Transportation needs:    Medical: Not on file    Non-medical: Not on file  Tobacco Use  . Smoking status: Never Smoker  . Smokeless tobacco: Never Used  . Tobacco comment: socially in college, 1 pk lasted 1 month  Substance and Sexual Activity  . Alcohol use: Yes    Alcohol/week: 0.0 - 1.0 standard drinks    Comment: social  . Drug use: No  . Sexual activity: Yes  Lifestyle  . Physical activity:    Days per week: Not on file    Minutes per session: Not on file  . Stress: Not on file  Relationships  . Social connections:    Talks on phone: Not on file    Gets together: Not on file    Attends religious service: Not on file    Active member of club or organization: Not on file    Attends meetings of clubs or organizations: Not on file    Relationship status: Not on file  . Intimate  partner violence:    Fear of current or ex partner: Not on file    Emotionally abused: Not on file    Physically abused: Not on file    Forced sexual activity: Not on file  Other Topics Concern  . 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 ALPRAZolam (XANAX) 0.25 MG tablet Take 1 tablet (0.25 mg total) by mouth daily as needed for anxiety.  . Ascorbic Acid (VITAMIN C) 1000 MG tablet Take 1,000 mg by mouth daily.  . Biotin (BIOTIN 5000) 5 MG CAPS Take 5,000 mg by mouth daily.  . Black Pepper-Turmeric (TURMERIC CURCUMIN) 09-998 MG CAPS   . celecoxib (CELEBREX) 200 MG capsule Take 1 capsule (200 mg total) by mouth daily.  . cyclobenzaprine (FLEXERIL) 10 MG tablet TAKE 1 TABLET (10 MG TOTAL) BY MOUTH AT BEDTIME prn  . meclizine (ANTIVERT) 25 MG tablet Take 25 mg by mouth 3 (three) times daily as needed for dizziness.  . Multiple Vitamins-Minerals (WOMENS MULTIVITAMIN PLUS PO) Take by mouth once.  . Omega-3 Fatty Acids (FISH OIL) 1000 MG CAPS Take by mouth.  Marland Kitchen omeprazole (PRILOSEC) 20 MG capsule Take 1 capsule (20 mg total) by mouth daily.  . sertraline (ZOLOFT) 50 MG tablet Take 1 tablet (50 mg total) by mouth daily.  . [DISCONTINUED] Turmeric 500 MG CAPS Take 500 mg by mouth.   No Known Allergies Recent Results (from the past 2160 hour(s))  Cytology - PAP     Status: None   Collection Time: 12/18/17 12:00 AM  Result Value Ref Range   Adequacy      Satisfactory for evaluation  endocervical/transformation zone component PRESENT.   Diagnosis      NEGATIVE FOR INTRAEPITHELIAL LESIONS OR MALIGNANCY.   HPV NOT DETECTED     Comment: Normal Reference Range - NOT Detected   Material Submitted CervicoVaginal Pap [ThinPrep Imaged]    Objective  Body mass index is 32.28 kg/m. Wt Readings from Last 3 Encounters:  03/05/18 194 lb (88 kg)  12/18/17 191 lb (86.6 kg)  12/03/17 192 lb (87.1  kg)   Temp Readings from Last  3 Encounters:  03/05/18 98.3 F (36.8 C) (Oral)  12/03/17 98 F (36.7 C) (Oral)  01/04/16 (!) 96 F (35.6 C) (Tympanic)   BP Readings from Last 3 Encounters:  03/05/18 136/78  12/18/17 110/80  12/03/17 118/86   Pulse Readings from Last 3 Encounters:  03/05/18 (!) 58  12/18/17 67  12/03/17 67    Physical Exam  Constitutional: She is oriented to person, place, and time. Vital signs are normal. She appears well-developed and well-nourished. She is cooperative.  HENT:  Head: Normocephalic and atraumatic.  Mouth/Throat: Oropharynx is clear and moist and mucous membranes are normal.  Eyes: Pupils are equal, round, and reactive to light. Conjunctivae are normal.  Cardiovascular: Normal rate, regular rhythm and normal heart sounds.  Pulmonary/Chest: Effort normal and breath sounds normal.  Neurological: She is alert and oriented to person, place, and time. Gait normal.  Skin: Skin is warm, dry and intact.  Psychiatric: She has a normal mood and affect. Her speech is normal and behavior is normal. Judgment and thought content normal. Cognition and memory are normal.  Nursing note and vitals reviewed.   Assessment   1. Menopause with irritability and anxiety h/o depression   2. HM 3. HLD Plan   1. D/c zoloft 50 mg qd taper to 25 mg x 1 week then stop day 8 start effexor 37.5  My chart in 1 month if helping  Prn xanax 0.25  2.  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 Kellie Moor saw 03/2017 due to f/u 03/2018 check right era lesion   3. Given cholesterol info   08/05/2018 [facility] in [place] right thumb pain OA thumb, OT ordered Dr. Amedeo Plenty F/u in 2 weeks  08/19/2018  [facility] [place] s/p right [facility] arthroplasty with double tendon transfer Dr. Amedeo Plenty on 07/07/2018   08/28/2018 saw [facility] right thumb pain  Goal improve rom Provider: Dr. Olivia Mackie McLean-Scocuzza-Internal Medicine

## 2018-03-05 NOTE — Progress Notes (Signed)
Pre visit review using our clinic review tool, if applicable. No additional management support is needed unless otherwise documented below in the visit note. 

## 2018-03-09 ENCOUNTER — Encounter: Payer: Self-pay | Admitting: Internal Medicine

## 2018-03-10 ENCOUNTER — Telehealth: Payer: Self-pay | Admitting: Internal Medicine

## 2018-03-10 NOTE — Telephone Encounter (Signed)
See my chart  Call pt to see if she wants to be worked in tomorrow am    Would you like to be worked in before you leave tomorrow to check for strep or the flu which is going around before you leave?  I like mucinex DM green label or Robitussin DM  Cough drops  Warm tea with honey and lemon  Without an appt for a new symptom I can not prescribe any medication without evaluation but we can try to work you in the the am I start at 7:45 am   Thanks TMS

## 2018-03-13 ENCOUNTER — Encounter: Payer: Self-pay | Admitting: Internal Medicine

## 2018-04-16 ENCOUNTER — Encounter: Payer: Self-pay | Admitting: Internal Medicine

## 2018-08-26 ENCOUNTER — Other Ambulatory Visit: Payer: Self-pay | Admitting: Internal Medicine

## 2018-08-26 DIAGNOSIS — F329 Major depressive disorder, single episode, unspecified: Secondary | ICD-10-CM

## 2018-08-26 DIAGNOSIS — R232 Flushing: Secondary | ICD-10-CM

## 2018-08-26 DIAGNOSIS — F419 Anxiety disorder, unspecified: Principal | ICD-10-CM

## 2018-08-26 MED ORDER — VENLAFAXINE HCL ER 37.5 MG PO CP24: 37.5000 mg | ORAL_CAPSULE | Freq: Every day | ORAL | 1 refills | Status: DC

## 2018-09-03 ENCOUNTER — Encounter: Payer: Self-pay | Admitting: Internal Medicine

## 2018-09-03 ENCOUNTER — Other Ambulatory Visit: Payer: Self-pay

## 2018-09-03 ENCOUNTER — Ambulatory Visit (INDEPENDENT_AMBULATORY_CARE_PROVIDER_SITE_OTHER): Payer: BC Managed Care – PPO | Admitting: Internal Medicine

## 2018-09-03 DIAGNOSIS — R232 Flushing: Secondary | ICD-10-CM | POA: Diagnosis not present

## 2018-09-03 DIAGNOSIS — F419 Anxiety disorder, unspecified: Secondary | ICD-10-CM | POA: Diagnosis not present

## 2018-09-03 DIAGNOSIS — F32A Depression, unspecified: Secondary | ICD-10-CM

## 2018-09-03 DIAGNOSIS — Z1389 Encounter for screening for other disorder: Secondary | ICD-10-CM

## 2018-09-03 DIAGNOSIS — Z1329 Encounter for screening for other suspected endocrine disorder: Secondary | ICD-10-CM

## 2018-09-03 DIAGNOSIS — F329 Major depressive disorder, single episode, unspecified: Secondary | ICD-10-CM

## 2018-09-03 DIAGNOSIS — E785 Hyperlipidemia, unspecified: Secondary | ICD-10-CM

## 2018-09-03 DIAGNOSIS — N6310 Unspecified lump in the right breast, unspecified quadrant: Secondary | ICD-10-CM

## 2018-09-03 NOTE — Progress Notes (Signed)
Virtual Visit via Video Note  I connected with Yvonne Moore   on 09/03/18 at 11:07 AM EDT by a video enabled telemedicine application and verified that I am speaking with the correct person using two identifiers.  Location patient: home Location provider:work  Persons participating in the virtual visit: patient, provider  I discussed the limitations of evaluation and management by telemedicine and the availability of in person appointments. The patient expressed understanding and agreed to proceed.   HPI: 1. Hot flashes mood and anxiety ok on effexor 37.5 mg qd and cycle has not been present x 1 year 2. H/o right breast lumpectomy for atypical ductal hyperplasia and calcifications and fat necrosis she reports she feels 1-2 clock on right breast black eye pea sized lump right outside areolar x 3-4 weeks and she had clip placement in left breast and bx 11/24/17  LEFT BREAST, UPPER INNER QUADRANT; STEREOTACTIC BIOPSY:  - COLUMNAR CELL CHANGE WITH CALCIFICATIONS.  - PSEUDO-ANGIOMATOUS STROMAL HYPERPLASIA.    ROS: See pertinent positives and negatives per HPI.  Past Medical History:  Diagnosis Date  . Anxiety   . Arthritis    hips, low back  . Breast mass, right   . Chicken pox   . GERD (gastroesophageal reflux disease)   . Plantar fasciitis   . PONV (postoperative nausea and vomiting)   . Vertigo     Past Surgical History:  Procedure Laterality Date  . BREAST BIOPSY Right 10/17/2015   stereo/atypical ductal hyperplasia  . BREAST BIOPSY Left 11/24/2017   AFFIRM BX, X MARKER, PATH PENDING  . BREAST EXCISIONAL BIOPSY Right 2017   atypical ductal hyperplasia  . BREAST LUMPECTOMY Right 12/04/2015   ADH  . BREAST SURGERY Bilateral 2010   Reduction   . CARPAL TUNNEL RELEASE     Unsure of date and which laterality   . CERVICAL SPINE SURGERY  2012   Discectomy and fusion of C4,5, and 6 Dr. Kendal Hymen   . LASIK Bilateral 2001  . RADIOACTIVE SEED GUIDED EXCISIONAL BREAST BIOPSY Right  12/04/2015   Procedure: RADIOACTIVE SEED GUIDED EXCISIONAL BREAST BIOPSY;  Surgeon: Rolm Bookbinder, MD;  Location: [place];  Service: General;  Laterality: Right;  . REDUCTION MAMMAPLASTY Bilateral 2010  . SHOULDER SURGERY Left 1982   bone spur removed/ bone separation repair  . thumb joint replacement     L thumb Dr. Amedeo Plenty   . TONSILLECTOMY AND ADENOIDECTOMY  1967    Family History  Problem Relation Age of Onset  . Arthritis Mother   . Hypertension Mother   . Kidney disease Mother   . Alcohol abuse Father   . Arthritis Father   . Lung cancer Father   . Hypertension Father   . Cancer Father        lung smoker   . Alcohol abuse Brother   . Hypertension Brother   . Arthritis Maternal Aunt   . Cancer Maternal Aunt        melanoma  . Arthritis Maternal Uncle   . Lung cancer Maternal Uncle   . Cancer Maternal Uncle        bladder not smoker   . Colon cancer Paternal Uncle   . Cancer Paternal Uncle        colon cancer older age 64s died in 15s  . Lung cancer Maternal Grandmother   . Hypertension Maternal Grandmother   . Cancer Maternal Grandmother        lung not smoker   . Rectal cancer  Cousin        38 had rectal cancer   . Breast cancer Cousin 24    SOCIAL HX: married    Current Outpatient Medications:  .  ALPRAZolam (XANAX) 0.25 MG tablet, Take 1 tablet (0.25 mg total) by mouth daily as needed for anxiety., Disp: 30 tablet, Rfl: 2 .  Ascorbic Acid (VITAMIN C) 1000 MG tablet, Take 1,000 mg by mouth daily., Disp: , Rfl:  .  Biotin (BIOTIN 5000) 5 MG CAPS, Take 5,000 mg by mouth daily., Disp: , Rfl:  .  Black Pepper-Turmeric (TURMERIC CURCUMIN) 09-998 MG CAPS, , Disp: , Rfl:  .  celecoxib (CELEBREX) 200 MG capsule, Take 1 capsule (200 mg total) by mouth daily., Disp: 90 capsule, Rfl: 3 .  cyclobenzaprine (FLEXERIL) 10 MG tablet, TAKE 1 TABLET (10 MG TOTAL) BY MOUTH AT BEDTIME prn, Disp: 30 tablet, Rfl: 2 .  meclizine (ANTIVERT) 25 MG tablet, Take 25  mg by mouth 3 (three) times daily as needed for dizziness., Disp: , Rfl:  .  Multiple Vitamins-Minerals (WOMENS MULTIVITAMIN PLUS PO), Take by mouth once., Disp: , Rfl:  .  Omega-3 Fatty Acids (FISH OIL) 1000 MG CAPS, Take by mouth., Disp: , Rfl:  .  omeprazole (PRILOSEC) 20 MG capsule, Take 1 capsule (20 mg total) by mouth daily., Disp: 30 capsule, Rfl: 12 .  venlafaxine XR (EFFEXOR XR) 37.5 MG 24 hr capsule, Take 1 capsule (37.5 mg total) by mouth daily with breakfast., Disp: 90 capsule, Rfl: 1  EXAM:  VITALS per patient if applicable:  GENERAL: alert, oriented, appears well and in no acute distress  HEENT: atraumatic, conjunttiva clear, no obvious abnormalities on inspection of external nose and ears  NECK: normal movements of the head and neck  LUNGS: on inspection no signs of respiratory distress, breathing rate appears normal, no obvious gross SOB, gasping or wheezing  CV: no obvious cyanosis  MS: moves all visible extremities without noticeable abnormality  PSYCH/NEURO: pleasant and cooperative, no obvious depression or anxiety, speech and thought processing grossly intact  ASSESSMENT AND PLAN:  Discussed the following assessment and plan:  Lump of right breast - Plan: MM DIAG BREAST TOMO UNI RIGHT, US BREAST LTD UNI RIGHT INC AXILLA [place]   Anxiety and depression-cont effexor 37.5 helping  Hot flashes likely menopause w/o cycle x 1 year -continue effexor helping  HM-at f/u do physical  Flu shot had 02/07/18 Tdap utd  rec hep B  mmr immune  Consider shingrix in future   Pap 12/18/17 negative neg HPV  mammo 11/2017 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 Loraine Maple [facility] negative repeat in 10 years scanned under media  Dermatology Dr. Kellie Moor saw 03/2017 due to f/u 11/2019saw and f/u due 03/2019   I discussed the assessment and treatment plan with the patient. The patient was provided an  opportunity to ask questions and all were answered. The patient agreed with the plan and demonstrated an understanding of the instructions.   The patient was advised to call back or seek an in-person evaluation if the symptoms worsen or if the condition fails to improve as anticipated.  Time spent 25 minutes  Delorise Jackson, MD

## 2018-09-14 ENCOUNTER — Ambulatory Visit
Admission: RE | Admit: 2018-09-14 | Discharge: 2018-09-14 | Disposition: A | Discharge status: Home / Self Care | Payer: BC Managed Care – PPO | Source: Ambulatory Visit | Attending: Internal Medicine | Admitting: Internal Medicine

## 2018-09-14 ENCOUNTER — Other Ambulatory Visit: Payer: Self-pay

## 2018-09-14 DIAGNOSIS — N6310 Unspecified lump in the right breast, unspecified quadrant: Secondary | ICD-10-CM | POA: Insufficient documentation

## 2018-10-28 ENCOUNTER — Ambulatory Visit: Payer: BC Managed Care – PPO | Admitting: Podiatry

## 2018-10-28 ENCOUNTER — Other Ambulatory Visit: Payer: Self-pay

## 2018-10-28 VITALS — Temp 98.4°F

## 2018-10-28 DIAGNOSIS — M722 Plantar fascial fibromatosis: Secondary | ICD-10-CM

## 2018-10-28 MED ORDER — METHYLPREDNISOLONE 4 MG PO TBPK: ORAL_TABLET | ORAL | 0 refills | Status: DC

## 2018-10-28 NOTE — Progress Notes (Signed)
She presents today for follow-up of her plantar fasciitis states that my right foot is starting to flareup is my left foot is starting to do okay.  Objective: Vital signs are stable alert and oriented x3.  Pulses are palpable.  Pain on palpation medial calcaneal tubercle of the right heel.  Assessment: Plantar fasciitis right.  Plan: After sterile Betadine skin prep I injected left heel with 20 mg Kenalog 5 mg Marcaine point of maximal tenderness.  Start her on a Medrol Dosepak to be followed by her Celebrex.

## 2018-11-25 ENCOUNTER — Ambulatory Visit: Payer: BC Managed Care – PPO | Admitting: Podiatry

## 2018-12-04 ENCOUNTER — Other Ambulatory Visit: Payer: Self-pay

## 2018-12-07 ENCOUNTER — Other Ambulatory Visit: Payer: Self-pay

## 2018-12-07 ENCOUNTER — Other Ambulatory Visit (INDEPENDENT_AMBULATORY_CARE_PROVIDER_SITE_OTHER): Payer: BC Managed Care – PPO

## 2018-12-07 DIAGNOSIS — R232 Flushing: Secondary | ICD-10-CM

## 2018-12-07 DIAGNOSIS — F329 Major depressive disorder, single episode, unspecified: Secondary | ICD-10-CM

## 2018-12-07 DIAGNOSIS — F419 Anxiety disorder, unspecified: Secondary | ICD-10-CM

## 2018-12-07 DIAGNOSIS — Z1329 Encounter for screening for other suspected endocrine disorder: Secondary | ICD-10-CM

## 2018-12-07 DIAGNOSIS — E785 Hyperlipidemia, unspecified: Secondary | ICD-10-CM | POA: Diagnosis not present

## 2018-12-07 DIAGNOSIS — Z1389 Encounter for screening for other disorder: Secondary | ICD-10-CM

## 2018-12-07 LAB — CBC WITH DIFFERENTIAL/PLATELET
Basophils Absolute: 0.1 10*3/uL (ref 0.0–0.1)
Basophils Relative: 1.2 % (ref 0.0–3.0)
Eosinophils Absolute: 0.1 10*3/uL (ref 0.0–0.7)
Eosinophils Relative: 1.6 % (ref 0.0–5.0)
HCT: 40.9 % (ref 36.0–46.0)
Hemoglobin: 13.7 g/dL (ref 12.0–15.0)
Lymphocytes Relative: 45.8 % (ref 12.0–46.0)
Lymphs Abs: 2.6 10*3/uL (ref 0.7–4.0)
MCHC: 33.6 g/dL (ref 30.0–36.0)
MCV: 89.5 fl (ref 78.0–100.0)
Monocytes Absolute: 0.3 10*3/uL (ref 0.1–1.0)
Monocytes Relative: 6 % (ref 3.0–12.0)
Neutro Abs: 2.6 10*3/uL (ref 1.4–7.7)
Neutrophils Relative %: 45.4 % (ref 43.0–77.0)
Platelets: 243 10*3/uL (ref 150.0–400.0)
RBC: 4.57 Mil/uL (ref 3.87–5.11)
RDW: 13.8 % (ref 11.5–15.5)
WBC: 5.6 10*3/uL (ref 4.0–10.5)

## 2018-12-07 LAB — COMPREHENSIVE METABOLIC PANEL
ALT: 19 U/L (ref 0–35)
AST: 18 U/L (ref 0–37)
Albumin: 4.7 g/dL (ref 3.5–5.2)
Alkaline Phosphatase: 65 U/L (ref 39–117)
BUN: 16 mg/dL (ref 6–23)
CO2: 28 mEq/L (ref 19–32)
Calcium: 9.7 mg/dL (ref 8.4–10.5)
Chloride: 101 mEq/L (ref 96–112)
Creatinine, Ser: 0.68 mg/dL (ref 0.40–1.20)
GFR: 89.29 mL/min (ref >60.00)
Glucose, Bld: 86 mg/dL (ref 70–99)
Potassium: 3.9 mEq/L (ref 3.5–5.1)
Sodium: 138 mEq/L (ref 135–145)
Total Bilirubin: 0.5 mg/dL (ref 0.2–1.2)
Total Protein: 7.2 g/dL (ref 6.0–8.3)

## 2018-12-07 LAB — TSH: TSH: 0.84 u[IU]/mL (ref 0.35–4.50)

## 2018-12-07 LAB — LIPID PANEL
Cholesterol: 210 mg/dL — ABNORMAL HIGH (ref 0–200)
HDL: 66 mg/dL (ref >39.00)
LDL Cholesterol: 115 mg/dL — ABNORMAL HIGH (ref 0–99)
NonHDL: 144.29
Total CHOL/HDL Ratio: 3
Triglycerides: 148 mg/dL (ref 0.0–149.0)
VLDL: 29.6 mg/dL (ref 0.0–40.0)

## 2018-12-07 LAB — T4, FREE: Free T4: 0.97 ng/dL (ref 0.60–1.60)

## 2018-12-08 ENCOUNTER — Encounter: Payer: BC Managed Care – PPO | Admitting: Internal Medicine

## 2018-12-08 LAB — URINALYSIS, ROUTINE W REFLEX MICROSCOPIC
Bilirubin Urine: NEGATIVE
Glucose, UA: NEGATIVE
Hgb urine dipstick: NEGATIVE
Ketones, ur: NEGATIVE
Leukocytes,Ua: NEGATIVE
Nitrite: NEGATIVE
Protein, ur: NEGATIVE
Specific Gravity, Urine: 1.022 (ref 1.001–1.03)
pH: 7 (ref 5.0–8.0)

## 2018-12-24 ENCOUNTER — Other Ambulatory Visit: Payer: Self-pay | Admitting: Internal Medicine

## 2018-12-24 DIAGNOSIS — K219 Gastro-esophageal reflux disease without esophagitis: Secondary | ICD-10-CM

## 2018-12-24 DIAGNOSIS — M1990 Unspecified osteoarthritis, unspecified site: Secondary | ICD-10-CM

## 2018-12-24 MED ORDER — CELECOXIB 200 MG PO CAPS: 200.0000 mg | ORAL_CAPSULE | Freq: Every day | ORAL | 3 refills | Status: DC

## 2018-12-24 MED ORDER — OMEPRAZOLE 20 MG PO CPDR: 20.0000 mg | DELAYED_RELEASE_CAPSULE | Freq: Every day | ORAL | 3 refills | Status: DC

## 2019-01-01 ENCOUNTER — Telehealth: Payer: BC Managed Care – PPO | Admitting: Physician Assistant

## 2019-01-01 DIAGNOSIS — R300 Dysuria: Secondary | ICD-10-CM

## 2019-01-01 MED ORDER — CEPHALEXIN 500 MG PO CAPS: 500.0000 mg | ORAL_CAPSULE | Freq: Two times a day (BID) | ORAL | 0 refills | Status: AC

## 2019-01-01 NOTE — Progress Notes (Signed)
We are sorry that you are not feeling well.  Here is how we plan to help!  Based on what you shared with me it looks like you most likely have a simple urinary tract infection.  A UTI (Urinary Tract Infection) is a bacterial infection of the bladder.  Most cases of urinary tract infections are simple to treat but a key part of your care is to encourage you to drink plenty of fluids and watch your symptoms carefully.  I have prescribed Keflex 500 mg twice a day for 7 days.  Your symptoms should gradually improve. You should notice a significant difference in the first 48 hours. I recommend seeking care with your primary care provider or urgent care if any of your symptoms worsen after starting the antibiotic, you develop a new fever, back pain or pelvic pain or if your symptoms do not fully resolve after completing the antibiotic. If you still notice any blood in your urine after completing the antibiotic, make sure to follow up with your primary care provider.   Urinary tract infections can be prevented by drinking plenty of water to keep your body hydrated.  Also be sure when you wipe, wipe from front to back and don't hold it in!  If possible, empty your bladder every 4 hours.  Your e-visit answers were reviewed by a board certified advanced clinical practitioner to complete your personal care plan.  Depending on the condition, your plan could have included both over the counter or prescription medications.  If there is a problem please reply  once you have received a response from your provider.  Your safety is important to [place].  If you have drug allergies check your prescription carefully.    You can use MyChart to ask questions about today's visit, request a non-urgent call back, or ask for a work or school excuse for 24 hours related to this e-Visit. If it has been greater than 24 hours you will need to follow up with your provider, or enter a new e-Visit to address those concerns.   You  will get an e-mail in the next two days asking about your experience.  I hope that your e-visit has been valuable and will speed your recovery. Thank you for using e-visits.  I have spent 5 minutes in review of e-visit questionnaire, review and updating patient chart, medical decision making and response to patient.    Tenna Delaine, PA-C

## 2019-01-04 ENCOUNTER — Other Ambulatory Visit: Payer: Self-pay

## 2019-01-06 ENCOUNTER — Ambulatory Visit (INDEPENDENT_AMBULATORY_CARE_PROVIDER_SITE_OTHER): Payer: BC Managed Care – PPO

## 2019-01-06 ENCOUNTER — Encounter: Payer: Self-pay | Admitting: Internal Medicine

## 2019-01-06 ENCOUNTER — Ambulatory Visit (INDEPENDENT_AMBULATORY_CARE_PROVIDER_SITE_OTHER): Payer: BC Managed Care – PPO | Admitting: Internal Medicine

## 2019-01-06 ENCOUNTER — Other Ambulatory Visit: Payer: Self-pay

## 2019-01-06 VITALS — BP 152/86 | HR 73 | Temp 98.2°F | Ht 65.0 in | Wt 190.2 lb

## 2019-01-06 DIAGNOSIS — Z1231 Encounter for screening mammogram for malignant neoplasm of breast: Secondary | ICD-10-CM

## 2019-01-06 DIAGNOSIS — F329 Major depressive disorder, single episode, unspecified: Secondary | ICD-10-CM

## 2019-01-06 DIAGNOSIS — Z0001 Encounter for general adult medical examination with abnormal findings: Secondary | ICD-10-CM

## 2019-01-06 DIAGNOSIS — M25552 Pain in left hip: Secondary | ICD-10-CM | POA: Diagnosis not present

## 2019-01-06 DIAGNOSIS — R200 Anesthesia of skin: Secondary | ICD-10-CM

## 2019-01-06 DIAGNOSIS — M25562 Pain in left knee: Secondary | ICD-10-CM

## 2019-01-06 DIAGNOSIS — F419 Anxiety disorder, unspecified: Secondary | ICD-10-CM | POA: Diagnosis not present

## 2019-01-06 DIAGNOSIS — N6091 Unspecified benign mammary dysplasia of right breast: Secondary | ICD-10-CM

## 2019-01-06 DIAGNOSIS — R319 Hematuria, unspecified: Secondary | ICD-10-CM

## 2019-01-06 DIAGNOSIS — M62838 Other muscle spasm: Secondary | ICD-10-CM

## 2019-01-06 DIAGNOSIS — Z23 Encounter for immunization: Secondary | ICD-10-CM

## 2019-01-06 DIAGNOSIS — M542 Cervicalgia: Secondary | ICD-10-CM

## 2019-01-06 DIAGNOSIS — N644 Mastodynia: Secondary | ICD-10-CM

## 2019-01-06 DIAGNOSIS — R232 Flushing: Secondary | ICD-10-CM

## 2019-01-06 DIAGNOSIS — F32A Depression, unspecified: Secondary | ICD-10-CM

## 2019-01-06 DIAGNOSIS — R030 Elevated blood-pressure reading, without diagnosis of hypertension: Secondary | ICD-10-CM

## 2019-01-06 MED ORDER — CYCLOBENZAPRINE HCL 10 MG PO TABS: ORAL_TABLET | ORAL | 2 refills | Status: DC

## 2019-01-06 MED ORDER — ALPRAZOLAM 0.25 MG PO TABS: 0.2500 mg | ORAL_TABLET | Freq: Every day | ORAL | 2 refills | Status: DC | PRN

## 2019-01-06 MED ORDER — VENLAFAXINE HCL ER 37.5 MG PO CP24: 37.5000 mg | ORAL_CAPSULE | Freq: Every day | ORAL | 3 refills | Status: DC

## 2019-01-06 NOTE — Progress Notes (Signed)
Chief Complaint  Patient presents with  . Annual Exam   Annual  1. Still c/o right breast pain inner breast with h/o atypical ductal hyperplasia right breast 09/14/18 right breast dx mammo negative  2. 01/01/19 E visit appt for hematuria and dysuria on keflex sxs better but will check urine today  3. C/o increased anxiety due to family related stress meds ok effexor 37.5 and rarely using xanax 4. C/o left lateral knee pain, left hip pain and left lateral thigh/knee numbness new since last visit. Nothing tried. Pain is 3/10 with h/o arthritis elsewhere on celebrex 200 mg qd  5. Elevated BP today 152/86 last visit 136/78   Review of Systems  Constitutional: Positive for weight loss.       Down 4 lbs    HENT: Negative for hearing loss.   Eyes: Negative for blurred vision.  Respiratory: Negative for shortness of breath.   Cardiovascular: Negative for chest pain.  Gastrointestinal: Negative for abdominal pain.  Genitourinary: Positive for dysuria and hematuria.       +right breast pain    Musculoskeletal: Positive for joint pain.  Skin: Negative for rash.  Neurological: Negative for headaches.  Psychiatric/Behavioral: The patient is nervous/anxious.    Past Medical History:  Diagnosis Date  . Anxiety   . Arthritis    hips, low back  . Breast mass, right   . Chicken pox   . GERD (gastroesophageal reflux disease)   . Plantar fasciitis   . PONV (postoperative nausea and vomiting)   . Vertigo    Past Surgical History:  Procedure Laterality Date  . BREAST BIOPSY Right 10/17/2015   stereo/atypical ductal hyperplasia  . BREAST BIOPSY Left 11/24/2017   atypical ductal hyperplasia x left  . BREAST EXCISIONAL BIOPSY Right 2017   atypical ductal hyperplasia  . BREAST LUMPECTOMY Right 12/04/2015   ADH  . BREAST SURGERY Bilateral 2010   Reduction   . CARPAL TUNNEL RELEASE     Unsure of date and which laterality   . CERVICAL SPINE SURGERY  2012   Discectomy and fusion of C4,5, and 6  Dr. Kendal Hymen   . LASIK Bilateral 2001  . RADIOACTIVE SEED GUIDED EXCISIONAL BREAST BIOPSY Right 12/04/2015   Procedure: RADIOACTIVE SEED GUIDED EXCISIONAL BREAST BIOPSY;  Surgeon: Rolm Bookbinder, MD;  Location: [facility];  Service: General;  Laterality: Right;  . REDUCTION MAMMAPLASTY Bilateral 2010  . SHOULDER SURGERY Left 1982   bone spur removed/ bone separation repair  . thumb joint replacement     L thumb Dr. Amedeo Plenty   . TONSILLECTOMY AND ADENOIDECTOMY  1967   Family History  Problem Relation Age of Onset  . Arthritis Mother   . Hypertension Mother   . Kidney disease Mother   . Alcohol abuse Father   . Arthritis Father   . Lung cancer Father   . Hypertension Father   . Cancer Father        lung smoker   . Alcohol abuse Brother   . Hypertension Brother   . Arthritis Maternal Aunt   . Cancer Maternal Aunt        melanoma  . Arthritis Maternal Uncle   . Lung cancer Maternal Uncle   . Cancer Maternal Uncle        bladder not smoker   . Colon cancer Paternal Uncle   . Cancer Paternal Uncle        colon cancer older age 78s died in 69s  . Lung cancer Maternal  Grandmother   . Hypertension Maternal Grandmother   . Cancer Maternal Grandmother        lung not smoker   . Rectal cancer Cousin        46 had rectal cancer   . Breast cancer Cousin 76   Social History   Socioeconomic History  . Marital status: Married    Spouse name: Not on file  . Number of children: Not on file  . Years of education: Not on file  . Highest education level: Not on file  Occupational History  . Not on file  Social Needs  . Financial resource strain: Not on file  . Food insecurity    Worry: Not on file    Inability: Not on file  . Transportation needs    Medical: Not on file    Non-medical: Not on file  Tobacco Use  . Smoking status: Never Smoker  . Smokeless tobacco: Never Used  . Tobacco comment: socially in college, 1 pk lasted 1 month  Substance and Sexual  Activity  . Alcohol use: Yes    Alcohol/week: 0.0 - 1.0 standard drinks    Comment: social  . Drug use: No  . Sexual activity: Yes  Lifestyle  . Physical activity    Days per week: Not on file    Minutes per session: Not on file  . Stress: Not on file  Relationships  . Social Herbalist on phone: Not on file    Gets together: Not on file    Attends religious service: Not on file    Active member of club or organization: Not on file    Attends meetings of clubs or organizations: Not on file    Relationship status: Not on file  . Intimate partner violence    Fear of current or ex partner: Not on file    Emotionally abused: Not on file    Physically abused: Not on file    Forced sexual activity: Not on file  Other Topics Concern  . 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 ALPRAZolam (XANAX) 0.25 MG tablet Take 1 tablet (0.25 mg total) by mouth daily as needed for anxiety.  . Ascorbic Acid (VITAMIN C) 1000 MG tablet Take 1,000 mg by mouth daily.  . Biotin (BIOTIN 5000) 5 MG CAPS Take 5,000 mg by mouth daily.  . Black Pepper-Turmeric (TURMERIC CURCUMIN) 09-998 MG CAPS   . celecoxib (CELEBREX) 200 MG capsule Take 1 capsule (200 mg total) by mouth daily.  . cephALEXin (KEFLEX) 500 MG capsule Take 1 capsule (500 mg total) by mouth 2 (two) times daily for 7 days.  . cyclobenzaprine (FLEXERIL) 10 MG tablet TAKE 1 TABLET (10 MG TOTAL) BY MOUTH AT BEDTIME prn  . meclizine (ANTIVERT) 25 MG tablet Take 25 mg by mouth 3 (three) times daily as needed for dizziness.  . Multiple Vitamins-Minerals (WOMENS MULTIVITAMIN PLUS PO) Take by mouth once.  . Omega-3 Fatty Acids (FISH OIL) 1000 MG CAPS Take by mouth.  Marland Kitchen omeprazole (PRILOSEC) 20 MG capsule Take 1 capsule (20 mg total) by mouth daily. 30 minutes before food  . venlafaxine XR (EFFEXOR  XR) 37.5 MG 24 hr capsule Take 1 capsule (37.5 mg total) by mouth daily with breakfast.  . [DISCONTINUED] ALPRAZolam (  XANAX) 0.25 MG tablet Take 1 tablet (0.25 mg total) by mouth daily as needed for anxiety.  . [DISCONTINUED] cyclobenzaprine (FLEXERIL) 10 MG tablet TAKE 1 TABLET (10 MG TOTAL) BY MOUTH AT BEDTIME prn  . [DISCONTINUED] venlafaxine XR (EFFEXOR XR) 37.5 MG 24 hr capsule Take 1 capsule (37.5 mg total) by mouth daily with breakfast.   No Known Allergies Recent Results (from the past 2160 hour(s))  Urinalysis, Routine w reflex microscopic     Status: None   Collection Time: 12/07/18 10:12 AM  Result Value Ref Range   Color, Urine YELLOW YELLOW   APPearance CLEAR CLEAR   Specific Gravity, Urine 1.022 1.001 - 1.03   pH 7.0 5.0 - 8.0   Glucose, UA NEGATIVE NEGATIVE   Bilirubin Urine NEGATIVE NEGATIVE   Ketones, ur NEGATIVE NEGATIVE   Hgb urine dipstick NEGATIVE NEGATIVE   Protein, ur NEGATIVE NEGATIVE   Nitrite NEGATIVE NEGATIVE   Leukocytes,Ua NEGATIVE NEGATIVE  TSH     Status: None   Collection Time: 12/07/18 10:12 AM  Result Value Ref Range   TSH 0.84 0.35 - 4.50 uIU/mL  T4, free     Status: None   Collection Time: 12/07/18 10:12 AM  Result Value Ref Range   Free T4 0.97 0.60 - 1.60 ng/dL    Comment: Specimens from patients who are undergoing biotin therapy and /or ingesting biotin supplements may contain high levels of biotin.  The higher biotin concentration in these specimens interferes with this Free T4 assay.  Specimens that contain high levels  of biotin may cause false high results for this Free T4 assay.  Please interpret results in light of the total clinical presentation of the patient.    Lipid panel     Status: Abnormal   Collection Time: 12/07/18 10:12 AM  Result Value Ref Range   Cholesterol 210 (H) 0 - 200 mg/dL    Comment: ATP III Classification       Desirable:  < 200 mg/dL               Borderline High:  200 - 239 mg/dL          High:  > = 240 mg/dL    Triglycerides 148.0 0.0 - 149.0 mg/dL    Comment: Normal:  <150 mg/dLBorderline High:  150 - 199 mg/dL   HDL 66.00 >39.00 mg/dL   VLDL 29.6 0.0 - 40.0 mg/dL   LDL Cholesterol 115 (H) 0 - 99 mg/dL   Total CHOL/HDL Ratio 3     Comment:                Men          Women1/2 Average Risk     3.4          3.3Average Risk          5.0          4.42X Average Risk          9.6          7.13X Average Risk          15.0          11.0                       NonHDL 144.29     Comment: NOTE:  Non-HDL goal should be 30 mg/dL higher than patient's LDL goal (i.e. LDL goal of < 70 mg/dL, would have non-HDL goal of < 100 mg/dL)  CBC w/Diff     Status: None   Collection Time: 12/07/18 10:12 AM  Result Value Ref Range   WBC 5.6 4.0 - 10.5 K/uL   RBC 4.57 3.87 - 5.11 Mil/uL   Hemoglobin 13.7 12.0 - 15.0 g/dL   HCT 40.9 36.0 - 46.0 %   MCV 89.5 78.0 - 100.0 fl   MCHC 33.6 30.0 - 36.0 g/dL   RDW 13.8 11.5 - 15.5 %   Platelets 243.0 150.0 - 400.0 K/uL   Neutrophils Relative % 45.4 43.0 - 77.0 %   Lymphocytes Relative 45.8 12.0 - 46.0 %   Monocytes Relative 6.0 3.0 - 12.0 %   Eosinophils Relative 1.6 0.0 - 5.0 %   Basophils Relative 1.2 0.0 - 3.0 %   Neutro Abs 2.6 1.4 - 7.7 K/uL   Lymphs Abs 2.6 0.7 - 4.0 K/uL   Monocytes Absolute 0.3 0.1 - 1.0 K/uL   Eosinophils Absolute 0.1 0.0 - 0.7 K/uL   Basophils Absolute 0.1 0.0 - 0.1 K/uL  Comprehensive metabolic panel     Status: None   Collection Time: 12/07/18 10:12 AM  Result Value Ref Range   Sodium 138 135 - 145 mEq/L   Potassium 3.9 3.5 - 5.1 mEq/L   Chloride 101 96 - 112 mEq/L   CO2 28 19 - 32 mEq/L   Glucose, Bld 86 70 - 99 mg/dL   BUN 16 6 - 23 mg/dL   Creatinine, Ser 0.68 0.40 - 1.20 mg/dL   Total Bilirubin 0.5 0.2 - 1.2 mg/dL   Alkaline Phosphatase 65 39 - 117 U/L   AST 18 0 - 37 U/L   ALT 19 0 - 35 U/L   Total Protein 7.2 6.0 - 8.3 g/dL   Albumin 4.7 3.5 - 5.2 g/dL   Calcium 9.7 8.4 - 10.5 mg/dL   GFR 89.29 >60.00 mL/min  Urinalysis,  Routine w reflex microscopic     Status: None   Collection Time: 01/06/19  4:08 PM  Result Value Ref Range   Color, Urine DARK YELLOW YELLOW   APPearance CLEAR CLEAR   Specific Gravity, Urine 1.008 1.001 - 1.03   pH 5.5 5.0 - 8.0   Glucose, UA NEGATIVE NEGATIVE   Bilirubin Urine NEGATIVE NEGATIVE   Ketones, ur NEGATIVE NEGATIVE   Hgb urine dipstick NEGATIVE NEGATIVE   Protein, ur NEGATIVE NEGATIVE   Nitrite NEGATIVE NEGATIVE   Leukocytes,Ua NEGATIVE NEGATIVE   Objective  Body mass index is 31.65 kg/m. Wt Readings from Last 3 Encounters:  01/06/19 190 lb 3.2 oz (86.3 kg)  03/05/18 194 lb (88 kg)  12/18/17 191 lb (86.6 kg)   Temp Readings from Last 3 Encounters:  01/06/19 98.2 F (36.8 C) (Oral)  10/28/18 98.4 F (36.9 C)  03/05/18 98.3 F (36.8 C) (Oral)   BP Readings from Last 3 Encounters:  01/06/19 (!) 152/86  03/05/18 136/78  12/18/17 110/80   Pulse Readings from Last 3 Encounters:  01/06/19 73  03/05/18 (!) 58  12/18/17 67    Physical Exam Vitals signs and nursing note reviewed.  Constitutional:      Appearance: Normal appearance. She is well-developed and well-groomed. She is obese.  HENT:     Head: Normocephalic and atraumatic.     Comments: +mask on   Cardiovascular:     Rate and Rhythm: Normal rate and regular rhythm.     Heart sounds: Normal heart sounds. No murmur.  Pulmonary:     Effort: Pulmonary effort is normal.  Breath sounds: Normal breath sounds.  Chest:     Breasts:        Right: Tenderness present. No swelling, bleeding, inverted nipple, mass, nipple discharge or skin change.        Left: Normal. No swelling, bleeding, inverted nipple, mass, nipple discharge, skin change or tenderness.    Musculoskeletal:     Left knee: Tenderness found. Lateral joint line tenderness noted.  Lymphadenopathy:     Upper Body:     Right upper body: No axillary adenopathy.     Left upper body: No axillary adenopathy.  Skin:    General: Skin is  warm and dry.  Neurological:     General: No focal deficit present.     Mental Status: She is alert and oriented to person, place, and time. Mental status is at baseline.     Gait: Gait normal.  Psychiatric:        Attention and Perception: Attention and perception normal.        Mood and Affect: Mood and affect normal.        Speech: Speech normal.        Behavior: Behavior normal. Behavior is cooperative.        Thought Content: Thought content normal.        Cognition and Memory: Cognition and memory normal.        Judgment: Judgment normal.     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 Loraine Maple GI negative repeat in 10 years scanned under media   Dermatology Dr. Kellie Moor saw 03/2017 due to f/u 11/2019saw and f/u due 03/2019   DEXA age 58 y.o    Anxiety and depression/menopause hot flashes- Plan: venlafaxine XR (EFFEXOR XR) 37.5 MG 24 hr capsule, prn Xanax    Neck pain/spasm - Rx RF flexeril. Pt takes sparingly.  - Plan: cyclobenzaprine (FLEXERIL) 10 MG tablet on celebrex continues   Left hip pain - Plan: DG Hip Unilat W OR W/O Pelvis 2-3 Views Left  Acute pain of left knee - Plan: DG Knee Complete 4 Views Left Numbness of left thigh ddx meralgia parathesthica vs related to possibly left knee etiology    Hematuria, unspecified type - Plan: Urinalysis, Routine w reflex microscopic, Urine Culture -currently on keflex if continues and urine culture negative consider CT  renal  Elevated BP  -monitor at f/u  -if elevated again will need Rx meds   Provider: Dr. Olivia Mackie Mc[facility]

## 2019-01-06 NOTE — Patient Instructions (Signed)
shingrix vaccine x 2 doses  Zoster Vaccine, Recombinant injection What is this medicine? ZOSTER VACCINE (ZOS ter vak SEEN) is used to prevent shingles in adults 57 years old and over. This vaccine is not used to treat shingles or nerve pain from shingles. This medicine may be used for other purposes; ask your health care provider or pharmacist if you have questions. COMMON BRAND NAME(S): [facility] What should I tell my health care provider before I take this medicine? They need to know if you have any of these conditions:  blood disorders or disease  cancer like leukemia or lymphoma  immune system problems or therapy  an unusual or allergic reaction to vaccines, other medications, foods, dyes, or preservatives  pregnant or trying to get pregnant  breast-feeding How should I use this medicine? This vaccine is for injection in a muscle. It is given by a health care professional. Talk to your pediatrician regarding the use of this medicine in children. This medicine is not approved for use in children. Overdosage: If you think you have taken too much of this medicine contact a poison control center or emergency room at once. NOTE: This medicine is only for you. Do not share this medicine with others. What if I miss a dose? Keep appointments for follow-up (booster) doses as directed. It is important not to miss your dose. Call your doctor or health care professional if you are unable to keep an appointment. What may interact with this medicine?  medicines that suppress your immune system  medicines to treat cancer  steroid medicines like prednisone or cortisone This list may not describe all possible interactions. Give your health care provider a list of all the medicines, herbs, non-prescription drugs, or dietary supplements you use. Also tell them if you smoke, drink alcohol, or use illegal drugs. Some items may interact with your medicine. What should I watch for while using this  medicine? Visit your doctor for regular check ups. This vaccine, like all vaccines, may not fully protect everyone. What side effects may I notice from receiving this medicine? Side effects that you should report to your doctor or health care professional as soon as possible:  allergic reactions like skin rash, itching or hives, swelling of the face, lips, or tongue  breathing problems Side effects that usually do not require medical attention (report these to your doctor or health care professional if they continue or are bothersome):  chills  headache  fever  nausea, vomiting  redness, warmth, pain, swelling or itching at site where injected  tiredness This list may not describe all possible side effects. Call your doctor for medical advice about side effects. You may report side effects to FDA at 1-800-FDA-1088. Where should I keep my medicine? This vaccine is only given in a clinic, pharmacy, doctor's office, or other health care setting and will not be stored at home. NOTE: This sheet is a summary. It may not cover all possible information. If you have questions about this medicine, talk to your doctor, pharmacist, or health care provider.  2020 Elsevier/Gold Standard (2016-12-02 13:20:30)

## 2019-01-07 ENCOUNTER — Telehealth: Payer: Self-pay | Admitting: Internal Medicine

## 2019-01-07 DIAGNOSIS — M25562 Pain in left knee: Secondary | ICD-10-CM | POA: Insufficient documentation

## 2019-01-07 DIAGNOSIS — R319 Hematuria, unspecified: Secondary | ICD-10-CM | POA: Insufficient documentation

## 2019-01-07 DIAGNOSIS — M25552 Pain in left hip: Secondary | ICD-10-CM | POA: Insufficient documentation

## 2019-01-07 DIAGNOSIS — N644 Mastodynia: Secondary | ICD-10-CM | POA: Insufficient documentation

## 2019-01-07 NOTE — Telephone Encounter (Signed)
Call pt [facility] and [place] sch 01/12/2019 at 2 PM at [facility] call them to reschedule if needed   Also if this is negative you will need breast MRI   [place]

## 2019-01-07 NOTE — Telephone Encounter (Signed)
mychart sent to patient to inform her.

## 2019-01-08 ENCOUNTER — Other Ambulatory Visit: Payer: Self-pay | Admitting: Internal Medicine

## 2019-01-08 DIAGNOSIS — R200 Anesthesia of skin: Secondary | ICD-10-CM

## 2019-01-08 DIAGNOSIS — M5416 Radiculopathy, lumbar region: Secondary | ICD-10-CM

## 2019-01-08 LAB — URINE CULTURE
MICRO NUMBER:: 840131
Result:: NO GROWTH
SPECIMEN QUALITY:: ADEQUATE

## 2019-01-08 LAB — URINALYSIS, ROUTINE W REFLEX MICROSCOPIC
Bilirubin Urine: NEGATIVE
Glucose, UA: NEGATIVE
Hgb urine dipstick: NEGATIVE
Ketones, ur: NEGATIVE
Leukocytes,Ua: NEGATIVE
Nitrite: NEGATIVE
Protein, ur: NEGATIVE
Specific Gravity, Urine: 1.008 (ref 1.001–1.03)
pH: 5.5 (ref 5.0–8.0)

## 2019-01-12 ENCOUNTER — Other Ambulatory Visit: Payer: BC Managed Care – PPO

## 2019-01-19 ENCOUNTER — Ambulatory Visit
Admission: RE | Admit: 2019-01-19 | Discharge: 2019-01-19 | Disposition: A | Discharge status: Home / Self Care | Payer: BC Managed Care – PPO | Source: Ambulatory Visit | Attending: Internal Medicine | Admitting: Internal Medicine

## 2019-01-19 ENCOUNTER — Encounter: Payer: Self-pay | Admitting: Radiology

## 2019-01-19 DIAGNOSIS — Z1231 Encounter for screening mammogram for malignant neoplasm of breast: Secondary | ICD-10-CM

## 2019-01-19 DIAGNOSIS — N644 Mastodynia: Secondary | ICD-10-CM | POA: Diagnosis present

## 2019-01-19 DIAGNOSIS — N6091 Unspecified benign mammary dysplasia of right breast: Secondary | ICD-10-CM

## 2019-01-27 ENCOUNTER — Ambulatory Visit
Admission: RE | Admit: 2019-01-27 | Discharge: 2019-01-27 | Disposition: A | Discharge status: Home / Self Care | Payer: BC Managed Care – PPO | Source: Ambulatory Visit | Attending: Internal Medicine | Admitting: Internal Medicine

## 2019-01-27 ENCOUNTER — Other Ambulatory Visit: Payer: Self-pay

## 2019-01-27 DIAGNOSIS — M5416 Radiculopathy, lumbar region: Secondary | ICD-10-CM | POA: Diagnosis present

## 2019-01-27 DIAGNOSIS — R200 Anesthesia of skin: Secondary | ICD-10-CM | POA: Diagnosis present

## 2019-02-25 ENCOUNTER — Encounter: Payer: Self-pay | Admitting: Internal Medicine

## 2019-02-25 DIAGNOSIS — F419 Anxiety disorder, unspecified: Secondary | ICD-10-CM

## 2019-02-25 DIAGNOSIS — R232 Flushing: Secondary | ICD-10-CM

## 2019-02-25 DIAGNOSIS — F329 Major depressive disorder, single episode, unspecified: Secondary | ICD-10-CM

## 2019-02-25 MED ORDER — VENLAFAXINE HCL ER 37.5 MG PO CP24: 37.5000 mg | ORAL_CAPSULE | Freq: Every day | ORAL | 3 refills | Status: DC

## 2019-03-24 ENCOUNTER — Encounter: Payer: Self-pay | Admitting: Podiatry

## 2019-03-24 ENCOUNTER — Other Ambulatory Visit: Payer: Self-pay

## 2019-03-24 ENCOUNTER — Ambulatory Visit: Payer: BC Managed Care – PPO | Admitting: Podiatry

## 2019-03-24 DIAGNOSIS — M722 Plantar fascial fibromatosis: Secondary | ICD-10-CM

## 2019-03-24 NOTE — Progress Notes (Signed)
She presents today for flareup of her plantar fasciitis to the right foot.  She states been going on now for about 2 weeks it really hurts right at here she points to the lateral aspect of the right heel.  She states is very painful when she gets up in the mornings and she is requesting an injection.  Objective: Vital signs are stable she alert oriented x3.  Pulses are palpable.  He has pain on palpation medial calcaneal tubercle central calcaneal tubercle and mostly laterally.  Assessment: Plantar fasciitis with lateral compensatory syndrome.  Plan: After sterile Betadine skin prep I injected 20 mg Kenalog 5 mg Marcaine point maximal tenderness of the right heel medially.  She will follow up with [facility] for orthotic modification.

## 2019-04-05 ENCOUNTER — Encounter: Payer: Self-pay | Admitting: Internal Medicine

## 2019-04-08 ENCOUNTER — Ambulatory Visit: Payer: BC Managed Care – PPO | Admitting: Internal Medicine

## 2019-05-31 ENCOUNTER — Encounter: Payer: Self-pay | Admitting: Internal Medicine

## 2019-06-18 ENCOUNTER — Ambulatory Visit: Payer: BC Managed Care – PPO | Admitting: Internal Medicine

## 2019-07-27 ENCOUNTER — Encounter: Payer: Self-pay | Admitting: Internal Medicine

## 2019-07-30 ENCOUNTER — Encounter: Payer: Self-pay | Admitting: Internal Medicine

## 2019-08-02 NOTE — Addendum Note (Signed)
Addended by: Quentin Ore on: 08/02/2019 09:02 AM   Modules accepted: Orders

## 2019-08-11 ENCOUNTER — Telehealth: Payer: Self-pay | Admitting: Internal Medicine

## 2019-08-11 NOTE — Telephone Encounter (Signed)
CD placed up front for pick up & pt notified 

## 2019-08-11 NOTE — Telephone Encounter (Signed)
Pt called she is needing a copy of her xray from 01/06/19 for her Ortho appt on 4/21 Please let pt know when ready

## 2019-09-29 DIAGNOSIS — M179 Osteoarthritis of knee, unspecified: Secondary | ICD-10-CM | POA: Insufficient documentation

## 2019-09-29 DIAGNOSIS — M7062 Trochanteric bursitis, left hip: Secondary | ICD-10-CM | POA: Insufficient documentation

## 2019-12-15 ENCOUNTER — Other Ambulatory Visit: Payer: Self-pay | Admitting: Internal Medicine

## 2019-12-15 DIAGNOSIS — Z1231 Encounter for screening mammogram for malignant neoplasm of breast: Secondary | ICD-10-CM

## 2019-12-24 ENCOUNTER — Other Ambulatory Visit: Payer: Self-pay | Admitting: Internal Medicine

## 2019-12-24 DIAGNOSIS — K219 Gastro-esophageal reflux disease without esophagitis: Secondary | ICD-10-CM

## 2019-12-24 DIAGNOSIS — M1990 Unspecified osteoarthritis, unspecified site: Secondary | ICD-10-CM

## 2019-12-24 MED ORDER — CELECOXIB 200 MG PO CAPS: 200.0000 mg | ORAL_CAPSULE | Freq: Every day | ORAL | 3 refills | Status: DC

## 2019-12-24 MED ORDER — OMEPRAZOLE 20 MG PO CPDR: 20.0000 mg | DELAYED_RELEASE_CAPSULE | Freq: Every day | ORAL | 3 refills | Status: DC

## 2020-01-14 ENCOUNTER — Encounter: Payer: Self-pay | Admitting: Internal Medicine

## 2020-01-14 ENCOUNTER — Ambulatory Visit (INDEPENDENT_AMBULATORY_CARE_PROVIDER_SITE_OTHER): Payer: BC Managed Care – PPO | Admitting: Internal Medicine

## 2020-01-14 ENCOUNTER — Other Ambulatory Visit: Payer: Self-pay

## 2020-01-14 VITALS — BP 122/80 | HR 81 | Temp 98.4°F | Ht 63.58 in | Wt 147.4 lb

## 2020-01-14 DIAGNOSIS — D709 Neutropenia, unspecified: Secondary | ICD-10-CM

## 2020-01-14 DIAGNOSIS — F32A Depression, unspecified: Secondary | ICD-10-CM

## 2020-01-14 DIAGNOSIS — L309 Dermatitis, unspecified: Secondary | ICD-10-CM

## 2020-01-14 DIAGNOSIS — Z1329 Encounter for screening for other suspected endocrine disorder: Secondary | ICD-10-CM

## 2020-01-14 DIAGNOSIS — R232 Flushing: Secondary | ICD-10-CM

## 2020-01-14 DIAGNOSIS — Z23 Encounter for immunization: Secondary | ICD-10-CM | POA: Diagnosis not present

## 2020-01-14 DIAGNOSIS — Z780 Asymptomatic menopausal state: Secondary | ICD-10-CM

## 2020-01-14 DIAGNOSIS — F329 Major depressive disorder, single episode, unspecified: Secondary | ICD-10-CM

## 2020-01-14 DIAGNOSIS — Z0000 Encounter for general adult medical examination without abnormal findings: Secondary | ICD-10-CM | POA: Diagnosis not present

## 2020-01-14 DIAGNOSIS — Z1389 Encounter for screening for other disorder: Secondary | ICD-10-CM

## 2020-01-14 DIAGNOSIS — R6882 Decreased libido: Secondary | ICD-10-CM

## 2020-01-14 DIAGNOSIS — F419 Anxiety disorder, unspecified: Secondary | ICD-10-CM

## 2020-01-14 DIAGNOSIS — D72829 Elevated white blood cell count, unspecified: Secondary | ICD-10-CM

## 2020-01-14 LAB — CBC WITH DIFFERENTIAL/PLATELET
Basophils Absolute: 0 10*3/uL (ref 0.0–0.1)
Basophils Relative: 1.3 % (ref 0.0–3.0)
Eosinophils Absolute: 0.1 10*3/uL (ref 0.0–0.7)
Eosinophils Relative: 2.5 % (ref 0.0–5.0)
HCT: 40.5 % (ref 36.0–46.0)
Hemoglobin: 13.6 g/dL (ref 12.0–15.0)
Lymphocytes Relative: 53.9 % — ABNORMAL HIGH (ref 12.0–46.0)
Lymphs Abs: 2 10*3/uL (ref 0.7–4.0)
MCHC: 33.5 g/dL (ref 30.0–36.0)
MCV: 90.8 fl (ref 78.0–100.0)
Monocytes Absolute: 0.3 10*3/uL (ref 0.1–1.0)
Monocytes Relative: 8.9 % (ref 3.0–12.0)
Neutro Abs: 1.2 10*3/uL — ABNORMAL LOW (ref 1.4–7.7)
Neutrophils Relative %: 33.4 % — ABNORMAL LOW (ref 43.0–77.0)
Platelets: 205 10*3/uL (ref 150.0–400.0)
RBC: 4.46 Mil/uL (ref 3.87–5.11)
RDW: 13.2 % (ref 11.5–15.5)
WBC: 3.6 10*3/uL — ABNORMAL LOW (ref 4.0–10.5)

## 2020-01-14 LAB — COMPREHENSIVE METABOLIC PANEL
ALT: 22 U/L (ref 0–35)
AST: 21 U/L (ref 0–37)
Albumin: 4.7 g/dL (ref 3.5–5.2)
Alkaline Phosphatase: 46 U/L (ref 39–117)
BUN: 13 mg/dL (ref 6–23)
CO2: 33 mEq/L — ABNORMAL HIGH (ref 19–32)
Calcium: 9.6 mg/dL (ref 8.4–10.5)
Chloride: 101 mEq/L (ref 96–112)
Creatinine, Ser: 0.71 mg/dL (ref 0.40–1.20)
GFR: 84.61 mL/min (ref >60.00)
Glucose, Bld: 84 mg/dL (ref 70–99)
Potassium: 4.2 mEq/L (ref 3.5–5.1)
Sodium: 139 mEq/L (ref 135–145)
Total Bilirubin: 0.4 mg/dL (ref 0.2–1.2)
Total Protein: 7.1 g/dL (ref 6.0–8.3)

## 2020-01-14 LAB — TSH: TSH: 0.77 u[IU]/mL (ref 0.35–4.50)

## 2020-01-14 LAB — LIPID PANEL
Cholesterol: 170 mg/dL (ref 0–200)
HDL: 66.9 mg/dL (ref >39.00)
LDL Cholesterol: 92 mg/dL (ref 0–99)
NonHDL: 103.22
Total CHOL/HDL Ratio: 3
Triglycerides: 54 mg/dL (ref 0.0–149.0)
VLDL: 10.8 mg/dL (ref 0.0–40.0)

## 2020-01-14 LAB — FOLLICLE STIMULATING HORMONE: FSH: 116.3 m[IU]/mL

## 2020-01-14 MED ORDER — ZOSTER VAC RECOMB ADJUVANTED 50 MCG/0.5ML IM SUSR: 0.5000 mL | Freq: Once | INTRAMUSCULAR | 0 refills | Status: AC

## 2020-01-14 MED ORDER — VENLAFAXINE HCL ER 37.5 MG PO CP24: 37.5000 mg | ORAL_CAPSULE | Freq: Every day | ORAL | 3 refills | Status: DC

## 2020-01-14 MED ORDER — TRIAMCINOLONE ACETONIDE 0.1 % EX CREA: 1.0000 "application " | TOPICAL_CREAM | Freq: Two times a day (BID) | CUTANEOUS | 0 refills | Status: DC

## 2020-01-14 MED ORDER — ALPRAZOLAM 0.25 MG PO TABS: 0.2500 mg | ORAL_TABLET | Freq: Every day | ORAL | 2 refills | Status: DC | PRN

## 2020-01-14 NOTE — Progress Notes (Signed)
Chief Complaint  Patient presents with  . Annual Exam   Annual doing  Well wt loss goal 130s  1. C/o irritability on effexor 37.5 mg qd and xanax 0.25 she wants hormones checked saw therapy 10 years ago declines for now and psych   2. Back itching mid back not sure cause   Review of Systems  Constitutional: Negative for weight loss.  HENT: Negative for hearing loss.   Eyes: Negative for blurred vision.  Respiratory: Negative for shortness of breath.   Cardiovascular: Negative for chest pain.  Gastrointestinal: Negative for abdominal pain.  Musculoskeletal: Negative for falls.  Skin: Negative for rash.  Neurological: Negative for headaches.  Psychiatric/Behavioral: The patient is not nervous/anxious.        Irritable   Past Medical History:  Diagnosis Date  . Anxiety   . Arthritis    hips, low back  . Breast mass, right   . Chicken pox   . GERD (gastroesophageal reflux disease)   . Plantar fasciitis   . PONV (postoperative nausea and vomiting)   . Vertigo    Past Surgical History:  Procedure Laterality Date  . BREAST BIOPSY Right 10/17/2015   stereo/atypical ductal hyperplasia  . BREAST BIOPSY Left 11/24/2017   PSEUDO-ANGIOMATOUS STROMAL HYPERPLASIA  . BREAST EXCISIONAL BIOPSY Right 2017   atypical ductal hyperplasia  . BREAST LUMPECTOMY Right 12/04/2015   ADH  . BREAST SURGERY Bilateral 2010   Reduction   . CARPAL TUNNEL RELEASE     Unsure of date and which laterality   . CERVICAL SPINE SURGERY  2012   Discectomy and fusion of C4,5, and 6 Dr. Kendal Hymen   . LASIK Bilateral 2001  . RADIOACTIVE SEED GUIDED EXCISIONAL BREAST BIOPSY Right 12/04/2015   Procedure: RADIOACTIVE SEED GUIDED EXCISIONAL BREAST BIOPSY;  Surgeon: Rolm Bookbinder, MD;  Location: [facility];  Service: General;  Laterality: Right;  . REDUCTION MAMMAPLASTY Bilateral 2010  . SHOULDER SURGERY Left 1982   bone spur removed/ bone separation repair  . thumb joint replacement     L thumb  Dr. Amedeo Plenty   . TONSILLECTOMY AND ADENOIDECTOMY  1967   Family History  Problem Relation Age of Onset  . Arthritis Mother   . Hypertension Mother   . Kidney disease Mother   . Alcohol abuse Father   . Arthritis Father   . Lung cancer Father   . Hypertension Father   . Cancer Father        lung smoker   . Alcohol abuse Brother   . Hypertension Brother   . Arthritis Maternal Aunt   . Cancer Maternal Aunt        melanoma  . Arthritis Maternal Uncle   . Lung cancer Maternal Uncle   . Cancer Maternal Uncle        bladder not smoker   . Colon cancer Paternal Uncle   . Cancer Paternal Uncle        colon cancer older age 25s died in 39s  . Lung cancer Maternal Grandmother   . Hypertension Maternal Grandmother   . Cancer Maternal Grandmother        lung not smoker   . Rectal cancer Cousin        64 had rectal cancer   . Breast cancer Cousin 37   Social History   Socioeconomic History  . Marital status: Married    Spouse name: Not on file  . Number of children: Not on file  . Years of education:  Not on file  . Highest education level: Not on file  Occupational History  . Not on file  Tobacco Use  . Smoking status: Never Smoker  . Smokeless tobacco: Never Used  . Tobacco comment: socially in college, 1 pk lasted 1 month  Vaping Use  . Vaping Use: Never used  Substance and Sexual Activity  . Alcohol use: Yes    Alcohol/week: 0.0 - 1.0 standard drinks    Comment: social  . Drug use: No  . Sexual activity: Yes  Other Topics Concern  . 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 Difficulty of Paying Living Expenses: Not on file  Food Insecurity:   . Worried About Charity fundraiser in the Last Year: Not on file  . Ran Out of Food in the Last Year: Not on file  Transportation Needs:   . Lack  of Transportation (Medical): Not on file  . Lack of Transportation (Non-Medical): Not on file  Physical Activity:   . Days of Exercise per Week: Not on file  . Minutes of Exercise per Session: Not on file  Stress:   . Feeling of Stress : Not on file  Social Connections:   . Frequency of Communication with Friends and Family: Not on file  . Frequency of Social Gatherings with Friends and Family: Not on file  . Attends Religious Services: Not on file  . Active Member of Clubs or Organizations: Not on file  . Attends Archivist Meetings: Not on file  . Marital Status: Not on file  Intimate Partner Violence:   . Fear of Current or Ex-Partner: Not on file  . Emotionally Abused: Not on file  . Physically Abused: Not on file  . Sexually Abused: Not on file   Current Meds  Medication Sig  . ALPRAZolam (XANAX) 0.25 MG tablet Take 1 tablet (0.25 mg total) by mouth daily as needed for anxiety.  . Ascorbic Acid (VITAMIN C) 1000 MG tablet Take 1,000 mg by mouth daily.  . Biotin 10000 MCG TABS   . Black Pepper-Turmeric (TURMERIC CURCUMIN) 09-998 MG CAPS   . celecoxib (CELEBREX) 200 MG capsule Take 1 capsule (200 mg total) by mouth daily.  . Cholecalciferol (VITAMIN D) 125 MCG (5000 UT) CAPS   . cyclobenzaprine (FLEXERIL) 10 MG tablet TAKE 1 TABLET (10 MG TOTAL) BY MOUTH AT BEDTIME prn  . Multiple Vitamins-Minerals (WOMENS MULTIVITAMIN PLUS PO) Take by mouth once.  . Omega-3 Fatty Acids (FISH OIL) 1000 MG CAPS Take by mouth.  Marland Kitchen omeprazole (PRILOSEC) 20 MG capsule Take 1 capsule (20 mg total) by mouth daily. 30 minutes before food  . pyridOXINE (VITAMIN B-6) 50 MG tablet Take 50 mg by mouth daily. ? Dose  . venlafaxine XR (EFFEXOR XR) 37.5 MG 24 hr capsule Take 1 capsule (37.5 mg total) by mouth daily with breakfast.  . [DISCONTINUED] ALPRAZolam (XANAX) 0.25 MG tablet Take 1 tablet (0.25 mg total) by mouth daily as needed for anxiety.  . [DISCONTINUED] venlafaxine XR (EFFEXOR XR) 37.5 MG  24 hr capsule Take 1 capsule (37.5 mg total) by mouth daily with breakfast.   No Known Allergies No results found for this or any previous visit (from the past 2160 hour(s)). Objective  Body mass index  is 25.63 kg/m. Wt Readings from Last 3 Encounters:  01/14/20 147 lb 6.4 oz (66.9 kg)  01/06/19 190 lb 3.2 oz (86.3 kg)  03/05/18 194 lb (88 kg)   Temp Readings from Last 3 Encounters:  01/14/20 98.4 F (36.9 C) (Oral)  01/06/19 98.2 F (36.8 C) (Oral)  10/28/18 98.4 F (36.9 C)   BP Readings from Last 3 Encounters:  01/14/20 122/80  01/06/19 (!) 152/86  03/05/18 136/78   Pulse Readings from Last 3 Encounters:  01/14/20 81  01/06/19 73  03/05/18 (!) 58    Physical Exam Vitals and nursing note reviewed.  Constitutional:      Appearance: Normal appearance. She is well-developed, well-groomed and overweight.  HENT:     Head: Normocephalic and atraumatic.  Eyes:     Conjunctiva/sclera: Conjunctivae normal.     Pupils: Pupils are equal, round, and reactive to light.  Cardiovascular:     Rate and Rhythm: Normal rate and regular rhythm.     Heart sounds: Normal heart sounds. No murmur heard.   Pulmonary:     Effort: Pulmonary effort is normal.     Breath sounds: Normal breath sounds.  Skin:    General: Skin is warm and dry.  Neurological:     General: No focal deficit present.     Mental Status: She is alert and oriented to person, place, and time. Mental status is at baseline.     Gait: Gait normal.  Psychiatric:        Attention and Perception: Attention and perception normal.        Mood and Affect: Mood and affect normal.        Speech: Speech normal.        Behavior: Behavior normal. Behavior is cooperative.        Thought Content: Thought content normal.        Cognition and Memory: Cognition and memory normal.        Judgment: Judgment normal.     Assessment  Plan  Annual physical exam - Plan: Comprehensive metabolic panel, Lipid panel, CBC with  Differential/Platelet  Flu shot given today  Tdap utd  rec hep B  mmr immune  covid 2/2  Consider shingrix in future   Pap 12/18/17 negative neg HPV  --> will refer 12/2020 to Dr. Sumner Boast in [place] when pt ready  Breast exam 01/06/19 with 3 oclock right breast pain -mammo sch 01/20/20  pseudostromal angiomatous hyperplasia and calcifications f/u in 1 yearordered -right dx mammo 09/14/18 negative but pt still having breast pain discussed breast surgery if continues vs breast MRI will try to reorder b/l diag mammo with [place]   Colonoscopy2/5/15 Dr. Ritta Slot repeat in 10 years scanned under media  Dermatology Dr. Kellie Moor saw 03/2017 due to f/u 11/2019saw and f/u due 11/2020f/u sch 03/2020   DEXA age 89 y.o  Cont healthy diet and exercise and goal wt 130s  Menopause - Plan: FSH Irritable declines psych/therapy for now  Eczema, unspecified type - Plan: triamcinolone cream (KENALOG) 0.1 %  Need for shingles vaccine - Plan: Zoster Vaccine Adjuvanted [facility]) injection, Zoster Vaccine Adjuvanted [facility]) injection  Anxiety and depression - Plan: venlafaxine XR (EFFEXOR XR) 37.5 MG 24 hr capsule, ALPRAZolam (XANAX) 0.25 MG tablet Declines psych/therapy for now Declines increase dose  Hot flashes - Plan: venlafaxine XR (EFFEXOR XR) 37.5 MG 24 hr capsule   Provider: Dr. Olivia Mackie McLean-Scocuzza-[facility]

## 2020-01-14 NOTE — Progress Notes (Signed)
imm

## 2020-01-14 NOTE — Patient Instructions (Addendum)
Panoxyl 4-8% wash let sit x 5 minutes (bleach) used for acne  Nizoral shampoo for fungus let sit x 5 minutes in shower   Triamcinolone cream if above does not work   Shingrix vaccine x 2 doses    Monitor Blood pressure at home goal <130/<80  Menopause Menopause is the normal time of life when menstrual periods stop completely. It is usually confirmed by 12 months without a menstrual period. The transition to menopause (perimenopause) most often happens between the ages of 63 and 57. During perimenopause, hormone levels change in your body, which can cause symptoms and affect your health. Menopause may increase your risk for:  Loss of bone (osteoporosis), which causes bone breaks (fractures).  Depression.  Hardening and narrowing of the arteries (atherosclerosis), which can cause heart attacks and strokes. What are the causes? This condition is usually caused by a natural change in hormone levels that happens as you get older. The condition may also be caused by surgery to remove both ovaries (bilateral oophorectomy). What increases the risk? This condition is more likely to start at an earlier age if you have certain medical conditions or treatments, including:  A tumor of the pituitary gland in the brain.  A disease that affects the ovaries and hormone production.  Radiation treatment for cancer.  Certain cancer treatments, such as chemotherapy or hormone (anti-estrogen) therapy.  Heavy smoking and excessive alcohol use.  Family history of early menopause. This condition is also more likely to develop earlier in women who are very thin. What are the signs or symptoms? Symptoms of this condition include:  Hot flashes.  Irregular menstrual periods.  Night sweats.  Changes in feelings about sex. This could be a decrease in sex drive or an increased comfort around your sexuality.  Vaginal dryness and thinning of the vaginal walls. This may cause painful  intercourse.  Dryness of the skin and development of wrinkles.  Headaches.  Problems sleeping (insomnia).  Mood swings or irritability.  Memory problems.  Weight gain.  Hair growth on the face and chest.  Bladder infections or problems with urinating. How is this diagnosed? This condition is diagnosed based on your medical history, a physical exam, your age, your menstrual history, and your symptoms. Hormone tests may also be done. How is this treated? In some cases, no treatment is needed. You and your health care provider should make a decision together about whether treatment is necessary. Treatment will be based on your individual condition and preferences. Treatment for this condition focuses on managing symptoms. Treatment may include:  Menopausal hormone therapy (MHT).  Medicines to treat specific symptoms or complications.  Acupuncture.  Vitamin or herbal supplements. Before starting treatment, make sure to let your health care provider know if you have a personal or family history of:  Heart disease.  Breast cancer.  Blood clots.  Diabetes.  Osteoporosis. Follow these instructions at home: Lifestyle  Do not use any products that contain nicotine or tobacco, such as cigarettes and e-cigarettes. If you need help quitting, ask your health care provider.  Get at least 30 minutes of physical activity on 5 or more days each week.  Avoid alcoholic and caffeinated beverages, as well as spicy foods. This may help prevent hot flashes.  Get 7-8 hours of sleep each night.  If you have hot flashes, try: ? Dressing in layers. ? Avoiding things that may trigger hot flashes, such as spicy food, warm places, or stress. ? Taking slow, deep breaths when a  hot flash starts. ? Keeping a fan in your home and office.  Find ways to manage stress, such as deep breathing, meditation, or journaling.  Consider going to group therapy with other women who are having menopause  symptoms. Ask your health care provider about recommended group therapy meetings. Eating and drinking  Eat a healthy, balanced diet that contains whole grains, lean protein, low-fat dairy, and plenty of fruits and vegetables.  Your health care provider may recommend adding more soy to your diet. Foods that contain soy include tofu, tempeh, and soy milk.  Eat plenty of foods that contain calcium and vitamin D for bone health. Items that are rich in calcium include low-fat milk, yogurt, beans, almonds, sardines, broccoli, and kale. Medicines  Take over-the-counter and prescription medicines only as told by your health care provider.  Talk with your health care provider before starting any herbal supplements. If prescribed, take vitamins and supplements as told by your health care provider. These may include: ? Calcium. Women age 67 and older should get 1,200 mg (milligrams) of calcium every day. ? Vitamin D. Women need 600-800 International Units of vitamin D each day. ? Vitamins B12 and B6. Aim for 50 micrograms of B12 and 1.5 mg of B6 each day. General instructions  Keep track of your menstrual periods, including: ? When they occur. ? How heavy they are and how long they last. ? How much time passes between periods.  Keep track of your symptoms, noting when they start, how often you have them, and how long they last.  Use vaginal lubricants or moisturizers to help with vaginal dryness and improve comfort during sex.  Keep all follow-up visits as told by your health care provider. This is important. This includes any group therapy or counseling. Contact a health care provider if:  You are still having menstrual periods after age 90.  You have pain during sex.  You have not had a period for 12 months and you develop vaginal bleeding. Get help right away if:  You have: ? Severe depression. ? Excessive vaginal bleeding. ? Pain when you urinate. ? A fast or irregular heart beat  (palpitations). ? Severe headaches. ? Abdomen (abdominal) pain or severe indigestion.  You fell and you think you have a broken bone.  You develop leg or chest pain.  You develop vision problems.  You feel a lump in your breast. Summary  Menopause is the normal time of life when menstrual periods stop completely. It is usually confirmed by 12 months without a menstrual period.  The transition to menopause (perimenopause) most often happens between the ages of 20 and 53.  Symptoms can be managed through medicines, lifestyle changes, and complementary therapies such as acupuncture.  Eat a balanced diet that is rich in nutrients to promote bone health and heart health and to manage symptoms during menopause. This information is not intended to replace advice given to you by your health care provider. Make sure you discuss any questions you have with your health care provider. Document Revised: 04/04/2017 Document Reviewed: 05/25/2016 Elsevier Patient Education  2020 Elsevier Inc.   Zoster Vaccine, Recombinant injection What is this medicine? ZOSTER VACCINE (ZOS ter vak SEEN) is used to prevent shingles in adults 58 years old and over. This vaccine is not used to treat shingles or nerve pain from shingles. This medicine may be used for other purposes; ask your health care provider or pharmacist if you have questions. COMMON BRAND NAME(S): [facility] What should I  tell my health care provider before I take this medicine? They need to know if you have any of these conditions:  blood disorders or disease  cancer like leukemia or lymphoma  immune system problems or therapy  an unusual or allergic reaction to vaccines, other medications, foods, dyes, or preservatives  pregnant or trying to get pregnant  breast-feeding How should I use this medicine? This vaccine is for injection in a muscle. It is given by a health care professional. Talk to your pediatrician regarding the use of  this medicine in children. This medicine is not approved for use in children. Overdosage: If you think you have taken too much of this medicine contact a poison control center or emergency room at once. NOTE: This medicine is only for you. Do not share this medicine with others. What if I miss a dose? Keep appointments for follow-up (booster) doses as directed. It is important not to miss your dose. Call your doctor or health care professional if you are unable to keep an appointment. What may interact with this medicine?  medicines that suppress your immune system  medicines to treat cancer  steroid medicines like prednisone or cortisone This list may not describe all possible interactions. Give your health care provider a list of all the medicines, herbs, non-prescription drugs, or dietary supplements you use. Also tell them if you smoke, drink alcohol, or use illegal drugs. Some items may interact with your medicine. What should I watch for while using this medicine? Visit your doctor for regular check ups. This vaccine, like all vaccines, may not fully protect everyone. What side effects may I notice from receiving this medicine? Side effects that you should report to your doctor or health care professional as soon as possible:  allergic reactions like skin rash, itching or hives, swelling of the face, lips, or tongue  breathing problems Side effects that usually do not require medical attention (report these to your doctor or health care professional if they continue or are bothersome):  chills  headache  fever  nausea, vomiting  redness, warmth, pain, swelling or itching at site where injected  tiredness This list may not describe all possible side effects. Call your doctor for medical advice about side effects. You may report side effects to FDA at 1-800-FDA-1088. Where should I keep my medicine? This vaccine is only given in a clinic, pharmacy, doctor's office, or other  health care setting and will not be stored at home. NOTE: This sheet is a summary. It may not cover all possible information. If you have questions about this medicine, talk to your doctor, pharmacist, or health care provider.  2020 Elsevier/Gold Standard (2016-12-02 13:20:30)

## 2020-01-15 LAB — URINALYSIS, ROUTINE W REFLEX MICROSCOPIC
Bacteria, UA: NONE SEEN /HPF
Bilirubin Urine: NEGATIVE
Glucose, UA: NEGATIVE
Hgb urine dipstick: NEGATIVE
Hyaline Cast: NONE SEEN /LPF
Ketones, ur: NEGATIVE
Nitrite: NEGATIVE
Protein, ur: NEGATIVE
Specific Gravity, Urine: 1.016 (ref 1.001–1.03)
pH: 6.5 (ref 5.0–8.0)

## 2020-01-17 NOTE — Addendum Note (Signed)
Addended by: Quentin Ore on: 01/17/2020 01:32 AM   Modules accepted: Orders

## 2020-01-20 ENCOUNTER — Other Ambulatory Visit: Payer: Self-pay

## 2020-01-20 ENCOUNTER — Ambulatory Visit
Admission: RE | Admit: 2020-01-20 | Discharge: 2020-01-20 | Disposition: A | Discharge status: Home / Self Care | Payer: BC Managed Care – PPO | Source: Ambulatory Visit | Attending: Internal Medicine | Admitting: Internal Medicine

## 2020-01-20 DIAGNOSIS — Z1231 Encounter for screening mammogram for malignant neoplasm of breast: Secondary | ICD-10-CM

## 2020-02-07 ENCOUNTER — Other Ambulatory Visit: Payer: Self-pay

## 2020-02-07 ENCOUNTER — Other Ambulatory Visit (HOSPITAL_COMMUNITY)
Admission: RE | Admit: 2020-02-07 | Discharge: 2020-02-07 | Disposition: A | Discharge status: Home / Self Care | Payer: BC Managed Care – PPO | Source: Ambulatory Visit | Attending: Obstetrics and Gynecology | Admitting: Obstetrics and Gynecology

## 2020-02-07 ENCOUNTER — Ambulatory Visit (INDEPENDENT_AMBULATORY_CARE_PROVIDER_SITE_OTHER): Payer: BC Managed Care – PPO | Admitting: Obstetrics and Gynecology

## 2020-02-07 ENCOUNTER — Encounter: Payer: Self-pay | Admitting: Obstetrics and Gynecology

## 2020-02-07 VITALS — BP 134/72 | HR 64 | Ht 63.75 in | Wt 152.0 lb

## 2020-02-07 DIAGNOSIS — Z124 Encounter for screening for malignant neoplasm of cervix: Secondary | ICD-10-CM | POA: Diagnosis not present

## 2020-02-07 DIAGNOSIS — Z01419 Encounter for gynecological examination (general) (routine) without abnormal findings: Secondary | ICD-10-CM | POA: Diagnosis not present

## 2020-02-07 DIAGNOSIS — Z8619 Personal history of other infectious and parasitic diseases: Secondary | ICD-10-CM

## 2020-02-07 DIAGNOSIS — R6882 Decreased libido: Secondary | ICD-10-CM

## 2020-02-07 DIAGNOSIS — E559 Vitamin D deficiency, unspecified: Secondary | ICD-10-CM

## 2020-02-07 NOTE — Progress Notes (Addendum)
59 y.o. [id number] Married White or Caucasian Not Hispanic or Latino female here for annual exam.  She is just wanting to establish care. She says no sexual desire. Not sexually active in the last year. Previously able to enjoy sex, was able to orgasm (but it was getting harder). Married x 7 years, initially very active. The marriage is great.  She has been on Effexor for a couple of years. Previously was on an SSRI. She is on a low dose and considering going off.  She does feel more easily agitated with things that aren't that important.     Patient's last menstrual period was 06/08/2016 (exact date).       Recently had a 40 lb weight loss recently.      Sexually active: No.  The current method of family planning is post menopausal status.    Exercising: Yes.    Walking  Smoker:  no  Health Maintenance: Pap:  12/18/17 WNL with Neg HR HPV; 10/28/16 WNL, +HPV History of abnormal Pap:  Yes HPV Had Colpo  MMG:  9/17/21density C Bi-rads 2 benign  BMD:   None  Colonoscopy: 2014 normal  TDaP:  12/03/17 Gardasil: no   reports that she has never smoked. She has never used smokeless tobacco. She reports current alcohol use. She reports that she does not use drugs. Retired Runner, broadcasting/film/video. Husband working, gone a couple of nights a week. Son is local, married, no kids. Stepdaughter has a Development worker, international aid.   Past Medical History:  Diagnosis Date  . Anxiety   . Arthritis    hips, low back  . Breast mass, right   . Chicken pox   . Depression   . GERD (gastroesophageal reflux disease)   . Plantar fasciitis   . PONV (postoperative nausea and vomiting)   . Vertigo     Past Surgical History:  Procedure Laterality Date  . BREAST BIOPSY Right 10/17/2015   stereo/atypical ductal hyperplasia  . BREAST BIOPSY Left 11/24/2017   x marker, PSEUDO-ANGIOMATOUS STROMAL HYPERPLASIA  . BREAST EXCISIONAL BIOPSY Right 2017   atypical ductal hyperplasia excisied  . BREAST SURGERY Bilateral 2010   Reduction   . CARPAL TUNNEL  RELEASE     Unsure of date and which laterality   . CERVICAL SPINE SURGERY  2012   Discectomy and fusion of C4,5, and 6 Dr. Gretta Began   . LASIK Bilateral 2001  . RADIOACTIVE SEED GUIDED EXCISIONAL BREAST BIOPSY Right 12/04/2015   Procedure: RADIOACTIVE SEED GUIDED EXCISIONAL BREAST BIOPSY;  Surgeon: Emelia Loron, MD;  Location: [facility];  Service: General;  Laterality: Right;  . REDUCTION MAMMAPLASTY Bilateral 2010  . SHOULDER SURGERY Left 1982   bone spur removed/ bone separation repair  . thumb joint replacement     L thumb Dr. Amanda Pea   . TONSILLECTOMY AND ADENOIDECTOMY  1967    Current Outpatient Medications  Medication Sig Dispense Refill  . ALPRAZolam (XANAX) 0.25 MG tablet Take 1 tablet (0.25 mg total) by mouth daily as needed for anxiety. 30 tablet 2  . Ascorbic Acid (VITAMIN C) 1000 MG tablet Take 1,000 mg by mouth daily.    . Biotin 41660 MCG TABS     . Black Pepper-Turmeric (TURMERIC CURCUMIN) 09-998 MG CAPS     . Calcium-Magnesium 70-83 MG CAPS Take by mouth in the morning and at bedtime. Calm supplement    . celecoxib (CELEBREX) 200 MG capsule Take 1 capsule (200 mg total) by mouth daily. 90 capsule 3  . Cholecalciferol (  VITAMIN D) 125 MCG (5000 UT) CAPS     . cyclobenzaprine (FLEXERIL) 10 MG tablet TAKE 1 TABLET (10 MG TOTAL) BY MOUTH AT BEDTIME prn 30 tablet 2  . Multiple Vitamins-Minerals (WOMENS MULTIVITAMIN PLUS PO) Take by mouth once.    . Omega-3 Fatty Acids (FISH OIL) 1000 MG CAPS Take by mouth.    Marland Kitchen omeprazole (PRILOSEC) 20 MG capsule Take 1 capsule (20 mg total) by mouth daily. 30 minutes before food 90 capsule 3  . pyridOXINE (VITAMIN B-6) 100 MG tablet Take 100 mg by mouth daily.     Marland Kitchen triamcinolone cream (KENALOG) 0.1 % Apply 1 application topically 2 (two) times daily. Prn back for itching 30 g 0  . venlafaxine XR (EFFEXOR XR) 37.5 MG 24 hr capsule Take 1 capsule (37.5 mg total) by mouth daily with breakfast. 90 capsule 3   No current  facility-administered medications for this visit.    Family History  Problem Relation Age of Onset  . Arthritis Mother   . Hypertension Mother   . Kidney disease Mother   . Alcohol abuse Father   . Arthritis Father   . Lung cancer Father   . Hypertension Father   . Cancer Father        lung smoker   . Alcohol abuse Brother   . Hypertension Brother   . Arthritis Maternal Aunt   . Cancer Maternal Aunt        melanoma  . Arthritis Maternal Uncle   . Lung cancer Maternal Uncle   . Cancer Maternal Uncle        bladder not smoker   . Colon cancer Paternal Uncle   . Cancer Paternal Uncle        colon cancer older age 75s died in 1s  . Lung cancer Maternal Grandmother   . Hypertension Maternal Grandmother   . Cancer Maternal Grandmother        lung not smoker   . Rectal cancer Cousin        50 had rectal cancer   . Breast cancer Cousin 86  1st Cousin x 2 on her father's side with breast cancer and one on her mother's side. No Aunts or Uncles with breast cancer.   Review of Systems  All other systems reviewed and are negative.   Exam:   BP 134/72   Pulse 64   Ht 5' 3.75" (1.619 m)   Wt 152 lb (68.9 kg)   LMP 06/08/2016 (Exact Date) Comment: spotted in May for 2 days  SpO2 100%   BMI 26.30 kg/m   Weight change: @WEIGHTCHANGE @ Height:   Height: 5' 3.75" (161.9 cm)  Ht Readings from Last 3 Encounters:  02/07/20 5' 3.75" (1.619 m)  01/14/20 5' 3.58" (1.615 m)  01/06/19 5\' 5"  (1.651 m)    General appearance: alert, cooperative and appears stated age Head: Normocephalic, without obvious abnormality, atraumatic Neck: no adenopathy, supple, symmetrical, trachea midline and thyroid normal to inspection and palpation Lungs: clear to auscultation bilaterally Cardiovascular: regular rate and rhythm Breasts: normal appearance, no masses or tenderness, evidence of bilateral breast reduction. Abdomen: soft, non-tender; non distended,  no masses,  no organomegaly Extremities:  extremities normal, atraumatic, no cyanosis or edema Skin: Skin color, texture, turgor normal. No rashes or lesions Lymph nodes: Cervical, supraclavicular, and axillary nodes normal. No abnormal inguinal nodes palpated Neurologic: Grossly normal   Pelvic: External genitalia:  no lesions  Urethra:  normal appearing urethra with no masses, tenderness or lesions              Bartholins and Skenes: normal                 Vagina: normal appearing vagina with normal color and discharge, no lesions              Cervix: no lesions and stenotic os               Bimanual Exam:  Uterus:  normal size, contour, position, consistency, mobility, non-tender              Adnexa: no mass, fullness, tenderness               Rectovaginal: Confirms               Anus:  normal sphincter tone, no lesions  Zenovia Jordan chaperoned for the exam.  A:  Well Woman with normal exam  Absent libido, discussed multifactorial etiology.   Vit d def in diet  H/O HPV  P:   Discussed breast self exam  Discussed calcium and vit D intake  Mammogram just done  Screening labs with primary  Testosterone levels and vit d level  Discussed possible use of compounded testosterone cream. Reviewed that it isn't FDA approved, discussed possible side effects and need for monitoring testosterone levels. She would like to try this if her levels are low.   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  Normal colonoscopy in 2015, f/u in 10 years.   Pap from 02/07/20 negative, negative hpv. She needs a f/u pap in 3 years.

## 2020-02-07 NOTE — Patient Instructions (Signed)
EXERCISE AND DIET:  We recommended that you start or continue a regular exercise program for good health. Regular exercise means any activity that makes your heart beat faster and makes you sweat.  We recommend exercising at least 30 minutes per day at least 3 days a week, preferably 4 or 5.  We also recommend a diet low in fat and sugar.  Inactivity, poor dietary choices and obesity can cause diabetes, heart attack, stroke, and kidney damage, among others.    ALCOHOL AND SMOKING:  Women should limit their alcohol intake to no more than 7 drinks/beers/glasses of wine (combined, not each!) per week. Moderation of alcohol intake to this level decreases your risk of breast cancer and liver damage. And of course, no recreational drugs are part of a healthy lifestyle.  And absolutely no smoking or even second hand smoke. Most people know smoking can cause heart and lung diseases, but did you know it also contributes to weakening of your bones? Aging of your skin?  Yellowing of your teeth and nails?  CALCIUM AND VITAMIN D:  Adequate intake of calcium and Vitamin D are recommended.  The recommendations for exact amounts of these supplements seem to change often, but generally speaking 1,000 mg of calcium (between diet and supplement) and 800 units of Vitamin D per day seems prudent. Certain women may benefit from higher intake of Vitamin D.  If you are among these women, your doctor will have told you during your visit.    PAP SMEARS:  Pap smears, to check for cervical cancer or precancers,  have traditionally been done yearly, although recent scientific advances have shown that most women can have pap smears less often.  However, every woman still should have a physical exam from her gynecologist every year. It will include a breast check, inspection of the vulva and vagina to check for abnormal growths or skin changes, a visual exam of the cervix, and then an exam to evaluate the size and shape of the uterus and  ovaries.  And after 58 years of age, a rectal exam is indicated to check for rectal cancers. We will also provide age appropriate advice regarding health maintenance, like when you should have certain vaccines, screening for sexually transmitted diseases, bone density testing, colonoscopy, mammograms, etc.   MAMMOGRAMS:  All women over 40 years old should have a yearly mammogram. Many facilities now offer a "3D" mammogram, which may cost around $50 extra out of pocket. If possible,  we recommend you accept the option to have the 3D mammogram performed.  It both reduces the number of women who will be called back for extra views which then turn out to be normal, and it is better than the routine mammogram at detecting truly abnormal areas.    COLON CANCER SCREENING: Now recommend starting at age 45. At this time colonoscopy is not covered for routine screening until 50. There are take home tests that can be done between 45-49.   COLONOSCOPY:  Colonoscopy to screen for colon cancer is recommended for all women at age 50.  We know, you hate the idea of the prep.  We agree, BUT, having colon cancer and not knowing it is worse!!  Colon cancer so often starts as a polyp that can be seen and removed at colonscopy, which can quite literally save your life!  And if your first colonoscopy is normal and you have no family history of colon cancer, most women don't have to have it again for   10 years.  Once every ten years, you can do something that may end up saving your life, right?  We will be happy to help you get it scheduled when you are ready.  Be sure to check your insurance coverage so you understand how much it will cost.  It may be covered as a preventative service at no cost, but you should check your particular policy.      Breast Self-Awareness Breast self-awareness means being familiar with how your breasts look and feel. It involves checking your breasts regularly and reporting any changes to your  health care provider. Practicing breast self-awareness is important. A change in your breasts can be a sign of a serious medical problem. Being familiar with how your breasts look and feel allows you to find any problems early, when treatment is more likely to be successful. All women should practice breast self-awareness, including women who have had breast implants. How to do a breast self-exam One way to learn what is normal for your breasts and whether your breasts are changing is to do a breast self-exam. To do a breast self-exam: Look for Changes  1. Remove all the clothing above your waist. 2. Stand in front of a mirror in a room with good lighting. 3. Put your hands on your hips. 4. Push your hands firmly downward. 5. Compare your breasts in the mirror. Look for differences between them (asymmetry), such as: ? Differences in shape. ? Differences in size. ? Puckers, dips, and bumps in one breast and not the other. 6. Look at each breast for changes in your skin, such as: ? Redness. ? Scaly areas. 7. Look for changes in your nipples, such as: ? Discharge. ? Bleeding. ? Dimpling. ? Redness. ? A change in position. Feel for Changes Carefully feel your breasts for lumps and changes. It is best to do this while lying on your back on the floor and again while sitting or standing in the shower or tub with soapy water on your skin. Feel each breast in the following way:  Place the arm on the side of the breast you are examining above your head.  Feel your breast with the other hand.  Start in the nipple area and make  inch (2 cm) overlapping circles to feel your breast. Use the pads of your three middle fingers to do this. Apply light pressure, then medium pressure, then firm pressure. The light pressure will allow you to feel the tissue closest to the skin. The medium pressure will allow you to feel the tissue that is a little deeper. The firm pressure will allow you to feel the tissue  close to the ribs.  Continue the overlapping circles, moving downward over the breast until you feel your ribs below your breast.  Move one finger-width toward the center of the body. Continue to use the  inch (2 cm) overlapping circles to feel your breast as you move slowly up toward your collarbone.  Continue the up and down exam using all three pressures until you reach your armpit.  Write Down What You Find  Write down what is normal for each breast and any changes that you find. Keep a written record with breast changes or normal findings for each breast. By writing this information down, you do not need to depend only on memory for size, tenderness, or location. Write down where you are in your menstrual cycle, if you are still menstruating. If you are having trouble noticing differences   in your breasts, do not get discouraged. With time you will become more familiar with the variations in your breasts and more comfortable with the exam. How often should I examine my breasts? Examine your breasts every month. If you are breastfeeding, the best time to examine your breasts is after a feeding or after using a breast pump. If you menstruate, the best time to examine your breasts is 5-7 days after your period is over. During your period, your breasts are lumpier, and it may be more difficult to notice changes. When should I see my health care provider? See your health care provider if you notice:  A change in shape or size of your breasts or nipples.  A change in the skin of your breast or nipples, such as a reddened or scaly area.  Unusual discharge from your nipples.  A lump or thick area that was not there before.  Pain in your breasts.  Anything that concerns you.  

## 2020-02-09 LAB — CYTOLOGY - PAP
Comment: NEGATIVE
Diagnosis: NEGATIVE
High risk HPV: NEGATIVE

## 2020-02-11 ENCOUNTER — Telehealth: Payer: Self-pay | Admitting: *Deleted

## 2020-02-11 NOTE — Telephone Encounter (Signed)
Please place future orders for lab appt.  

## 2020-02-12 LAB — VITAMIN D 25 HYDROXY (VIT D DEFICIENCY, FRACTURES): Vit D, 25-Hydroxy: 85.3 ng/mL (ref 30.0–100.0)

## 2020-02-12 LAB — TESTT+TESTF+SHBG
Sex Hormone Binding: 58.6 nmol/L (ref 17.3–125.0)
Testosterone, Free: 2.4 pg/mL (ref 0.0–4.2)
Testosterone, Total, LC/MS: 14.9 ng/dL

## 2020-02-14 ENCOUNTER — Other Ambulatory Visit: Payer: Self-pay | Admitting: Internal Medicine

## 2020-02-14 DIAGNOSIS — D72819 Decreased white blood cell count, unspecified: Secondary | ICD-10-CM

## 2020-02-14 NOTE — Telephone Encounter (Signed)
Orders in 10/12 thanks

## 2020-02-15 ENCOUNTER — Other Ambulatory Visit: Payer: Self-pay

## 2020-02-15 ENCOUNTER — Other Ambulatory Visit: Payer: Self-pay | Admitting: *Deleted

## 2020-02-15 ENCOUNTER — Other Ambulatory Visit (INDEPENDENT_AMBULATORY_CARE_PROVIDER_SITE_OTHER): Payer: BC Managed Care – PPO

## 2020-02-15 DIAGNOSIS — D72819 Decreased white blood cell count, unspecified: Secondary | ICD-10-CM | POA: Diagnosis not present

## 2020-02-15 DIAGNOSIS — R6882 Decreased libido: Secondary | ICD-10-CM

## 2020-02-15 LAB — CBC WITH DIFFERENTIAL/PLATELET
Basophils Absolute: 0.1 10*3/uL (ref 0.0–0.1)
Basophils Relative: 1.2 % (ref 0.0–3.0)
Eosinophils Absolute: 0.1 10*3/uL (ref 0.0–0.7)
Eosinophils Relative: 2.8 % (ref 0.0–5.0)
HCT: 38.6 % (ref 36.0–46.0)
Hemoglobin: 13 g/dL (ref 12.0–15.0)
Lymphocytes Relative: 56.3 % — ABNORMAL HIGH (ref 12.0–46.0)
Lymphs Abs: 2.3 10*3/uL (ref 0.7–4.0)
MCHC: 33.7 g/dL (ref 30.0–36.0)
MCV: 90.2 fl (ref 78.0–100.0)
Monocytes Absolute: 0.4 10*3/uL (ref 0.1–1.0)
Monocytes Relative: 9.5 % (ref 3.0–12.0)
Neutro Abs: 1.3 10*3/uL — ABNORMAL LOW (ref 1.4–7.7)
Neutrophils Relative %: 30.6 % — ABNORMAL LOW (ref 43.0–77.0)
Platelets: 211 10*3/uL (ref 150.0–400.0)
RBC: 4.28 Mil/uL (ref 3.87–5.11)
RDW: 13.5 % (ref 11.5–15.5)
WBC: 4.1 10*3/uL (ref 4.0–10.5)

## 2020-02-15 MED ORDER — NONFORMULARY OR COMPOUNDED ITEM: 1 refills | Status: DC

## 2020-02-16 ENCOUNTER — Encounter: Payer: Self-pay | Admitting: Internal Medicine

## 2020-02-17 NOTE — Telephone Encounter (Signed)
Creig Hines, MD  02/17/2020 8:14 AM EDT     Hello Dr. Mclean-Scocuzza, Lymphocytosis is relative and her absolute lymphocyte count is normal. She actually has neutropenia which makes her relative lymphocyte percentage appear higher. You can refer her to [place] for neutropenia. Hope this helps. Regards,Archana

## 2020-02-17 NOTE — Telephone Encounter (Signed)
-----   Message from Bevelyn Buckles, MD sent at 02/16/2020  5:09 PM EDT ----- [place] Asters blood cell ct normal but neutrophils low and lymphocytes high  I sent hematology a message weigh in on this  Reach back out in 1-2 weeks if you have not heard from [place]

## 2020-02-21 NOTE — Addendum Note (Signed)
Addended by: Quentin Ore on: 02/21/2020 09:23 AM   Modules accepted: Orders

## 2020-02-29 ENCOUNTER — Inpatient Hospital Stay: Payer: BC Managed Care – PPO

## 2020-02-29 ENCOUNTER — Inpatient Hospital Stay: Payer: BC Managed Care – PPO | Attending: Oncology | Admitting: Oncology

## 2020-02-29 ENCOUNTER — Other Ambulatory Visit: Payer: Self-pay

## 2020-02-29 ENCOUNTER — Encounter: Payer: Self-pay | Admitting: Oncology

## 2020-02-29 VITALS — BP 143/86 | HR 77 | Temp 97.1°F | Resp 18 | Wt 152.3 lb

## 2020-02-29 DIAGNOSIS — D708 Other neutropenia: Secondary | ICD-10-CM

## 2020-02-29 DIAGNOSIS — F329 Major depressive disorder, single episode, unspecified: Secondary | ICD-10-CM

## 2020-02-29 DIAGNOSIS — Z800 Family history of malignant neoplasm of digestive organs: Secondary | ICD-10-CM

## 2020-02-29 DIAGNOSIS — D709 Neutropenia, unspecified: Secondary | ICD-10-CM | POA: Diagnosis not present

## 2020-02-29 DIAGNOSIS — F419 Anxiety disorder, unspecified: Secondary | ICD-10-CM | POA: Insufficient documentation

## 2020-02-29 DIAGNOSIS — Z8052 Family history of malignant neoplasm of bladder: Secondary | ICD-10-CM

## 2020-02-29 DIAGNOSIS — E785 Hyperlipidemia, unspecified: Secondary | ICD-10-CM | POA: Diagnosis not present

## 2020-02-29 DIAGNOSIS — Z808 Family history of malignant neoplasm of other organs or systems: Secondary | ICD-10-CM | POA: Diagnosis not present

## 2020-02-29 DIAGNOSIS — Z801 Family history of malignant neoplasm of trachea, bronchus and lung: Secondary | ICD-10-CM | POA: Insufficient documentation

## 2020-02-29 LAB — TECHNOLOGIST SMEAR REVIEW: Plt Morphology: ADEQUATE

## 2020-02-29 LAB — CBC WITH DIFFERENTIAL/PLATELET
Abs Immature Granulocytes: 0.01 10*3/uL (ref 0.00–0.07)
Basophils Absolute: 0 10*3/uL (ref 0.0–0.1)
Basophils Relative: 1 %
Eosinophils Absolute: 0.1 10*3/uL (ref 0.0–0.5)
Eosinophils Relative: 1 %
HCT: 38.6 % (ref 36.0–46.0)
Hemoglobin: 13.2 g/dL (ref 12.0–15.0)
Immature Granulocytes: 0 %
Lymphocytes Relative: 44 %
Lymphs Abs: 2.3 10*3/uL (ref 0.7–4.0)
MCH: 30.3 pg (ref 26.0–34.0)
MCHC: 34.2 g/dL (ref 30.0–36.0)
MCV: 88.7 fL (ref 80.0–100.0)
Monocytes Absolute: 0.3 10*3/uL (ref 0.1–1.0)
Monocytes Relative: 6 %
Neutro Abs: 2.5 10*3/uL (ref 1.7–7.7)
Neutrophils Relative %: 48 %
Platelets: 226 10*3/uL (ref 150–400)
RBC: 4.35 MIL/uL (ref 3.87–5.11)
RDW: 13.2 % (ref 11.5–15.5)
WBC: 5.2 10*3/uL (ref 4.0–10.5)
nRBC: 0 % (ref 0.0–0.2)

## 2020-02-29 LAB — FOLATE: Folate: 59.4 ng/mL (ref >5.9)

## 2020-02-29 LAB — HEPATITIS C ANTIBODY: HCV Ab: NONREACTIVE

## 2020-02-29 LAB — HIV ANTIBODY (ROUTINE TESTING W REFLEX): HIV Screen 4th Generation wRfx: NONREACTIVE

## 2020-02-29 LAB — VITAMIN B12: Vitamin B-12: 321 pg/mL (ref 180–914)

## 2020-03-01 LAB — ANA COMPREHENSIVE PANEL

## 2020-03-03 ENCOUNTER — Encounter: Payer: Self-pay | Admitting: Oncology

## 2020-03-03 NOTE — Progress Notes (Signed)
Hematology/Oncology Consult note [facility] Telephone:([phone] [phone]  Patient Care Team: McLean-Scocuzza, Nino Glow, MD as PCP - General (Internal Medicine)   Name of the patient: Yvonne Moore  [id number]  30-Jun-1960    Reason for referral-neutropenia   Referring physician-Dr. Mclean-Scocuzza  Date of visit: 03/03/20   History of presenting illness- Patient is a 59 year old female with a past medical history significant for anxiety and depression, hyperlipidemia among other medical problems.  She has been referred to [place] for neutropenia.  Her most recent CBC from 02/15/2020 showed white count of 4.1, H&H of 13/38.6 and a platelet count of 211.  Differential showed neutropenia with an ANC of 1.3 and neutrophil percentage of 30% with relative lymphocytosis of 56%.  Prior to that her CBC on 01/14/2020 showed a white count of 3.6 with an [place] of 1.2 patient denies any recent changes to her medications.  Denies any recent viral illnesses or over-the-counter herbal supplements.  She feels well overall with a stable appetite and weight.  Denies any recurrent infections or hospitalizations  ECOG PS- 1  Pain scale- 0   Review of systems- Review of Systems  Constitutional: Negative for chills, fever, malaise/fatigue and weight loss.  HENT: Negative for congestion, ear discharge and nosebleeds.   Eyes: Negative for blurred vision.  Respiratory: Negative for cough, hemoptysis, sputum production, shortness of breath and wheezing.   Cardiovascular: Negative for chest pain, palpitations, orthopnea and claudication.  Gastrointestinal: Negative for abdominal pain, blood in stool, constipation, diarrhea, heartburn, melena, nausea and vomiting.  Genitourinary: Negative for dysuria, flank pain, frequency, hematuria and urgency.  Musculoskeletal: Negative for back pain, joint pain and myalgias.  Skin: Negative for rash.  Neurological: Negative for dizziness,  tingling, focal weakness, seizures, weakness and headaches.  Endo/Heme/Allergies: Does not bruise/bleed easily.  Psychiatric/Behavioral: Negative for depression and suicidal ideas. The patient does not have insomnia.     No Known Allergies  Patient Active Problem List   Diagnosis Date Noted  . Breast pain, right 01/07/2019  . Hematuria 01/07/2019  . Left hip pain 01/07/2019  . Acute pain of left knee 01/07/2019  . Hot flashes 03/05/2018  . Hyperlipidemia 03/05/2018  . Breast calcifications 03/05/2018  . Cervical high risk human papillomavirus (HPV) DNA test positive 12/18/2017  . Perimenopause 12/18/2017  . Muscle spasm 12/03/2017  . 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 Breast cancer screening, high risk patient 11/03/2015  . Paresthesia of right lower extremity 08/28/2015  . Encounter to establish care 08/13/2015  . Arthralgia of multiple joints 08/13/2015  . GERD (gastroesophageal reflux disease) 08/13/2015  . Anxiety and depression 08/13/2015  . Elevated BP 08/13/2015  . Tendinitis of thumb 08/13/2015     Past Medical History:  Diagnosis Date  . Anxiety   . Arthritis    hips, low back  . Breast mass, right   . Chicken pox   . Depression   . GERD (gastroesophageal reflux disease)   . Plantar fasciitis   . PONV (postoperative nausea and vomiting)   . Vertigo      Past Surgical History:  Procedure Laterality Date  . BREAST BIOPSY Right 10/17/2015   stereo/atypical ductal hyperplasia  . BREAST BIOPSY Left 11/24/2017   x marker, PSEUDO-ANGIOMATOUS STROMAL HYPERPLASIA  . BREAST EXCISIONAL BIOPSY Right 2017   atypical ductal hyperplasia excisied  . BREAST SURGERY Bilateral 2010   Reduction   . CARPAL TUNNEL RELEASE     Unsure of date and which laterality   .  CERVICAL SPINE SURGERY  2012   Discectomy and fusion of C4,5, and 6 Dr. Kendal Hymen   . LASIK Bilateral 2001  . RADIOACTIVE SEED GUIDED EXCISIONAL BREAST BIOPSY Right 12/04/2015   Procedure:  RADIOACTIVE SEED GUIDED EXCISIONAL BREAST BIOPSY;  Surgeon: Rolm Bookbinder, MD;  Location: [facility];  Service: General;  Laterality: Right;  . REDUCTION MAMMAPLASTY Bilateral 2010  . SHOULDER SURGERY Left 1982   bone spur removed/ bone separation repair  . thumb joint replacement     L thumb Dr. Amedeo Plenty   . TONSILLECTOMY AND ADENOIDECTOMY  1967    Social History   Socioeconomic History  . Marital status: Married    Spouse name: Not on file  . Number of children: Not on file  . Years of education: Not on file  . Highest education level: Not on file  Occupational History  . Not on file  Tobacco Use  . Smoking status: Never Smoker  . Smokeless tobacco: Never Used  . Tobacco comment: socially in college, 1 pk lasted 1 month  Vaping Use  . Vaping Use: Never used  Substance and Sexual Activity  . Alcohol use: Yes    Alcohol/week: 0.0 - 1.0 standard drinks    Comment: social  . Drug use: No  . Sexual activity: Yes    Birth control/protection: Post-menopausal  Other Topics Concern  . 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 Difficulty of Paying Living Expenses: Not on file  Food Insecurity:   . Worried About Charity fundraiser in the Last Year: Not on file  . Ran Out of Food in the Last Year: Not on file  Transportation Needs:   . Lack of Transportation (Medical): Not on file  . Lack of Transportation (Non-Medical): Not on file  Physical Activity:   . Days of Exercise per Week: Not on file  . Minutes of Exercise per Session: Not on file  Stress:   . Feeling of Stress : Not on file  Social Connections:   . Frequency of Communication with Friends and Family: Not on file  . Frequency of Social Gatherings with Friends and Family: Not on file  . Attends Religious Services: Not on  file  . Active Member of Clubs or Organizations: Not on file  . Attends Archivist Meetings: Not on file  . Marital Status: Not on file  Intimate Partner Violence:   . Fear of Current or Ex-Partner: Not on file  . Emotionally Abused: Not on file  . Physically Abused: Not on file  . Sexually Abused: Not on file     Family History  Problem Relation Age of Onset  . Arthritis Mother   . Hypertension Mother   . Kidney disease Mother   . Alcohol abuse Father   . Arthritis Father   . Lung cancer Father   . Hypertension Father   . Cancer Father        lung smoker   . Alcohol abuse Brother   . Hypertension Brother   . Arthritis Maternal Aunt   . Cancer Maternal Aunt        melanoma  . Arthritis Maternal Uncle   . Lung cancer Maternal Uncle   . Cancer Maternal Uncle  bladder not smoker   . Colon cancer Paternal Uncle   . Cancer Paternal Uncle        colon cancer older age 9s died in 38s  . Lung cancer Maternal Grandmother   . Hypertension Maternal Grandmother   . Cancer Maternal Grandmother        lung not smoker   . Rectal cancer Cousin        3 had rectal cancer   . Breast cancer Cousin 40     Current Outpatient Medications:  .  ALPRAZolam (XANAX) 0.25 MG tablet, Take 1 tablet (0.25 mg total) by mouth daily as needed for anxiety., Disp: 30 tablet, Rfl: 2 .  Ascorbic Acid (VITAMIN C) 1000 MG tablet, Take 1,000 mg by mouth daily., Disp: , Rfl:  .  Biotin 10000 MCG TABS, , Disp: , Rfl:  .  Black Pepper-Turmeric (TURMERIC CURCUMIN) 09-998 MG CAPS, , Disp: , Rfl:  .  Calcium-Magnesium 70-83 MG CAPS, Take by mouth in the morning and at bedtime. Calm supplement, Disp: , Rfl:  .  Cholecalciferol (VITAMIN D) 125 MCG (5000 UT) CAPS, , Disp: , Rfl:  .  cyclobenzaprine (FLEXERIL) 10 MG tablet, TAKE 1 TABLET (10 MG TOTAL) BY MOUTH AT BEDTIME prn, Disp: 30 tablet, Rfl: 2 .  Multiple Vitamins-Minerals (WOMENS MULTIVITAMIN PLUS PO), Take by mouth once., Disp: , Rfl:  .   NONFORMULARY OR COMPOUNDED ITEM, 1% testosterone cream, apply 0.5 grams to the lower abdomen daily.  Dispense 15 grams. One Refill., Disp: 1 each, Rfl: 1 .  Omega-3 Fatty Acids (FISH OIL) 1000 MG CAPS, Take by mouth., Disp: , Rfl:  .  omeprazole (PRILOSEC) 20 MG capsule, Take 1 capsule (20 mg total) by mouth daily. 30 minutes before food, Disp: 90 capsule, Rfl: 3 .  pyridOXINE (VITAMIN B-6) 100 MG tablet, Take 100 mg by mouth daily. , Disp: , Rfl:  .  triamcinolone cream (KENALOG) 0.1 %, Apply 1 application topically 2 (two) times daily. Prn back for itching, Disp: 30 g, Rfl: 0 .  venlafaxine XR (EFFEXOR XR) 37.5 MG 24 hr capsule, Take 1 capsule (37.5 mg total) by mouth daily with breakfast., Disp: 90 capsule, Rfl: 3 .  celecoxib (CELEBREX) 200 MG capsule, Take 1 capsule (200 mg total) by mouth daily., Disp: 90 capsule, Rfl: 3   Physical exam:  Vitals:   02/29/20 1342  BP: (!) 143/86  Pulse: 77  Resp: 18  Temp: (!) 97.1 F (36.2 C)  TempSrc: Tympanic  SpO2: 100%  Weight: 152 lb 4.8 oz (69.1 kg)   Physical Exam Cardiovascular:     Rate and Rhythm: Normal rate and regular rhythm.     Heart sounds: Normal heart sounds.  Pulmonary:     Effort: Pulmonary effort is normal.     Breath sounds: Normal breath sounds.  Abdominal:     General: Bowel sounds are normal.     Palpations: Abdomen is soft.     Comments: No palpable hepatosplenomegaly  Lymphadenopathy:     Comments: No palpable cervical, supraclavicular, axillary or inguinal adenopathy   Skin:    General: Skin is warm and dry.  Neurological:     Mental Status: She is alert and oriented to person, place, and time.        CMP Latest Ref Rng & Units 01/14/2020  Glucose 70 - 99 mg/dL 84  BUN 6 - 23 mg/dL 13  Creatinine 0.40 - 1.20 mg/dL 0.71  Sodium 135 - 145 mEq/L 139  Potassium  3.5 - 5.1 mEq/L 4.2  Chloride 96 - 112 mEq/L 101  CO2 19 - 32 mEq/L 33(H)  Calcium 8.4 - 10.5 mg/dL 9.6  Total Protein 6.0 - 8.3 g/dL 7.1    Total Bilirubin 0.2 - 1.2 mg/dL 0.4  Alkaline Phos 39 - 117 U/L 46  AST 0 - 37 U/L 21  ALT 0 - 35 U/L 22   CBC Latest Ref Rng & Units 02/29/2020  WBC 4.0 - 10.5 K/uL 5.2  Hemoglobin 12.0 - 15.0 g/dL 13.2  Hematocrit 36 - 46 % 38.6  Platelets 150 - 400 K/uL 226     Assessment and plan- Patient is a 59 y.o. female referred for neutropenia  Patient noted to have mild isolated neutropenia over the last 2 months with an ANC between 1.2-1.3.  Hemoglobin and platelets are normal.  Her counts prior to September 2021 did not reveal any evidence of neutropenia.  No recent viral infections or changes in medications or over-the-counter herbal supplements.  Given the absence of other cytopenias I am inclined to monitor this conservatively and if the neutrophil count keeps coming down consistently bone marrow biopsy can be considered.  She does not have any B symptoms or palpable adenopathy.  I will start with a CBC with differential, smear review, B12 folate, HIV and hepatitis C testing.  I will see her back in 2 weeks for a video visit   Thank you for this kind referral and the opportunity to participate in the care of this patient   Visit Diagnosis 1. Other neutropenia ([place])     Dr. Randa Evens, MD, MPH [facility] at [facility] [phone] 03/03/2020 1:11 PM

## 2020-03-07 LAB — COMP PANEL: LEUKEMIA/LYMPHOMA

## 2020-03-10 ENCOUNTER — Encounter: Payer: Self-pay | Admitting: Obstetrics and Gynecology

## 2020-03-14 ENCOUNTER — Inpatient Hospital Stay: Payer: BC Managed Care – PPO | Attending: Oncology | Admitting: Oncology

## 2020-03-14 DIAGNOSIS — D708 Other neutropenia: Secondary | ICD-10-CM | POA: Diagnosis not present

## 2020-03-15 ENCOUNTER — Telehealth: Payer: Self-pay

## 2020-03-15 NOTE — Telephone Encounter (Signed)
Per reviewed results from Dr Oscar La on 02/07/20:  Yvonne Bolk, MD  02/14/2020 11:45 AM EDT     Please let the patient know that her testosterone levels are low normal and call in 1% testosterone cream, apply 0.5 grams to the lower abdomen daily. 1 month supply with one refill. She needs a repeat testosterone panel in 1 month. Vit d is normal.    Spoke with pt. Pt states not using testosterone cream until 03/08/20. Pt wanting to know if needs to move lab appt for recheck.  Per reviewed results above, advised pt to move lab appt for 2 more weeks to get accurate lab results. Pt agreeable. Pt scheduled for lab appt on 12/6 at 1045 am. Pt verbalized understanding to date and time of appt.  Encounter closed

## 2020-03-15 NOTE — Telephone Encounter (Signed)
Pt sent the following mychart message:  Kelani, Robart [facility] Hi Dr. Oscar La,  Due to a referral to a hematologist from my PCP to investigate issues found in my annual physical bloodwork, I did not start the testosterone cream until Wed. of this week (11/3). I am wondering if I should reschedule my upcoming lab appt (11/16) to make it a month out from when I started using the cream or if I should keep it as is which would be just over 2 weeks of using the cream.? Your feedback is appreciated!  ~Daliya

## 2020-03-17 NOTE — Progress Notes (Signed)
I connected with Yvonne Moore on 03/17/20 at  2:45 PM EST by video enabled telemedicine visit and verified that I am speaking with the correct person using two identifiers.   I discussed the limitations, risks, security and privacy concerns of performing an evaluation and management service by telemedicine and the availability of in-person appointments. I also discussed with the patient that there may be a patient responsible charge related to this service. The patient expressed understanding and agreed to proceed.  Other persons participating in the visit and their role in the encounter:  none  Patient's location:  home Provider's location:  work  Risk analyst Complaint: Discuss results of blood work  History of present illness: Patient is a 59 year old female with a past medical history significant for anxiety and depression, hyperlipidemia among other medical problems.  She has been referred to [place] for neutropenia.  Her most recent CBC from 02/15/2020 showed white count of 4.1, H&H of 13/38.6 and a platelet count of 211.  Differential showed neutropenia with an ANC of 1.3 and neutrophil percentage of 30% with relative lymphocytosis of 56%.  Prior to that her CBC on 01/14/2020 showed a white count of 3.6 with an [place] of 1.2 patient denies any recent changes to her medications.  Denies any recent viral illnesses or over-the-counter herbal supplements.  She feels well overall with a stable appetite and weight.  Denies any recurrent infections or hospitalizations  Results of blood work from 02/29/2020 were as follows: CBC showed a white cell count of 5.2 with a normal differential and ANC of 2.5, H&H of 13.2/38.6 with a platelet count of 226.  HIV, hepatitis C was negative.  B12 and folate were normal.  ANA comprehensive panel negative.  Flow cytometry unremarkable.  Smear review was unremarkable.  Interval history Patient is a 59 year old female with a past medical history significant for anxiety and  depression, hyperlipidemia among other medical problems.  She has been referred to [place] for neutropenia.  Her most recent CBC from 02/15/2020 showed white count of 4.1, H&H of 13/38.6 and a platelet count of 211.  Differential showed neutropenia with an ANC of 1.3 and neutrophil percentage of 30% with relative lymphocytosis of 56%.  Prior to that her CBC on 01/14/2020 showed a white count of 3.6 with an [place] of 1.2 patient denies any recent changes to her medications.  Denies any recent viral illnesses or over-the-counter herbal supplements.  She feels well overall with a stable appetite and weight.  Denies any recurrent infections or hospitalizations    Review of Systems  Constitutional: Negative for chills, fever, malaise/fatigue and weight loss.  HENT: Negative for congestion, ear discharge and nosebleeds.   Eyes: Negative for blurred vision.  Respiratory: Negative for cough, hemoptysis, sputum production, shortness of breath and wheezing.   Cardiovascular: Negative for chest pain, palpitations, orthopnea and claudication.  Gastrointestinal: Negative for abdominal pain, blood in stool, constipation, diarrhea, heartburn, melena, nausea and vomiting.  Genitourinary: Negative for dysuria, flank pain, frequency, hematuria and urgency.  Musculoskeletal: Negative for back pain, joint pain and myalgias.  Skin: Negative for rash.  Neurological: Negative for dizziness, tingling, focal weakness, seizures, weakness and headaches.  Endo/Heme/Allergies: Does not bruise/bleed easily.  Psychiatric/Behavioral: Negative for depression and suicidal ideas. The patient does not have insomnia.     No Known Allergies  Past Medical History:  Diagnosis Date  . Anxiety   . Arthritis    hips, low back  . Breast mass, right   . Chicken pox   .  Depression   . GERD (gastroesophageal reflux disease)   . Plantar fasciitis   . PONV (postoperative nausea and vomiting)   . Vertigo     Past Surgical History:   Procedure Laterality Date  . BREAST BIOPSY Right 10/17/2015   stereo/atypical ductal hyperplasia  . BREAST BIOPSY Left 11/24/2017   x marker, PSEUDO-ANGIOMATOUS STROMAL HYPERPLASIA  . BREAST EXCISIONAL BIOPSY Right 2017   atypical ductal hyperplasia excisied  . BREAST SURGERY Bilateral 2010   Reduction   . CARPAL TUNNEL RELEASE     Unsure of date and which laterality   . CERVICAL SPINE SURGERY  2012   Discectomy and fusion of C4,5, and 6 Dr. Kendal Hymen   . LASIK Bilateral 2001  . RADIOACTIVE SEED GUIDED EXCISIONAL BREAST BIOPSY Right 12/04/2015   Procedure: RADIOACTIVE SEED GUIDED EXCISIONAL BREAST BIOPSY;  Surgeon: Rolm Bookbinder, MD;  Location: [place] [facility];  Service: General;  Laterality: Right;  . REDUCTION MAMMAPLASTY Bilateral 2010  . SHOULDER SURGERY Left 1982   bone spur removed/ bone separation repair  . thumb joint replacement     L thumb Dr. Amedeo Plenty   . TONSILLECTOMY AND ADENOIDECTOMY  1967    Social History   Socioeconomic History  . Marital status: Married    Spouse name: Not on file  . Number of children: Not on file  . Years of education: Not on file  . Highest education level: Not on file  Occupational History  . Not on file  Tobacco Use  . Smoking status: Never Smoker  . Smokeless tobacco: Never Used  . Tobacco comment: socially in college, 1 pk lasted 1 month  Vaping Use  . Vaping Use: Never used  Substance and Sexual Activity  . Alcohol use: Yes    Alcohol/week: 0.0 - 1.0 standard drinks    Comment: social  . Drug use: No  . Sexual activity: Yes    Birth control/protection: Post-menopausal  Other Topics Concern  . 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 Difficulty of Paying Living Expenses: Not on file  Food Insecurity:   . Worried About Sales executive in the Last Year: Not on file  . Ran Out of Food in the Last Year: Not on file  Transportation Needs:   . Lack of Transportation (Medical): Not on file  . Lack of Transportation (Non-Medical): Not on file  Physical Activity:   . Days of Exercise per Week: Not on file  . Minutes of Exercise per Session: Not on file  Stress:   . Feeling of Stress : Not on file  Social Connections:   . Frequency of Communication with Friends and Family: Not on file  . Frequency of Social Gatherings with Friends and Family: Not on file  . Attends Religious Services: Not on file  . Active Member of Clubs or Organizations: Not on file  . Attends Archivist Meetings: Not on file  . Marital Status: Not on file  Intimate Partner Violence:   . Fear of Current or Ex-Partner: Not on file  . Emotionally Abused: Not on file  . Physically Abused: Not on file  . Sexually Abused: Not on file    Family History  Problem Relation Age of Onset  . Arthritis  Mother   . Hypertension Mother   . Kidney disease Mother   . Alcohol abuse Father   . Arthritis Father   . Lung cancer Father   . Hypertension Father   . Cancer Father        lung smoker   . Alcohol abuse Brother   . Hypertension Brother   . Arthritis Maternal Aunt   . Cancer Maternal Aunt        melanoma  . Arthritis Maternal Uncle   . Lung cancer Maternal Uncle   . Cancer Maternal Uncle        bladder not smoker   . Colon cancer Paternal Uncle   . Cancer Paternal Uncle        colon cancer older age 30s died in 86s  . Lung cancer Maternal Grandmother   . Hypertension Maternal Grandmother   . Cancer Maternal Grandmother        lung not smoker   . Rectal cancer Cousin        17 had rectal cancer   . Breast cancer Cousin 40     Current Outpatient Medications:  .  ALPRAZolam (XANAX) 0.25 MG tablet, Take 1 tablet (0.25 mg total) by mouth daily as needed for anxiety., Disp: 30 tablet, Rfl: 2 .  Ascorbic Acid (VITAMIN C) 1000 MG  tablet, Take 1,000 mg by mouth daily., Disp: , Rfl:  .  Biotin 10000 MCG TABS, , Disp: , Rfl:  .  Black Pepper-Turmeric (TURMERIC CURCUMIN) 09-998 MG CAPS, , Disp: , Rfl:  .  Calcium-Magnesium 70-83 MG CAPS, Take by mouth in the morning and at bedtime. Calm supplement, Disp: , Rfl:  .  Cholecalciferol (VITAMIN D) 125 MCG (5000 UT) CAPS, , Disp: , Rfl:  .  cyclobenzaprine (FLEXERIL) 10 MG tablet, TAKE 1 TABLET (10 MG TOTAL) BY MOUTH AT BEDTIME prn, Disp: 30 tablet, Rfl: 2 .  Multiple Vitamins-Minerals (WOMENS MULTIVITAMIN PLUS PO), Take by mouth once., Disp: , Rfl:  .  NONFORMULARY OR COMPOUNDED ITEM, 1% testosterone cream, apply 0.5 grams to the lower abdomen daily.  Dispense 15 grams. One Refill., Disp: 1 each, Rfl: 1 .  Omega-3 Fatty Acids (FISH OIL) 1000 MG CAPS, Take by mouth., Disp: , Rfl:  .  omeprazole (PRILOSEC) 20 MG capsule, Take 1 capsule (20 mg total) by mouth daily. 30 minutes before food, Disp: 90 capsule, Rfl: 3 .  pyridOXINE (VITAMIN B-6) 100 MG tablet, Take 100 mg by mouth daily. , Disp: , Rfl:  .  triamcinolone cream (KENALOG) 0.1 %, Apply 1 application topically 2 (two) times daily. Prn back for itching, Disp: 30 g, Rfl: 0 .  venlafaxine XR (EFFEXOR XR) 37.5 MG 24 hr capsule, Take 1 capsule (37.5 mg total) by mouth daily with breakfast., Disp: 90 capsule, Rfl: 3  No results found.  No images are attached to the encounter.   CMP Latest Ref Rng & Units 01/14/2020  Glucose 70 - 99 mg/dL 84  BUN 6 - 23 mg/dL 13  Creatinine 0.40 - 1.20 mg/dL 0.71  Sodium 135 - 145 mEq/L 139  Potassium 3.5 - 5.1 mEq/L 4.2  Chloride 96 - 112 mEq/L 101  CO2 19 - 32 mEq/L 33(H)  Calcium 8.4 - 10.5 mg/dL 9.6  Total Protein 6.0 - 8.3 g/dL 7.1  Total Bilirubin 0.2 - 1.2 mg/dL 0.4  Alkaline Phos 39 - 117 U/L 46  AST 0 - 37 U/L 21  ALT 0 - 35 U/L 22   CBC Latest Ref  Rng & Units 02/29/2020  WBC 4.0 - 10.5 K/uL 5.2  Hemoglobin 12.0 - 15.0 g/dL 13.2  Hematocrit 36 - 46 % 38.6  Platelets 150 -  400 K/uL 226     Observation/objective: Appears in no acute distress over video visit today.  Breathing is nonlabored  Assessment and plan: Patient is a 59 year old female referred for neutropenia likely benign  Patient has had a transient neutropenia With an [place] between 1.2-1.3 in September and October 2021.  Prior to that her [place] has been normal between 2.3.  On repeat blood work on 02/29/2020 her white count had normalized with an [place] of 2.3.  No cytometry, ANA comprehensive panel, smear review, B12 folate HIV and hepatitis C were all unremarkable.  She does not have any other cytopenias.  This can be monitored conservatively without the need for bone marrow biopsy at this time.  I will see her back in 6 months with a CBC with differential and at that point if her counts are normal she can continue to follow-up.  Dr. Terese Door.  Follow-up instructions: As above  I discussed the assessment and treatment plan with the patient. The patient was provided an opportunity to ask questions and all were answered. The patient agreed with the plan and demonstrated an understanding of the instructions.   The patient was advised to call back or seek an in-person evaluation if the symptoms worsen or if the condition fails to improve as anticipated.    Visit Diagnosis: 1. Other neutropenia ([place])     Dr. Randa Evens, MD, MPH [facility] at [facility] Tel- [phone] 03/17/2020 8:51 AM

## 2020-03-21 ENCOUNTER — Other Ambulatory Visit: Payer: Self-pay

## 2020-04-10 ENCOUNTER — Other Ambulatory Visit: Payer: Self-pay | Admitting: Obstetrics and Gynecology

## 2020-04-10 ENCOUNTER — Other Ambulatory Visit: Payer: Self-pay

## 2020-04-10 ENCOUNTER — Other Ambulatory Visit (INDEPENDENT_AMBULATORY_CARE_PROVIDER_SITE_OTHER): Payer: BC Managed Care – PPO

## 2020-04-10 DIAGNOSIS — D708 Other neutropenia: Secondary | ICD-10-CM

## 2020-04-11 ENCOUNTER — Other Ambulatory Visit: Payer: Self-pay | Admitting: *Deleted

## 2020-04-11 DIAGNOSIS — R6882 Decreased libido: Secondary | ICD-10-CM

## 2020-04-11 LAB — CBC WITH DIFFERENTIAL/PLATELET
Basophils Absolute: 0.1 10*3/uL (ref 0.0–0.2)
Basos: 1 %
EOS (ABSOLUTE): 0.1 10*3/uL (ref 0.0–0.4)
Eos: 2 %
Hematocrit: 41.6 % (ref 34.0–46.6)
Hemoglobin: 13.7 g/dL (ref 11.1–15.9)
Immature Grans (Abs): 0 10*3/uL (ref 0.0–0.1)
Immature Granulocytes: 0 %
Lymphocytes Absolute: 2 10*3/uL (ref 0.7–3.1)
Lymphs: 43 %
MCH: 30.6 pg (ref 26.6–33.0)
MCHC: 32.9 g/dL (ref 31.5–35.7)
MCV: 93 fL (ref 79–97)
Monocytes Absolute: 0.5 10*3/uL (ref 0.1–0.9)
Monocytes: 10 %
Neutrophils Absolute: 2.1 10*3/uL (ref 1.4–7.0)
Neutrophils: 44 %
Platelets: 229 10*3/uL (ref 150–450)
RBC: 4.47 x10E6/uL (ref 3.77–5.28)
RDW: 13.1 % (ref 11.7–15.4)
WBC: 4.7 10*3/uL (ref 3.4–10.8)

## 2020-04-12 ENCOUNTER — Telehealth: Payer: Self-pay | Admitting: *Deleted

## 2020-04-12 ENCOUNTER — Other Ambulatory Visit (INDEPENDENT_AMBULATORY_CARE_PROVIDER_SITE_OTHER): Payer: BC Managed Care – PPO

## 2020-04-12 ENCOUNTER — Other Ambulatory Visit: Payer: Self-pay

## 2020-04-12 DIAGNOSIS — R6882 Decreased libido: Secondary | ICD-10-CM

## 2020-04-12 NOTE — Telephone Encounter (Signed)
Patient notified of 04/10/20 lab results.  CBC with diff was drawn on 04/10/20 by [facility] in place of testosterone panel.  Patient will return for redraw at no charge.  Patient request copy of CBC to [facility]. I will notify [facility], will need new future order for CBC w/ diff. Patient verbalizes understanding and is agreeable.    Call placed to [facility] at [phone], office is closed, will call back at later time.   Copy of 12/6 labs faxed via Epic to Dr. Smith Robert.

## 2020-04-12 NOTE — Telephone Encounter (Signed)
Georgann Housekeeper, RN [facility] returned your call regarding a patient's labs.   Spoke with Steward Drone at [facility]. Advised CBC w/ diff collected on 04/10/20 and resulted. Patient notified, may need new order for future lab. Was advised patient still has active order for CBC w/diff, no additional orders needed.   Routing to provider FYI.  Will close encounter.

## 2020-04-12 NOTE — Telephone Encounter (Signed)
See telephone encounter dated 04/12/20.   Encounter closed.

## 2020-04-12 NOTE — Telephone Encounter (Signed)
Call placed to [facility], Left message to call Noreene Larsson, RN at [facility] [phone].

## 2020-04-15 LAB — TESTT+TESTF+SHBG
Sex Hormone Binding: 44.7 nmol/L (ref 17.3–125.0)
Testosterone, Free: 2.2 pg/mL (ref 0.0–4.2)
Testosterone, Total, LC/MS: 17.1 ng/dL

## 2020-04-18 ENCOUNTER — Telehealth: Payer: Self-pay

## 2020-04-18 DIAGNOSIS — R6882 Decreased libido: Secondary | ICD-10-CM

## 2020-04-18 NOTE — Telephone Encounter (Signed)
Spoke with pt. Pt given results and recommendations per Dr Oscar La. Pt states was out of Rx for 3-4 days before labs were drawn. Pt states has not noticed any changes in libido since taking Rx to lower abd daily after shower.   Pt also states having increased headaches since taking testosterone Rx, is this a side effect?   Advised will review with Dr Oscar La and return call. Pt agreeable. [employer].  Routing to Dr Oscar La, please advise .

## 2020-04-18 NOTE — Telephone Encounter (Signed)
-----   Message from Romualdo Bolk, MD sent at 04/16/2020  7:16 AM EST ----- There isn't a significant change in her testosterone levels. Please check how she has been taking the testosterone and confirm that she was taking it up to the time that we drew her blood. Please also see if she has noticed any changes with the testosterone.  We may need to adjust her dosing.

## 2020-04-26 NOTE — Telephone Encounter (Signed)
Spoke with pt. Pt states getting refill on 12/6 and has been using daily as prescribed. Pt given update and recommendations per Dr Oscar La. Pt agreeable to continue to use and have repeat lab in 1 month before medications out. Pt states headaches have improved.  Pt scheduled lab appt on 12/29 at 1115 am. Pt agreeable to date and time of appt.  Pt advised will give update to Dr Oscar La and will wait for results to make further recommendations. Pt verbalized understanding.  Lab level orders placed  Routing to Dr Oscar La for review Encounter closed

## 2020-04-26 NOTE — Telephone Encounter (Signed)
Please apologize to the patient for the delayed response. It is possible being off of the testosterone for 3-4 days affected her level. It is conceivable the testosterone caused headaches. If it wasn't helping does she want to continue? I think we would need to restart at the same dose and recheck her levels in a month while she is still on it if she wants to retry it.

## 2020-05-03 ENCOUNTER — Other Ambulatory Visit: Payer: Self-pay

## 2020-05-03 ENCOUNTER — Other Ambulatory Visit (INDEPENDENT_AMBULATORY_CARE_PROVIDER_SITE_OTHER): Payer: BC Managed Care – PPO

## 2020-05-03 DIAGNOSIS — R6882 Decreased libido: Secondary | ICD-10-CM

## 2020-05-06 LAB — TESTT+TESTF+SHBG
Sex Hormone Binding: 53.8 nmol/L (ref 17.3–125.0)
Testosterone, Free: 2.4 pg/mL (ref 0.0–4.2)
Testosterone, Total, LC/MS: 45 ng/dL

## 2020-08-23 ENCOUNTER — Encounter: Payer: Self-pay | Admitting: Internal Medicine

## 2020-08-24 ENCOUNTER — Other Ambulatory Visit: Payer: Self-pay | Admitting: Internal Medicine

## 2020-08-24 DIAGNOSIS — M62838 Other muscle spasm: Secondary | ICD-10-CM

## 2020-08-24 DIAGNOSIS — M542 Cervicalgia: Secondary | ICD-10-CM

## 2020-08-24 MED ORDER — CYCLOBENZAPRINE HCL 10 MG PO TABS: ORAL_TABLET | ORAL | 5 refills | Status: DC

## 2020-09-11 ENCOUNTER — Other Ambulatory Visit: Payer: Self-pay | Admitting: *Deleted

## 2020-09-11 DIAGNOSIS — D708 Other neutropenia: Secondary | ICD-10-CM

## 2020-09-14 ENCOUNTER — Inpatient Hospital Stay: Payer: BC Managed Care – PPO | Attending: Oncology

## 2020-09-14 ENCOUNTER — Inpatient Hospital Stay (HOSPITAL_BASED_OUTPATIENT_CLINIC_OR_DEPARTMENT_OTHER): Payer: BC Managed Care – PPO | Admitting: Oncology

## 2020-09-14 ENCOUNTER — Encounter: Payer: Self-pay | Admitting: Oncology

## 2020-09-14 VITALS — BP 135/90 | HR 70 | Temp 98.7°F | Resp 18 | Wt 157.0 lb

## 2020-09-14 DIAGNOSIS — Z780 Asymptomatic menopausal state: Secondary | ICD-10-CM | POA: Diagnosis not present

## 2020-09-14 DIAGNOSIS — Z7289 Other problems related to lifestyle: Secondary | ICD-10-CM | POA: Diagnosis not present

## 2020-09-14 DIAGNOSIS — Z800 Family history of malignant neoplasm of digestive organs: Secondary | ICD-10-CM | POA: Insufficient documentation

## 2020-09-14 DIAGNOSIS — Z803 Family history of malignant neoplasm of breast: Secondary | ICD-10-CM | POA: Diagnosis not present

## 2020-09-14 DIAGNOSIS — D708 Other neutropenia: Secondary | ICD-10-CM | POA: Insufficient documentation

## 2020-09-14 DIAGNOSIS — Z801 Family history of malignant neoplasm of trachea, bronchus and lung: Secondary | ICD-10-CM | POA: Insufficient documentation

## 2020-09-14 DIAGNOSIS — Z8052 Family history of malignant neoplasm of bladder: Secondary | ICD-10-CM | POA: Insufficient documentation

## 2020-09-14 DIAGNOSIS — Z808 Family history of malignant neoplasm of other organs or systems: Secondary | ICD-10-CM | POA: Diagnosis not present

## 2020-09-14 LAB — CBC WITH DIFFERENTIAL/PLATELET
Abs Immature Granulocytes: 0.01 10*3/uL (ref 0.00–0.07)
Basophils Absolute: 0.1 10*3/uL (ref 0.0–0.1)
Basophils Relative: 1 %
Eosinophils Absolute: 0.1 10*3/uL (ref 0.0–0.5)
Eosinophils Relative: 2 %
HCT: 36.4 % (ref 36.0–46.0)
Hemoglobin: 12.5 g/dL (ref 12.0–15.0)
Immature Granulocytes: 0 %
Lymphocytes Relative: 46 %
Lymphs Abs: 2.8 10*3/uL (ref 0.7–4.0)
MCH: 29.9 pg (ref 26.0–34.0)
MCHC: 34.3 g/dL (ref 30.0–36.0)
MCV: 87.1 fL (ref 80.0–100.0)
Monocytes Absolute: 0.6 10*3/uL (ref 0.1–1.0)
Monocytes Relative: 10 %
Neutro Abs: 2.4 10*3/uL (ref 1.7–7.7)
Neutrophils Relative %: 41 %
Platelets: 218 10*3/uL (ref 150–400)
RBC: 4.18 MIL/uL (ref 3.87–5.11)
RDW: 12.8 % (ref 11.5–15.5)
WBC: 5.9 10*3/uL (ref 4.0–10.5)
nRBC: 0 % (ref 0.0–0.2)

## 2020-09-14 NOTE — Progress Notes (Signed)
Pt in for follow up, denies any concerns today. 

## 2020-09-17 NOTE — Progress Notes (Signed)
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 She has been referred to [place] for neutropenia. Her most recent CBC from 02/15/2020 showed white count of 4.1, H&H of 13/38.6 and a platelet count of 211. Differential showed neutropenia with an ANC of 1.3 and neutrophil percentage of 30% with relative lymphocytosis of 56%. Prior to that her CBC on 01/14/2020 showed a white count of 3.6 with an ANC of 1.2 patient denies any recent changes to her medications. Denies any recent viral illnesses or over-the-counter herbal supplements. She feels well overall with a stable appetite and weight. Denies any recurrent infections or hospitalizations  Results of blood work from 02/29/2020 were as follows: CBC showed a white cell count of 5.2 with a normal differential and ANC of 2.5, H&H of 13.2/38.6 with a platelet count of 226.  HIV, hepatitis C was negative.  B12 and folate were normal.  ANA comprehensive panel negative.  Flow cytometry unremarkable.  Smear review was unremarkable.   Interval history-patient reports doing well presently.  She denies any episodes of recurrent infections or hospitalizations.  Appetite and weight have remained stable.  In fact she has had a 5 pound weight gain  ECOG PS- 1 Pain scale- 0 Opioid associated constipation- 0  Review of systems- Review of Systems  Constitutional: Negative for chills, fever, malaise/fatigue and weight loss.  HENT: Negative for  congestion, ear discharge and nosebleeds.   Eyes: Negative for blurred vision.  Respiratory: Negative for cough, hemoptysis, sputum production, shortness of breath and wheezing.   Cardiovascular: Negative for chest pain, palpitations, orthopnea and claudication.  Gastrointestinal: Negative for abdominal pain, blood in stool, constipation, diarrhea, heartburn, melena, nausea and vomiting.  Genitourinary: Negative for dysuria, flank pain, frequency, hematuria and urgency.  Musculoskeletal: Negative for back pain, joint pain and myalgias.  Skin: Negative for rash.  Neurological: Negative for dizziness, tingling, focal weakness, seizures, weakness and headaches.  Endo/Heme/Allergies: Does not bruise/bleed easily.  Psychiatric/Behavioral: Negative for depression and suicidal ideas. The patient does not have insomnia.       No Known Allergies   Past Medical History:  Diagnosis Date  . Anxiety   . Arthritis    hips, low back  . Breast mass, right   . Chicken pox   . Depression   . GERD (gastroesophageal reflux disease)   . Plantar fasciitis   . PONV (postoperative nausea and vomiting)   . Vertigo      Past Surgical History:  Procedure Laterality Date  . BREAST BIOPSY Right 10/17/2015   stereo/atypical ductal hyperplasia  . BREAST BIOPSY Left 11/24/2017   x marker, PSEUDO-ANGIOMATOUS STROMAL HYPERPLASIA  . BREAST EXCISIONAL BIOPSY Right 2017   atypical ductal hyperplasia excisied  . BREAST SURGERY Bilateral 2010   Reduction   . CARPAL TUNNEL RELEASE     Unsure of date and which laterality   . CERVICAL SPINE SURGERY  2012   Discectomy and fusion of C4,5, and 6 Dr. Gretta Began   . LASIK Bilateral 2001  . RADIOACTIVE SEED GUIDED EXCISIONAL BREAST BIOPSY Right 12/04/2015   Procedure:  RADIOACTIVE SEED GUIDED EXCISIONAL BREAST BIOPSY;  Surgeon: Emelia Loron, MD;  Location: [facility];  Service: General;  Laterality: Right;  . REDUCTION MAMMAPLASTY Bilateral 2010  .  SHOULDER SURGERY Left 1982   bone spur removed/ bone separation repair  . thumb joint replacement     L thumb Dr. Amanda Pea   . TONSILLECTOMY AND ADENOIDECTOMY  1967    Social History   Socioeconomic History  . Marital status: Married    Spouse name: Not on file  . Number of children: Not on file  . Years of education: Not on file  . Highest education level: Not on file  Occupational History  . Not on file  Tobacco Use  . Smoking status: Never Smoker  . Smokeless tobacco: Never Used  . Tobacco comment: socially in college, 1 pk lasted 1 month  Vaping Use  . Vaping Use: Never used  Substance and Sexual Activity  . Alcohol use: Yes    Alcohol/week: 0.0 - 1.0 standard drinks    Comment: social  . Drug use: No  . Sexual activity: Yes    Birth control/protection: Post-menopausal  Other Topics Concern  . 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 Arthritis Mother   . Hypertension Mother   . Kidney disease Mother   . Alcohol abuse Father   . Arthritis Father   . Lung cancer Father   . Hypertension Father   . Cancer Father        lung smoker   . Alcohol abuse Brother   . Hypertension Brother   . Arthritis Maternal Aunt   . Cancer Maternal Aunt        melanoma  . Arthritis Maternal Uncle   . Lung cancer Maternal Uncle   . Cancer Maternal Uncle        bladder not smoker   . Colon cancer Paternal Uncle   . Cancer Paternal Uncle        colon cancer older age 12s died in 81s  . Lung cancer Maternal Grandmother   . Hypertension Maternal  Grandmother   . Cancer Maternal Grandmother        lung not smoker   . Rectal cancer Cousin        50 had rectal cancer   . Breast cancer Cousin 40     Current Outpatient Medications:  .  Ascorbic Acid (VITAMIN C) 1000 MG tablet, Take 1,000 mg by mouth daily., Disp: , Rfl:  .  Biotin 69629 MCG TABS, , Disp: , Rfl:  .  Black Pepper-Turmeric (TURMERIC CURCUMIN) 09-998 MG CAPS, , Disp: , Rfl:  .  Calcium-Magnesium 70-83 MG CAPS, Take by mouth in the morning and at bedtime. Calm supplement, Disp: , Rfl:  .  Cholecalciferol (VITAMIN D) 125 MCG (5000 UT) CAPS, , Disp: , Rfl:  .  cyclobenzaprine (FLEXERIL) 10 MG tablet, TAKE 1 TABLET (10 MG TOTAL) BY MOUTH AT BEDTIME prn, Disp: 30 tablet, Rfl: 2 .  Multiple Vitamins-Minerals (WOMENS MULTIVITAMIN PLUS PO), Take by mouth once., Disp: , Rfl:  .  Omega-3 Fatty Acids (FISH OIL) 1000 MG CAPS, Take by mouth., Disp: , Rfl:  .  omeprazole (PRILOSEC) 20 MG capsule, Take 1 capsule (20 mg total) by mouth daily. 30 minutes before food, Disp: 90 capsule, Rfl: 3 .  pyridOXINE (VITAMIN B-6) 100 MG tablet, Take 100 mg by mouth daily. , Disp: , Rfl:  .  venlafaxine XR (EFFEXOR XR) 37.5 MG 24 hr capsule, Take 1 capsule (37.5 mg total) by mouth daily with breakfast., Disp: 90 capsule, Rfl: 3 .  ALPRAZolam (XANAX) 0.25 MG tablet, Take 1 tablet (0.25 mg total) by mouth daily as needed for anxiety. (Patient not taking: Reported on 09/14/2020), Disp: 30 tablet, Rfl: 2 .  cyclobenzaprine (FLEXERIL) 10 MG tablet, TAKE 1 TABLET (10 MG TOTAL) BY MOUTH AT BEDTIME prn (Patient not taking: Reported on 09/14/2020), Disp: 30 tablet, Rfl: 5  Physical exam:  Vitals:   09/14/20 1423  BP: 135/90  Pulse: 70  Resp: 18  Temp: 98.7 F (37.1 C)  TempSrc: Tympanic  SpO2: 100%  Weight: 157 lb (71.2 kg)   Physical Exam Constitutional:      General: She is not in acute distress. Cardiovascular:     Rate and Rhythm: Normal rate and regular rhythm.     Heart sounds: Normal heart  sounds.  Pulmonary:     Effort: Pulmonary effort is normal.     Breath sounds: Normal breath sounds.  Abdominal:     General: Bowel sounds are normal.     Palpations: Abdomen is soft.  Lymphadenopathy:     Comments: No palpable cervical, supraclavicular, axillary or inguinal adenopathy   Skin:    General: Skin is warm and dry.  Neurological:     Mental Status: She is alert and oriented to person, place, and time.      CMP Latest Ref Rng & Units 01/14/2020  Glucose 70 - 99 mg/dL 84  BUN 6 - 23 mg/dL 13  Creatinine 1.610.40 - 0.961.20 mg/dL 0.450.71  Sodium 409135 - 811145 mEq/L 139  Potassium 3.5 - 5.1 mEq/L 4.2  Chloride 96 - 112 mEq/L 101  CO2 19 - 32 mEq/L 33(H)  Calcium 8.4 - 10.5 mg/dL 9.6  Total Protein 6.0 - 8.3 g/dL 7.1  Total Bilirubin 0.2 - 1.2 mg/dL 0.4  Alkaline Phos 39 - 117 U/L 46  AST 0 - 37 U/L 21  ALT 0 - 35 U/L 22   CBC Latest Ref Rng & Units 09/14/2020  WBC 4.0 - 10.5 K/uL 5.9  Hemoglobin 12.0 - 15.0 g/dL 91.412.5  Hematocrit 78.236.0 - 46.0 % 36.4  Platelets 150 - 400 K/uL 218    Assessment and plan- Patient is a 60 y.o. female here for follow-up of neutropenia  Patient had transient neutropenia when her white count dropped to 3.6 in September 2021 with an ANC of 1.2.  In October her ANC was 1.3.  Subsequently her white cell count has normalized and presently at 5.9 with neutrophil count that has remained stable between 2-2.5 which is her baseline.  It is unclear as to what precipitated the neutropenia in September 2021.  Presently given her counts are normal and she does not require follow-up with me and can be referred to me in the future if questions or concerns arise   Visit Diagnosis 1. Other neutropenia (HCC)      Dr. Owens SharkArchana Breland Elders, MD, MPH [facility] at [facility]  [phone] 09/17/2020 8:03 AM

## 2020-12-12 ENCOUNTER — Other Ambulatory Visit: Payer: Self-pay | Admitting: Internal Medicine

## 2020-12-12 DIAGNOSIS — Z1231 Encounter for screening mammogram for malignant neoplasm of breast: Secondary | ICD-10-CM

## 2020-12-13 ENCOUNTER — Encounter: Payer: Self-pay | Admitting: Internal Medicine

## 2020-12-13 DIAGNOSIS — K219 Gastro-esophageal reflux disease without esophagitis: Secondary | ICD-10-CM

## 2020-12-13 MED ORDER — OMEPRAZOLE 20 MG PO CPDR: 20.0000 mg | DELAYED_RELEASE_CAPSULE | Freq: Every day | ORAL | 1 refills | Status: DC

## 2020-12-19 ENCOUNTER — Other Ambulatory Visit: Payer: Self-pay | Admitting: Internal Medicine

## 2020-12-19 DIAGNOSIS — R232 Flushing: Secondary | ICD-10-CM

## 2020-12-19 DIAGNOSIS — F32A Depression, unspecified: Secondary | ICD-10-CM

## 2020-12-19 DIAGNOSIS — F419 Anxiety disorder, unspecified: Secondary | ICD-10-CM

## 2021-01-22 ENCOUNTER — Ambulatory Visit
Admission: RE | Admit: 2021-01-22 | Discharge: 2021-01-22 | Disposition: A | Discharge status: Home / Self Care | Payer: BC Managed Care – PPO | Source: Ambulatory Visit | Attending: Internal Medicine | Admitting: Internal Medicine

## 2021-01-22 ENCOUNTER — Other Ambulatory Visit: Payer: Self-pay

## 2021-01-22 DIAGNOSIS — Z1231 Encounter for screening mammogram for malignant neoplasm of breast: Secondary | ICD-10-CM | POA: Insufficient documentation

## 2021-02-01 ENCOUNTER — Telehealth: Payer: Self-pay | Admitting: *Deleted

## 2021-02-01 ENCOUNTER — Telehealth: Payer: BC Managed Care – PPO | Admitting: Physician Assistant

## 2021-02-01 ENCOUNTER — Encounter: Payer: Self-pay | Admitting: Internal Medicine

## 2021-02-01 ENCOUNTER — Encounter (INDEPENDENT_AMBULATORY_CARE_PROVIDER_SITE_OTHER): Payer: Self-pay

## 2021-02-01 DIAGNOSIS — U071 COVID-19: Secondary | ICD-10-CM | POA: Diagnosis not present

## 2021-02-01 MED ORDER — ALBUTEROL SULFATE HFA 108 (90 BASE) MCG/ACT IN AERS: 2.0000 | INHALATION_SPRAY | Freq: Four times a day (QID) | RESPIRATORY_TRACT | 0 refills | Status: DC | PRN

## 2021-02-01 MED ORDER — BENZONATATE 100 MG PO CAPS: 100.0000 mg | ORAL_CAPSULE | Freq: Three times a day (TID) | ORAL | 0 refills | Status: DC | PRN

## 2021-02-01 MED ORDER — MOLNUPIRAVIR EUA 200MG CAPSULE: 4.0000 | ORAL_CAPSULE | Freq: Two times a day (BID) | ORAL | 0 refills | Status: AC

## 2021-02-01 NOTE — Telephone Encounter (Signed)
[facility] called back to discuss symptoms. Pt states that her symptoms started on Monday morning and she took a at home covid test that came back positive. Pt was concerned about her cough that worsened on 01/31/21. Pt wanted to know if she could receive a stronger cough medication but was unsure if she wanted an Antiviral. Pt was informed that she would need an appointment to receive any medication and that the doctors present would want her to be evaluated and was given the option of a Mychart Urgent care visit. Pt accepted and states that she will contact a [place] doctor through [facility] for her covid symptoms. Pt was shown the initial steps to speak to a [facility] doctor through [place], she verbalized understanding and had no further questions.

## 2021-02-01 NOTE — Progress Notes (Addendum)
Virtual Visit Consent   Yvonne Moore, you are scheduled for a virtual visit with a [place] provider today.     Just as with appointments in the office, your consent must be obtained to participate.  Your consent will be active for this visit and any virtual visit you may have with one of our providers in the next 365 days.     If you have a MyChart account, a copy of this consent can be sent to you electronically.  All virtual visits are billed to your insurance company just like a traditional visit in the office.    As this is a virtual visit, video technology does not allow for your provider to perform a traditional examination.  This may limit your provider's ability to fully assess your condition.  If your provider identifies any concerns that need to be evaluated in person or the need to arrange testing (such as labs, EKG, etc.), we will make arrangements to do so.     Although advances in technology are sophisticated, we cannot ensure that it will always work on either your end or our end.  If the connection with a video visit is poor, the visit may have to be switched to a telephone visit.  With either a video or telephone visit, we are not always able to ensure that we have a secure connection.     I need to obtain your verbal consent now.   Are you willing to proceed with your visit today?    Yvonne Moore has provided verbal consent on 02/01/2021 for a virtual visit (video or telephone).   Yvonne Moore, [place]   Date: 02/01/2021 11:49 AM   Virtual Visit via Video Note   I, Yvonne Climes, PA-C, attempted to connect with Yvonne Moore; MRN [id number] on 02/01/21 via [facility] to complete a video urgent care visit. The patient was unable to successfully connect to the video platform. As such, the patient was contacted by this provider via phone to complete the encounter.   Location: Patient: Virtual Visit Location Patient: Home Provider: Virtual Visit Location  Provider: Home Office   I discussed the limitations of evaluation and management by telemedicine and the availability of in person appointments. The patient expressed understanding and agreed to proceed.    History of Present Illness: Yvonne Moore is a 60 y.o. who identifies as a female who was assigned female at birth, and is being seen today for COVID-19 and worsening cough. Notes Monday she developed symptoms, waking up with head/chest congestion and sore throat. As such she took a home COVID test which came back positive. Since then has developed chills, aches, headache, sinus pressure and now with more substantial cough that is impacting sleep. .Denies overt SOB but notes chest tightness. Is taking Sudafed OTC, Robitussin-DM and Tylenol for symptoms.   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   Breast cancer screening, high risk patient 11/03/2015   Paresthesia of right lower extremity 08/28/2015   Encounter to establish care 08/13/2015   Arthralgia of multiple joints  08/13/2015   GERD (gastroesophageal reflux disease) 08/13/2015   Anxiety and depression 08/13/2015   Elevated BP 08/13/2015   Tendinitis of thumb 08/13/2015    Allergies: No Known Allergies Medications:  Current Outpatient Medications:    albuterol (VENTOLIN HFA) 108 (90 Base) MCG/ACT inhaler, Inhale 2 puffs into the lungs every 6 (six) hours as needed for wheezing or shortness of breath., Disp: 8 g, Rfl: 0   benzonatate (TESSALON) 100 MG capsule, Take 1 capsule (100 mg total) by mouth 3  (three) times daily as needed for cough., Disp: 30 capsule, Rfl: 0   molnupiravir EUA (LAGEVRIO) 200 mg CAPS capsule, Take 4 capsules (800 mg total) by mouth 2 (two) times daily for 5 days., Disp: 40 capsule, Rfl: 0   ALPRAZolam (XANAX) 0.25 MG tablet, Take 1 tablet (0.25 mg total) by mouth daily as needed for anxiety. (Patient not taking: Reported on 09/14/2020), Disp: 30 tablet, Rfl: 2   Ascorbic Acid (VITAMIN C) 1000 MG tablet, Take 1,000 mg by mouth daily., Disp: , Rfl:    Biotin 44315 MCG TABS, , Disp: , Rfl:    Black Pepper-Turmeric (TURMERIC CURCUMIN) 09-998 MG CAPS, , Disp: , Rfl:    Calcium-Magnesium 70-83 MG CAPS, Take by mouth in the morning and at bedtime. Calm supplement, Disp: , Rfl:    Cholecalciferol (VITAMIN D) 125 MCG (5000 UT) CAPS, , Disp: , Rfl:    cyclobenzaprine (FLEXERIL) 10 MG tablet, TAKE 1 TABLET (10 MG TOTAL) BY MOUTH AT BEDTIME prn, Disp: 30 tablet, Rfl: 2   Multiple Vitamins-Minerals (WOMENS MULTIVITAMIN PLUS PO), Take by mouth once., Disp: , Rfl:    Omega-3 Fatty Acids (FISH OIL) 1000 MG CAPS, Take by mouth., Disp: , Rfl:    omeprazole (PRILOSEC) 20 MG capsule, Take 1 capsule (20 mg total) by mouth daily. 30 minutes before food, Disp: 90 capsule, Rfl: 1   pyridOXINE (VITAMIN B-6) 100 MG tablet, Take 100 mg by mouth daily. , Disp: , Rfl:   Observations/Objective: Patient is well-developed, well-nourished in no acute distress.  Resting comfortably at home.  Head is normocephalic, atraumatic.  No labored breathing. Speech is clear and coherent with logical content.  Patient is alert and oriented at baseline.   Assessment and Plan: 1. COVID-19 - MyChart COVID-19 home monitoring program; Future - benzonatate (TESSALON) 100 MG capsule; Take 1 capsule (100 mg total) by mouth 3 (three) times daily as needed for cough.  Dispense: 30 capsule; Refill: 0 - albuterol (VENTOLIN HFA) 108 (90 Base) MCG/ACT inhaler; Inhale 2 puffs into the lungs every 6 (six) hours as needed  for wheezing or shortness of breath.  Dispense: 8 g; Refill: 0 - molnupiravir EUA (LAGEVRIO) 200 mg CAPS capsule; Take 4 capsules (800 mg total) by mouth 2 (two) times daily for 5 days.  Dispense: 40 capsule; Refill: 0 Giving progressive symptoms, would consider antiviral. Discussed risks/benefits of antiviral medications including most common potential ADRs. Patient voiced understanding and would like to proceed with antiviral medication. They are candidate for molnupiravir. Rx sent to pharmacy. Supportive measures, OTC medications and vitamin regimen reviewed. Tessalon and Albuterol per orders. Patient has been enrolled in a MyChart COVID symptom monitoring program. Anne Shutter reviewed in detail. Strict ER precautions discussed with patient.    Follow Up Instructions: I discussed the assessment and treatment plan with the patient. The patient was provided an opportunity to ask questions and all were answered. The patient agreed with the plan and demonstrated an understanding of the instructions.  A copy  of instructions were sent to the patient via MyChart unless otherwise noted below.   The patient was advised to call back or seek an in-person evaluation if the symptoms worsen or if the condition fails to improve as anticipated.  Time:  I spent 15 minutes with the patient via telehealth technology discussing the above problems/concerns.    Yvonne Climes, PA-C

## 2021-02-01 NOTE — Telephone Encounter (Signed)
Addressed in telephone encounter.

## 2021-02-01 NOTE — Telephone Encounter (Signed)
Called to speak with [facility] for triage. Pt needs a virtual appointment to discuss symptoms and potential cough medication. Left a message to call back.

## 2021-02-01 NOTE — Patient Instructions (Addendum)
Francesca Oman, thank you for joining Piedad Climes, PA-C for today's virtual visit.  While this provider is not your primary care provider (PCP), if your PCP is located in our provider database this encounter information will be shared with them immediately following your visit.  Consent: (Patient) Yvonne Moore provided verbal consent for this virtual visit at the beginning of the encounter.  Current Medications:  Current Outpatient Medications:    albuterol (VENTOLIN HFA) 108 (90 Base) MCG/ACT inhaler, Inhale 2 puffs into the lungs every 6 (six) hours as needed for wheezing or shortness of breath., Disp: 8 g, Rfl: 0   benzonatate (TESSALON) 100 MG capsule, Take 1 capsule (100 mg total) by mouth 3 (three) times daily as needed for cough., Disp: 30 capsule, Rfl: 0   molnupiravir EUA (LAGEVRIO) 200 mg CAPS capsule, Take 4 capsules (800 mg total) by mouth 2 (two) times daily for 5 days., Disp: 40 capsule, Rfl: 0   ALPRAZolam (XANAX) 0.25 MG tablet, Take 1 tablet (0.25 mg total) by mouth daily as needed for anxiety. (Patient not taking: Reported on 09/14/2020), Disp: 30 tablet, Rfl: 2   Ascorbic Acid (VITAMIN C) 1000 MG tablet, Take 1,000 mg by mouth daily., Disp: , Rfl:    Biotin 24401 MCG TABS, , Disp: , Rfl:    Black Pepper-Turmeric (TURMERIC CURCUMIN) 09-998 MG CAPS, , Disp: , Rfl:    Calcium-Magnesium 70-83 MG CAPS, Take by mouth in the morning and at bedtime. Calm supplement, Disp: , Rfl:    Cholecalciferol (VITAMIN D) 125 MCG (5000 UT) CAPS, , Disp: , Rfl:    cyclobenzaprine (FLEXERIL) 10 MG tablet, TAKE 1 TABLET (10 MG TOTAL) BY MOUTH AT BEDTIME prn, Disp: 30 tablet, Rfl: 2   Multiple Vitamins-Minerals (WOMENS MULTIVITAMIN PLUS PO), Take by mouth once., Disp: , Rfl:    Omega-3 Fatty Acids (FISH OIL) 1000 MG CAPS, Take by mouth., Disp: , Rfl:    omeprazole (PRILOSEC) 20 MG capsule, Take 1 capsule (20 mg total) by mouth daily. 30 minutes before food, Disp: 90 capsule, Rfl: 1    pyridOXINE (VITAMIN B-6) 100 MG tablet, Take 100 mg by mouth daily. , Disp: , Rfl:    Medications ordered in this encounter:  Meds ordered this encounter  Medications   benzonatate (TESSALON) 100 MG capsule    Sig: Take 1 capsule (100 mg total) by mouth 3 (three) times daily as needed for cough.    Dispense:  30 capsule    Refill:  0    Order Specific Question:   Supervising Provider    Answer:   MILLER, BRIAN [[id number]]   albuterol (VENTOLIN HFA) 108 (90 Base) MCG/ACT inhaler    Sig: Inhale 2 puffs into the lungs every 6 (six) hours as needed for wheezing or shortness of breath.    Dispense:  8 g    Refill:  0    Order Specific Question:   Supervising Provider    Answer:   MILLER, BRIAN [[id number]]   molnupiravir EUA (LAGEVRIO) 200 mg CAPS capsule    Sig: Take 4 capsules (800 mg total) by mouth 2 (two) times daily for 5 days.    Dispense:  40 capsule    Refill:  0    Order Specific Question:   Supervising Provider    Answer:   Hyacinth Meeker, BRIAN [[id number]]     *If you need refills on other medications prior to your next appointment, please contact your pharmacy*  Follow-Up: Call back or seek an in-person evaluation  if the symptoms worsen or if the condition fails to improve as anticipated.  Other Instructions Please keep well-hydrated and get plenty of rest. Start a saline nasal rinse to flush out your nasal passages. You can use plain Mucinex to help thin congestion. If you have a humidifier, running in the bedroom at night. I want you to start OTC vitamin D3 1000 units daily, vitamin C 1000 mg daily, and a zinc supplement. Please take prescribed medications as directed.  You have been enrolled in a MyChart symptom monitoring program. Please answer these questions daily so we can keep track of how you are doing.  You were to quarantine for 5 days from onset of your symptoms.  After day 5, if you have had no fever and you are feeling better, you can end quarantine but need to mask for an  additional 5 days. After day 5 if you have a fever or are having significant symptoms, please quarantine for full 10 days.  If you note any worsening of symptoms, any significant shortness of breath or any chest pain, please seek ER evaluation ASAP.  Please do not delay care!  COVID-19: What to Do if You Are Sick If you test positive and are an older adult or someone who is at high risk of getting very sick from COVID-19, treatment may be available. Contact a healthcare provider right away after a positive test to determine if you are eligible, even if your symptoms are mild right now. You can also visit a Test to Treat location and, if eligible, receive a prescription from a provider. Don't delay: Treatment must be started within the first few days to be effective. If you have a fever, cough, or other symptoms, you might have COVID-19. Most people have mild illness and are able to recover at home. If you are sick: Keep track of your symptoms. If you have an emergency warning sign (including trouble breathing), call 911. Steps to help prevent the spread of COVID-19 if you are sick If you are sick with COVID-19 or think you might have COVID-19, follow the steps below to care for yourself and to help protect other people in your home and community. Stay home except to get medical care Stay home. Most people with COVID-19 have mild illness and can recover at home without medical care. Do not leave your home, except to get medical care. Do not visit public areas and do not go to places where you are unable to wear a mask. Take care of yourself. Get rest and stay hydrated. Take over-the-counter medicines, such as acetaminophen, to help you feel better. Stay in touch with your doctor. Call before you get medical care. Be sure to get care if you have trouble breathing, or have any other emergency warning signs, or if you think it is an emergency. Avoid public transportation, ride-sharing, or taxis if  possible. Get tested If you have symptoms of COVID-19, get tested. While waiting for test results, stay away from others, including staying apart from those living in your household. Get tested as soon as possible after your symptoms start. Treatments may be available for people with COVID-19 who are at risk for becoming very sick. Don't delay: Treatment must be started early to be effective--some treatments must begin within 5 days of your first symptoms. Contact your healthcare provider right away if your test result is positive to determine if you are eligible. Self-tests are one of several options for testing for the virus that causes  COVID-19 and may be more convenient than laboratory-based tests and point-of-care tests. Ask your healthcare provider or your local health department if you need help interpreting your test results. You can visit your state, tribal, local, and territorial health department's website to look for the latest local information on testing sites. Separate yourself from other people As much as possible, stay in a specific room and away from other people and pets in your home. If possible, you should use a separate bathroom. If you need to be around other people or animals in or outside of the home, wear a well-fitting mask. Tell your close contacts that they may have been exposed to COVID-19. An infected person can spread COVID-19 starting 48 hours (or 2 days) before the person has any symptoms or tests positive. By letting your close contacts know they may have been exposed to COVID-19, you are helping to protect everyone. See COVID-19 and Animals if you have questions about pets. If you are diagnosed with COVID-19, someone from the health department may call you. Answer the call to slow the spread. Monitor your symptoms Symptoms of COVID-19 include fever, cough, or other symptoms. Follow care instructions from your healthcare provider and local health department. Your local  health authorities may give instructions on checking your symptoms and reporting information. When to seek emergency medical attention Look for emergency warning signs* for COVID-19. If someone is showing any of these signs, seek emergency medical care immediately: Trouble breathing Persistent pain or pressure in the chest New confusion Inability to wake or stay awake Pale, gray, or blue-colored skin, lips, or nail beds, depending on skin tone *This list is not all possible symptoms. Please call your medical provider for any other symptoms that are severe or concerning to you. Call 911 or call ahead to your local emergency facility: Notify the operator that you are seeking care for someone who has or may have COVID-19. Call ahead before visiting your doctor Call ahead. Many medical visits for routine care are being postponed or done by phone or telemedicine. If you have a medical appointment that cannot be postponed, call your doctor's office, and tell them you have or may have COVID-19. This will help the office protect themselves and other patients. If you are sick, wear a well-fitting mask You should wear a mask if you must be around other people or animals, including pets (even at home). Wear a mask with the best fit, protection, and comfort for you. You don't need to wear the mask if you are alone. If you can't put on a mask (because of trouble breathing, for example), cover your coughs and sneezes in some other way. Try to stay at least 6 feet away from other people. This will help protect the people around you. Masks should not be placed on young children under age 95 years, anyone who has trouble breathing, or anyone who is not able to remove the mask without help. Cover your coughs and sneezes Cover your mouth and nose with a tissue when you cough or sneeze. Throw away used tissues in a lined trash can. Immediately wash your hands with soap and water for at least 20 seconds. If soap and  water are not available, clean your hands with an alcohol-based hand sanitizer that contains at least 60% alcohol. Clean your hands often Wash your hands often with soap and water for at least 20 seconds. This is especially important after blowing your nose, coughing, or sneezing; going to the bathroom; and  before eating or preparing food. Use hand sanitizer if soap and water are not available. Use an alcohol-based hand sanitizer with at least 60% alcohol, covering all surfaces of your hands and rubbing them together until they feel dry. Soap and water are the best option, especially if hands are visibly dirty. Avoid touching your eyes, nose, and mouth with unwashed hands. Handwashing Tips Avoid sharing personal household items Do not share dishes, drinking glasses, cups, eating utensils, towels, or bedding with other people in your home. Wash these items thoroughly after using them with soap and water or put in the dishwasher. Clean surfaces in your home regularly Clean and disinfect high-touch surfaces (for example, doorknobs, tables, handles, light switches, and countertops) in your "sick room" and bathroom. In shared spaces, you should clean and disinfect surfaces and items after each use by the person who is ill. If you are sick and cannot clean, a caregiver or other person should only clean and disinfect the area around you (such as your bedroom and bathroom) on an as needed basis. Your caregiver/other person should wait as long as possible (at least several hours) and wear a mask before entering, cleaning, and disinfecting shared spaces that you use. Clean and disinfect areas that may have blood, stool, or body fluids on them. Use household cleaners and disinfectants. Clean visible dirty surfaces with household cleaners containing soap or detergent. Then, use a household disinfectant. Use a product from [employer] List N: Disinfectants for Coronavirus (COVID-19). Be sure to follow the instructions  on the label to ensure safe and effective use of the product. Many products recommend keeping the surface wet with a disinfectant for a certain period of time (look at "contact time" on the product label). You may also need to wear personal protective equipment, such as gloves, depending on the directions on the product label. Immediately after disinfecting, wash your hands with soap and water for 20 seconds. For completed guidance on cleaning and disinfecting your home, visit Complete Disinfection Guidance. Take steps to improve ventilation at home Improve ventilation (air flow) at home to help prevent from spreading COVID-19 to other people in your household. Clear out COVID-19 virus particles in the air by opening windows, using air filters, and turning on fans in your home. Use this interactive tool to learn how to improve air flow in your home. When you can be around others after being sick with COVID-19 Deciding when you can be around others is different for different situations. Find out when you can safely end home isolation. For any additional questions about your care, contact your healthcare provider or state or local health department. 07/25/2020 Content source: The [facility] for Immunization and Respiratory Diseases ([facility]), Division of Viral Diseases This information is not intended to replace advice given to you by your health care provider. Make sure you discuss any questions you have with your health care provider. Document Revised: 09/07/2020 Document Reviewed: 09/07/2020 Elsevier Patient Education  2022 [employer].   If you have been instructed to have an in-person evaluation today at a local Urgent Care facility, please use the link below. It will take you to a list of all of our available [facility], including address, phone number and hours of operation. Please do not delay care.  [facility]  If you or a family member do not have a primary  care provider, use the link below to schedule a visit and establish care. When you choose a [facility] primary  care physician or advanced practice provider, you gain a long-term partner in health. Find a Primary Care Provider  Learn more about [facility]'s in-office and virtual care options: [facility]

## 2021-02-01 NOTE — Progress Notes (Signed)
60 y.o. [id number] Married White or Caucasian Not Hispanic or Latino female here for annual exam.  No vaginal bleeding. Not sexually active (they are fine with it).   No bowel or bladder issues.   She had covid a few weeks ago. Felt awful, still has some residual aching and fatigue.     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  Normal colonoscopy in 2015, f/u in 10 years.    Pap from 02/07/20 negative, negative hpv. She needs a f/u pap in 3 years.   Patient's last menstrual period was 06/08/2016 (exact date).          Sexually active: No.  The current method of family planning is none.    Exercising: Yes.     Smoker:former  Health Maintenance: Pap:  02-07-20 normal History of abnormal Pap:  yes Had colpo for pos HPV MMG:  01-22-21 normal BMD:   N/A Colonoscopy: 2014 normal TDaP:  2019 Gardasil: No   reports that she has quit smoking. Her smoking use included cigarettes. She has never used smokeless tobacco. She reports current alcohol use. She reports that she does not use drugs. Just occasional ETOH. 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 Son is local, married, no kids. Doreene Adas has two kids.   Past Medical History:  Diagnosis Date   Anxiety    Arthritis    hips, low back   Breast mass, right    Chicken pox    COVID    Depression    GERD (gastroesophageal reflux disease)    Plantar fasciitis    PONV (postoperative nausea and vomiting)    Vertigo     Past Surgical History:  Procedure Laterality Date   BREAST BIOPSY Right 10/17/2015   stereo/atypical ductal hyperplasia   BREAST BIOPSY Left 11/24/2017   x marker, PSEUDO-ANGIOMATOUS STROMAL HYPERPLASIA   BREAST EXCISIONAL BIOPSY Right 2017   atypical ductal hyperplasia excisied   BREAST SURGERY Bilateral 2010   Reduction    CARPAL TUNNEL RELEASE     Unsure of date and which  laterality    CERVICAL SPINE SURGERY  2012   Discectomy and fusion of C4,5, and 6 Dr. Gretta Began    LASIK Bilateral 2001   RADIOACTIVE SEED GUIDED EXCISIONAL BREAST BIOPSY Right 12/04/2015   Procedure: RADIOACTIVE SEED GUIDED EXCISIONAL BREAST BIOPSY;  Surgeon: Emelia Loron, MD;  Location: [facility];  Service: General;  Laterality: Right;   REDUCTION MAMMAPLASTY Bilateral 2010   SHOULDER SURGERY Left 1982   bone spur removed/ bone separation repair   thumb joint replacement     L thumb Dr. Amanda Pea    TONSILLECTOMY AND ADENOIDECTOMY  1967    Current Outpatient Medications  Medication Sig Dispense Refill   ALPRAZolam (XANAX) 0.25 MG tablet Take 1 tablet (0.25 mg total) by mouth daily as needed for anxiety. 30 tablet 2   Ascorbic Acid (VITAMIN C) 1000 MG tablet Take 1,000 mg by mouth daily.     benzonatate (TESSALON) 100 MG capsule Take 1 capsule (100 mg total) by mouth 3 (three) times daily as needed for cough. 30 capsule 0   Biotin 42595 MCG TABS      Black Pepper-Turmeric (TURMERIC CURCUMIN) 09-998 MG CAPS      Calcium-Magnesium 70-83 MG CAPS Take by mouth in the morning and at bedtime. Calm supplement     Cholecalciferol (VITAMIN D) 125 MCG (5000 UT)  CAPS      Multiple Vitamins-Minerals (WOMENS MULTIVITAMIN PLUS PO) Take by mouth once.     Omega-3 Fatty Acids (FISH OIL) 1000 MG CAPS Take by mouth.     omeprazole (PRILOSEC) 20 MG capsule Take 1 capsule (20 mg total) by mouth daily. 30 minutes before food 90 capsule 1   pyridOXINE (VITAMIN B-6) 100 MG tablet Take 100 mg by mouth daily.      albuterol (VENTOLIN HFA) 108 (90 Base) MCG/ACT inhaler Inhale 2 puffs into the lungs every 6 (six) hours as needed for wheezing or shortness of breath. (Patient not taking: Reported on 02/09/2021) 8 g 0   cyclobenzaprine (FLEXERIL) 10 MG tablet TAKE 1 TABLET (10 MG TOTAL) BY MOUTH AT BEDTIME prn (Patient not taking: Reported on 02/09/2021) 30 tablet 2   No current facility-administered  medications for this visit.    Family History  Problem Relation Age of Onset   Arthritis Mother    Hypertension Mother    Kidney disease Mother    Alcohol abuse Father    Arthritis Father    Lung cancer Father    Hypertension Father    Cancer Father        lung smoker    Arthritis Maternal Aunt    Cancer Maternal Aunt        melanoma   Arthritis Maternal Uncle    Lung cancer Maternal Uncle    Cancer Maternal Uncle        bladder not smoker    Colon cancer Paternal Uncle    Cancer Paternal Uncle        colon cancer older age 89s died in 70s   Lung cancer Maternal Grandmother    Hypertension Maternal Grandmother    Cancer Maternal Grandmother        lung not smoker    Breast cancer Cousin    Rectal cancer Cousin        50 had rectal cancer    Breast cancer Cousin 40   Alcohol abuse Brother    Hypertension Brother     Review of Systems  All other systems reviewed and are negative.  Exam:   BP 130/82 (Cuff Size: Normal)   Pulse 70   Ht 5\' 3"  (1.6 m)   Wt 160 lb (72.6 kg)   LMP 06/08/2016 (Exact Date) Comment: spotted in May for 2 days  SpO2 99%   BMI 28.34 kg/m   Weight change: @WEIGHTCHANGE @ Height:   Height: 5\' 3"  (160 cm)  Ht Readings from Last 3 Encounters:  02/09/21 5\' 3"  (1.6 m)  02/07/20 5' 3.75" (1.619 m)  01/14/20 5' 3.58" (1.615 m)    General appearance: alert, cooperative and appears stated age Head: Normocephalic, without obvious abnormality, atraumatic Neck: no adenopathy, supple, symmetrical, trachea midline and thyroid normal to inspection and palpation Lungs: clear to auscultation bilaterally Cardiovascular: regular rate and rhythm Breasts: normal appearance, no masses or tenderness Abdomen: soft, non-tender; non distended,  no masses,  no organomegaly Extremities: extremities normal, atraumatic, no cyanosis or edema Skin: Skin color, texture, turgor normal. No rashes or lesions Lymph nodes: Cervical, supraclavicular, and axillary nodes  normal. No abnormal inguinal nodes palpated Neurologic: Grossly normal   Pelvic: External genitalia:  no lesions              Urethra:  normal appearing urethra with no masses, tenderness or lesions              Bartholins and Skenes: normal  Vagina: atrophic appearing vagina with normal color and discharge, no lesions              Cervix: no lesions               Bimanual Exam:  Uterus:  normal size, contour, position, consistency, mobility, non-tender              Adnexa: no mass, fullness, tenderness               Rectovaginal: Confirms               Anus:  normal sphincter tone, no lesions  Kennon Portela, CMA chaperoned for the exam.  1. Well woman exam Discussed breast self exam Discussed calcium and vit D intake Mammogram and colonoscopy are UTD Flu shot today  2. Laboratory exam ordered as part of routine general medical examination Not fasting - CBC - Comprehensive metabolic panel - Lipid panel

## 2021-02-01 NOTE — Telephone Encounter (Signed)
BPA triggered for worsening cough. Pt states did tele visit today and was prescribed cough med which is helping. Advised to hydrate, if phlegm becomes discolored, contact PCP. Pt verbalizes understanding.

## 2021-02-09 ENCOUNTER — Ambulatory Visit (INDEPENDENT_AMBULATORY_CARE_PROVIDER_SITE_OTHER): Payer: BC Managed Care – PPO | Admitting: Obstetrics and Gynecology

## 2021-02-09 ENCOUNTER — Other Ambulatory Visit: Payer: Self-pay

## 2021-02-09 ENCOUNTER — Encounter: Payer: Self-pay | Admitting: Obstetrics and Gynecology

## 2021-02-09 VITALS — BP 130/82 | HR 70 | Ht 63.0 in | Wt 160.0 lb

## 2021-02-09 DIAGNOSIS — Z23 Encounter for immunization: Secondary | ICD-10-CM | POA: Diagnosis not present

## 2021-02-09 DIAGNOSIS — Z01419 Encounter for gynecological examination (general) (routine) without abnormal findings: Secondary | ICD-10-CM | POA: Diagnosis not present

## 2021-02-09 DIAGNOSIS — Z0000 Encounter for general adult medical examination without abnormal findings: Secondary | ICD-10-CM | POA: Diagnosis not present

## 2021-02-09 NOTE — Patient Instructions (Signed)

## 2021-02-10 LAB — COMPREHENSIVE METABOLIC PANEL
AG Ratio: 2 (calc) (ref 1.0–2.5)
ALT: 22 U/L (ref 6–29)
AST: 23 U/L (ref 10–35)
Albumin: 4.6 g/dL (ref 3.6–5.1)
Alkaline phosphatase (APISO): 53 U/L (ref 37–153)
BUN: 14 mg/dL (ref 7–25)
CO2: 31 mmol/L (ref 20–32)
Calcium: 9.9 mg/dL (ref 8.6–10.4)
Chloride: 101 mmol/L (ref 98–110)
Creat: 0.7 mg/dL (ref 0.50–1.03)
Globulin: 2.3 g/dL (calc) (ref 1.9–3.7)
Glucose, Bld: 92 mg/dL (ref 65–99)
Potassium: 4.4 mmol/L (ref 3.5–5.3)
Sodium: 138 mmol/L (ref 135–146)
Total Bilirubin: 0.4 mg/dL (ref 0.2–1.2)
Total Protein: 6.9 g/dL (ref 6.1–8.1)

## 2021-02-10 LAB — LIPID PANEL
Cholesterol: 205 mg/dL — ABNORMAL HIGH (ref <200)
HDL: 69 mg/dL (ref >=50)
LDL Cholesterol (Calc): 113 mg/dL (calc) — ABNORMAL HIGH
Non-HDL Cholesterol (Calc): 136 mg/dL (calc) — ABNORMAL HIGH (ref <130)
Total CHOL/HDL Ratio: 3 (calc) (ref <5.0)
Triglycerides: 120 mg/dL (ref <150)

## 2021-02-10 LAB — CBC
HCT: 41 % (ref 35.0–45.0)
Hemoglobin: 13.5 g/dL (ref 11.7–15.5)
MCH: 30 pg (ref 27.0–33.0)
MCHC: 32.9 g/dL (ref 32.0–36.0)
MCV: 91.1 fL (ref 80.0–100.0)
MPV: 10.1 fL (ref 7.5–12.5)
Platelets: 341 10*3/uL (ref 140–400)
RBC: 4.5 10*6/uL (ref 3.80–5.10)
RDW: 12.2 % (ref 11.0–15.0)
WBC: 7.2 10*3/uL (ref 3.8–10.8)

## 2021-02-12 ENCOUNTER — Encounter: Payer: Self-pay | Admitting: Obstetrics and Gynecology

## 2021-03-22 ENCOUNTER — Other Ambulatory Visit: Payer: Self-pay

## 2021-03-22 ENCOUNTER — Encounter: Payer: Self-pay | Admitting: Internal Medicine

## 2021-03-22 ENCOUNTER — Other Ambulatory Visit: Payer: Self-pay | Admitting: Internal Medicine

## 2021-03-22 ENCOUNTER — Ambulatory Visit (INDEPENDENT_AMBULATORY_CARE_PROVIDER_SITE_OTHER): Payer: BC Managed Care – PPO | Admitting: Internal Medicine

## 2021-03-22 VITALS — BP 120/79 | HR 71 | Temp 99.0°F | Ht 63.0 in | Wt 163.6 lb

## 2021-03-22 DIAGNOSIS — M62838 Other muscle spasm: Secondary | ICD-10-CM

## 2021-03-22 DIAGNOSIS — Z0000 Encounter for general adult medical examination without abnormal findings: Secondary | ICD-10-CM

## 2021-03-22 DIAGNOSIS — K219 Gastro-esophageal reflux disease without esophagitis: Secondary | ICD-10-CM | POA: Diagnosis not present

## 2021-03-22 DIAGNOSIS — F32A Depression, unspecified: Secondary | ICD-10-CM

## 2021-03-22 DIAGNOSIS — F419 Anxiety disorder, unspecified: Secondary | ICD-10-CM | POA: Diagnosis not present

## 2021-03-22 DIAGNOSIS — Z1389 Encounter for screening for other disorder: Secondary | ICD-10-CM

## 2021-03-22 DIAGNOSIS — Z1329 Encounter for screening for other suspected endocrine disorder: Secondary | ICD-10-CM

## 2021-03-22 DIAGNOSIS — M542 Cervicalgia: Secondary | ICD-10-CM

## 2021-03-22 DIAGNOSIS — Z1231 Encounter for screening mammogram for malignant neoplasm of breast: Secondary | ICD-10-CM | POA: Diagnosis not present

## 2021-03-22 DIAGNOSIS — E785 Hyperlipidemia, unspecified: Secondary | ICD-10-CM

## 2021-03-22 MED ORDER — OMEPRAZOLE 20 MG PO CPDR: DELAYED_RELEASE_CAPSULE | ORAL | 3 refills | Status: DC

## 2021-03-22 MED ORDER — ALPRAZOLAM 0.25 MG PO TABS: 0.2500 mg | ORAL_TABLET | Freq: Every day | ORAL | 2 refills | Status: DC | PRN

## 2021-03-22 MED ORDER — CYCLOBENZAPRINE HCL 10 MG PO TABS: ORAL_TABLET | ORAL | 5 refills | Status: DC

## 2021-03-22 NOTE — Patient Instructions (Addendum)
[facility]  [address] [place] [address] [phone]  [facility]  [address] [place] [address] [phone]    [facility] [place] [facility]  [address]  [place] [address]  [phone] Consider shingrix vaccines x 2 doses 2nd dose before 6 months    Cholesterol Content in Foods Cholesterol is a waxy, fat-like substance that helps to carry fat in the blood. The body needs cholesterol in small amounts, but too much cholesterol can cause damage to the arteries and heart. What foods have cholesterol? Cholesterol is found in animal-based foods, such as meat, seafood, and dairy. Generally, low-fat dairy and lean meats have less cholesterol than full-fat dairy and fatty meats. The milligrams of cholesterol per serving (mg per serving) of common cholesterol-containing foods are listed below. Meats and other proteins Egg -- one large whole egg has 186 mg. Veal shank -- 4 oz (113 g) has 141 mg. Lean ground [place] (93% lean) -- 4 oz (113 g) has 118 mg. Fat-trimmed lamb loin -- 4 oz (113 g) has 106 mg. Lean ground beef (90% lean) -- 4 oz (113 g) has 100 mg. Lobster -- 3.5 oz (99 g) has 90 mg. Pork loin chops -- 4 oz (113 g) has 86 mg. Canned salmon -- 3.5 oz (99 g) has 83 mg. Fat-trimmed beef top loin -- 4 oz (113 g) has 78 mg. Frankfurter -- 1 frank (3.5 oz or 99 g) has 77 mg. Crab -- 3.5 oz (99 g) has 71 mg. Roasted chicken without skin, white meat -- 4 oz (113 g) has 66 mg. Light bologna -- 2 oz (57 g) has 45 mg. Deli-cut [place] -- 2 oz (57 g) has 31 mg. Canned tuna -- 3.5 oz (99 g) has 31 mg. Tomasa Blase -- 1 oz (28 g) has 29 mg. Oysters and mussels (raw) -- 3.5 oz (99 g) has 25 mg. Mackerel -- 1 oz (28 g) has 22 mg. Trout -- 1 oz (28 g) has 20 mg. Pork sausage -- 1 link (1 oz or 28 g) has 17 mg. Salmon -- 1 oz (28 g) has 16 mg. Tilapia -- 1 oz (28 g) has 14 mg. Dairy Soft-serve ice cream --  cup (4  oz or 86 g) has 103 mg. Whole-milk yogurt -- 1 cup (8 oz or 245 g) has 29 mg. Cheddar cheese -- 1 oz (28 g) has 28 mg. American cheese -- 1 oz (28 g) has 28 mg. Whole milk -- 1 cup (8 oz or 250 mL) has 23 mg. 2% milk -- 1 cup (8 oz or 250 mL) has 18 mg. Cream cheese -- 1 tablespoon (Tbsp) (14.5 g) has 15 mg. Cottage cheese --  cup (4 oz or 113 g) has 14 mg. Low-fat (1%) milk -- 1 cup (8 oz or 250 mL) has 10 mg. Sour cream -- 1 Tbsp (12 g) has 8.5 mg. Low-fat yogurt -- 1 cup (8 oz or 245 g) has 8 mg. Nonfat Greek yogurt -- 1 cup (8 oz or 228 g) has 7 mg. Half-and-half cream -- 1 Tbsp (15 mL) has 5 mg. Fats and oils Cod liver oil -- 1 tablespoon (Tbsp) (13.6 g) has 82 mg. Butter -- 1 Tbsp (14 g) has 15 mg. Lard -- 1 Tbsp (12.8 g) has 14 mg. Bacon grease -- 1 Tbsp (12.9 g) has 14 mg. Mayonnaise -- 1 Tbsp (13.8 g) has 5-10 mg. Margarine -- 1 Tbsp (14  g) has 3-10 mg. The items listed above may not be a complete list of foods with cholesterol. Exact amounts of cholesterol in these foods may vary depending on specific ingredients and brands. Contact a dietitian for more information. What foods do not have cholesterol? Most plant-based foods do not have cholesterol unless you combine them with a food that has cholesterol. Foods without cholesterol include: Grains and cereals. Vegetables. Fruits. Vegetable oils, such as olive, canola, and sunflower oil. Legumes, such as peas, beans, and lentils. Nuts and seeds. Egg whites. The items listed above may not be a complete list of foods that do not have cholesterol. Contact a dietitian for more information. Summary The body needs cholesterol in small amounts, but too much cholesterol can cause damage to the arteries and heart. Cholesterol is found in animal-based foods, such as meat, seafood, and dairy. Generally, low-fat dairy and lean meats have less cholesterol than full-fat dairy and fatty meats. This information is not intended to replace  advice given to you by your health care provider. Make sure you discuss any questions you have with your health care provider. Document Revised: 09/01/2020 Document Reviewed: 09/01/2020 Elsevier Patient Education  2022 Elsevier Inc. High Cholesterol High cholesterol is a condition in which the blood has high levels of a white, waxy substance similar to fat (cholesterol). The liver makes all the cholesterol that the body needs. The human body needs small amounts of cholesterol to help build cells. A person gets extra or excess cholesterol from the food that he or she eats. The blood carries cholesterol from the liver to the rest of the body. If you have high cholesterol, deposits (plaques) may build up on the walls of your arteries. Arteries are the blood vessels that carry blood away from your heart. These plaques make the arteries narrow and stiff. Cholesterol plaques increase your risk for heart attack and stroke. Work with your health care provider to keep your cholesterol levels in a healthy range. What increases the risk? The following factors may make you more likely to develop this condition: Eating foods that are high in animal fat (saturated fat) or cholesterol. Being overweight. Not getting enough exercise. A family history of high cholesterol (familial hypercholesterolemia). Use of tobacco products. Having diabetes. What are the signs or symptoms? In most cases, high cholesterol does not usually cause any symptoms. In severe cases, very high cholesterol levels can cause: Fatty bumps under the skin (xanthomas). A white or gray ring around the black center (pupil) of the eye. How is this diagnosed? This condition may be diagnosed based on the results of a blood test. If you are older than 59 years of age, your health care provider may check your cholesterol levels every 4-6 years. You may be checked more often if you have high cholesterol or other risk factors for heart disease. The  blood test for cholesterol measures: "Bad" cholesterol, or LDL cholesterol. This is the main type of cholesterol that causes heart disease. The desired level is less than 100 mg/dL (6.56 mmol/L). "Good" cholesterol, or HDL cholesterol. HDL helps protect against heart disease by cleaning the arteries and carrying the LDL to the liver for processing. The desired level for HDL is 60 mg/dL (8.12 mmol/L) or higher. Triglycerides. These are fats that your body can store or burn for energy. The desired level is less than 150 mg/dL (7.51 mmol/L). Total cholesterol. This measures the total amount of cholesterol in your blood and includes LDL, HDL, and triglycerides. The desired  level is less than 200 mg/dL (1.61 mmol/L). How is this treated? Treatment for high cholesterol starts with lifestyle changes, such as diet and exercise. Diet changes. You may be asked to eat foods that have more fiber and less saturated fats or added sugar. Lifestyle changes. These may include regular exercise, maintaining a healthy weight, and quitting use of tobacco products. Medicines. These are given when diet and lifestyle changes have not worked. You may be prescribed a statin medicine to help lower your cholesterol levels. Follow these instructions at home: Eating and drinking  Eat a healthy, balanced diet. This diet includes: Daily servings of a variety of fresh, frozen, or canned fruits and vegetables. Daily servings of whole grain foods that are rich in fiber. Foods that are low in saturated fats and trans fats. These include poultry and fish without skin, lean cuts of meat, and low-fat dairy products. A variety of fish, especially oily fish that contain omega-3 fatty acids. Aim to eat fish at least 2 times a week. Avoid foods and drinks that have added sugar. Use healthy cooking methods, such as roasting, grilling, broiling, baking, poaching, steaming, and stir-frying. Do not fry your food except for stir-frying. If you  drink alcohol: Limit how much you have to: 0-1 drink a day for women who are not pregnant. 0-2 drinks a day for men. Know how much alcohol is in a drink. In the [place] one drink equals one 12 oz bottle of beer (355 mL), one 5 oz glass of wine (148 mL), or one 1 oz glass of hard liquor (44 mL). Lifestyle  Get regular exercise. Aim to exercise for a total of 150 minutes a week. Increase your activity level by doing activities such as gardening, walking, and taking the stairs. Do not use any products that contain nicotine or tobacco. These products include cigarettes, chewing tobacco, and vaping devices, such as e-cigarettes. If you need help quitting, ask your health care provider. General instructions Take over-the-counter and prescription medicines only as told by your health care provider. Keep all follow-up visits. This is important. Where to find more information American Heart Association: www.heart.org National Heart, Lung, and Blood Institute: PopSteam.is Contact a health care provider if: You have trouble achieving or maintaining a healthy diet or weight. You are starting an exercise program. You are unable to stop smoking. Get help right away if: You have chest pain. You have trouble breathing. You have discomfort or pain in your jaw, neck, back, shoulder, or arm. You have any symptoms of a stroke. "BE FAST" is an easy way to remember the main warning signs of a stroke: B - Balance. Signs are dizziness, sudden trouble walking, or loss of balance. E - Eyes. Signs are trouble seeing or a sudden change in vision. F - Face. Signs are sudden weakness or numbness of the face, or the face or eyelid drooping on one side. A - Arms. Signs are weakness or numbness in an arm. This happens suddenly and usually on one side of the body. S - Speech. Signs are sudden trouble speaking, slurred speech, or trouble understanding what people say. T - Time. Time to call emergency services. Write  down what time symptoms started. You have other signs of a stroke, such as: A sudden, severe headache with no known cause. Nausea or vomiting. Seizure. These symptoms may represent a serious problem that is an emergency. Do not wait to see if the symptoms will go away. Get medical help right away. Call your  local emergency services (911 in the [place] Do not drive yourself to the hospital. Summary Cholesterol plaques increase your risk for heart attack and stroke. Work with your health care provider to keep your cholesterol levels in a healthy range. Eat a healthy, balanced diet, get regular exercise, and maintain a healthy weight. Do not use any products that contain nicotine or tobacco. These products include cigarettes, chewing tobacco, and vaping devices, such as e-cigarettes. Get help right away if you have any symptoms of a stroke. This information is not intended to replace advice given to you by your health care provider. Make sure you discuss any questions you have with your health care provider. Document Revised: 07/06/2020 Document Reviewed: 06/26/2020 Elsevier Patient Education  2022 Elsevier Inc.   Mindfulness-Based Stress Reduction Mindfulness-based stress reduction (MBSR) is a program that helps people learn to practice mindfulness. Mindfulness is the practice of consciously paying attention to the present moment. MBSR focuses on developing self-awareness, which lets you respond to life stress without judgment or negative feelings. It can be learned and practiced through techniques such as education, breathing exercises, meditation, and yoga. MBSR includes several mindfulness techniques in one program. MBSR works best when you understand the treatment, are willing to try new things, and can commit to spending time practicing what you learn. MBSR training may include learning about: How your feelings, thoughts, and reactions affect your body. New ways to respond to things that cause  negative thoughts to start (triggers). How to notice your thoughts and let go of them. Practicing awareness of everyday things that you normally do without thinking. The techniques and goals of different types of meditation. What are the benefits of MBSR? MBSR can have many benefits, which include helping you to: Develop self-awareness. This means knowing and understanding yourself. Learn skills and attitudes that help you to take part in your own health care. Learn new ways to care for yourself. Be more accepting about how things are, and let things go. Be less judgmental and approach things with an open mind. Be patient with yourself and trust yourself more. MBSR has also been shown to: Reduce negative emotions, such as sadness, overwhelm, and worry. Improve memory and focus. Change how you sense and react to pain. Boost your body's ability to fight infections. Help you connect better with other people. Improve your sense of well-being. How to practice mindfulness To do a basic awareness exercise: Find a comfortable place to sit. Pay attention to the present moment. Notice your thoughts, feelings, and surroundings just as they are. Avoid judging yourself, your feelings, or your surroundings. Make note of any judgment that comes up and let it go. Your mind may wander, and that is okay. Make note of when your thoughts drift, and return your attention to the present moment. To do basic mindfulness meditation: Find a comfortable place to sit. This may include a stable chair or a firm floor cushion. Sit upright with your back straight. Let your arms fall next to your sides, with your hands resting on your legs. If you are sitting in a chair, rest your feet flat on the floor. If you are sitting on a cushion, cross your legs in front of you. Keep your head in a neutral position with your chin dropped slightly. Relax your jaw and rest the tip of your tongue on the roof of your mouth. Drop  your gaze to the floor or close your eyes. Breathe normally and pay attention to your breath. Feel the  air moving in and out of your nose. Feel your belly expanding and relaxing with each breath. Your mind may wander, and that is okay. Make note of when your thoughts drift, and return your attention to your breath. Avoid judging yourself, your feelings, or your surroundings. Make note of any judgment or feelings that come up, let them go, and bring your attention back to your breath. When you are ready, lift your gaze or open your eyes. Pay attention to how your body feels after the meditation. Follow these instructions at home:  Find a local in-person or online MBSR program. Set aside some time regularly for mindfulness practice. Practice every day if you can. Even 10 minutes of practice is helpful. Find a mindfulness practice that works best for you. This may include one or more of the following: Meditation. This involves focusing your mind on a certain thought or activity. Breathing awareness exercises. These help you to stay present by focusing on your breath. Body scan. For this practice, you lie down and pay attention to each part of your body from head to toe. You can identify tension and soreness and consciously relax parts of your body. Yoga. Yoga involves stretching and breathing, and it can improve your ability to move and be flexible. It can also help you to test your body's limits, which can help you release stress. Mindful eating. This way of eating involves focusing on the taste, texture, color, and smell of each bite of food. This slows down eating and helps you feel full sooner. For this reason, it can be an important part of a weight loss plan. Find a podcast or recording that provides guidance for breathing awareness, body scan, or meditation exercises. You can listen to these any time when you have a free moment to rest without distractions. Follow your treatment plan as told by  your health care provider. This may include taking regular medicines and making changes to your diet or lifestyle as recommended. Where to find more information You can find more information about MBSR from: Your health care provider. Community-based meditation centers or programs. Programs offered near you. Summary Mindfulness-based stress reduction (MBSR) is a program that teaches you how to consciously pay attention to the present moment. It is used to help you deal better with daily stress, feelings, and pain. MBSR focuses on developing self-awareness, which allows you to respond to life stress without judgment or negative feelings. MBSR programs may involve learning different mindfulness practices, such as breathing exercises, meditation, yoga, body scan, or mindful eating. Find a mindfulness practice that works best for you, and set aside time for it on a regular basis. This information is not intended to replace advice given to you by your health care provider. Make sure you discuss any questions you have with your health care provider. Document Revised: 11/30/2020 Document Reviewed: 11/30/2020 Elsevier Patient Education  2022 Elsevier Inc.  Zoster Vaccine, Recombinant injection What is this medication? ZOSTER VACCINE (ZOS ter vak SEEN) is a vaccine used to reduce the risk of getting shingles. This vaccine is not used to treat shingles or nerve pain from shingles. This medicine may be used for other purposes; ask your health care provider or pharmacist if you have questions. COMMON BRAND NAME(S): [facility] What should I tell my care team before I take this medication? They need to know if you have any of these conditions: cancer immune system problems an unusual or allergic reaction to Zoster vaccine, other medications, foods, dyes, or  preservatives pregnant or trying to get pregnant breast-feeding How should I use this medication? This vaccine is injected into a muscle. It is given  by a health care provider. A copy of Vaccine Information Statements will be given before each vaccination. Be sure to read this information carefully each time. This sheet may change often. Talk to your health care provider about the use of this vaccine in children. This vaccine is not approved for use in children. Overdosage: If you think you have taken too much of this medicine contact a poison control center or emergency room at once. NOTE: This medicine is only for you. Do not share this medicine with others. What if I miss a dose? Keep appointments for follow-up (booster) doses. It is important not to miss your dose. Call your health care provider if you are unable to keep an appointment. What may interact with this medication? medicines that suppress your immune system medicines to treat cancer steroid medicines like prednisone or cortisone This list may not describe all possible interactions. Give your health care provider a list of all the medicines, herbs, non-prescription drugs, or dietary supplements you use. Also tell them if you smoke, drink alcohol, or use illegal drugs. Some items may interact with your medicine. What should I watch for while using this medication? Visit your health care provider regularly. This vaccine, like all vaccines, may not fully protect everyone. What side effects may I notice from receiving this medication? Side effects that you should report to your doctor or health care professional as soon as possible: allergic reactions (skin rash, itching or hives; swelling of the face, lips, or tongue) trouble breathing Side effects that usually do not require medical attention (report these to your doctor or health care professional if they continue or are bothersome): chills headache fever nausea pain, redness, or irritation at site where injected tiredness vomiting This list may not describe all possible side effects. Call your doctor for medical advice  about side effects. You may report side effects to FDA at 1-800-FDA-1088. Where should I keep my medication? This vaccine is only given by a health care provider. It will not be stored at home. NOTE: This sheet is a summary. It may not cover all possible information. If you have questions about this medicine, talk to your doctor, pharmacist, or health care provider.  2022 Elsevier/Gold Standard (2021-01-09 00:00:00)

## 2021-03-22 NOTE — Progress Notes (Signed)
Chief Complaint  Patient presents with   Annual Exam    Medication refills   Annual  1. 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  HENT:  Negative for hearing loss.   Eyes:  Negative for blurred vision.  Respiratory:  Negative for shortness of breath.   Cardiovascular:  Negative for chest pain.  Gastrointestinal:  Negative for abdominal pain and blood in stool.  Genitourinary:  Negative for dysuria.  Musculoskeletal:  Negative for falls and joint pain.  Skin:  Negative for rash.  Neurological:  Negative for headaches.  Psychiatric/Behavioral:  Negative for depression.   Past Medical History:  Diagnosis Date   Anxiety    Arthritis    hips, low back   Breast mass, right    Chicken pox    COVID    end 01/2021   Depression    GERD (gastroesophageal reflux disease)    Plantar fasciitis    PONV (postoperative nausea and vomiting)    Vertigo    Past Surgical History:  Procedure Laterality Date   BREAST BIOPSY Right 10/17/2015   stereo/atypical ductal hyperplasia   BREAST BIOPSY Left 11/24/2017   x marker, PSEUDO-ANGIOMATOUS STROMAL HYPERPLASIA   BREAST EXCISIONAL BIOPSY Right 2017   atypical ductal hyperplasia excisied   BREAST SURGERY Bilateral 2010   Reduction    CARPAL TUNNEL RELEASE     Unsure of date and which laterality    CERVICAL SPINE SURGERY  2012   Discectomy and fusion of C4,5, and 6 Dr. Kendal Hymen    LASIK Bilateral 2001   [place] EXCISIONAL BREAST BIOPSY Right 12/04/2015   Procedure: RADIOACTIVE SEED GUIDED EXCISIONAL BREAST BIOPSY;  Surgeon: Rolm Bookbinder, MD;  Location: [facility];  Service: General;  Laterality: Right;   REDUCTION MAMMAPLASTY Bilateral  2010   SHOULDER SURGERY Left 1982   bone spur removed/ bone separation repair   thumb joint replacement     L thumb Dr. Amedeo Plenty    TONSILLECTOMY AND ADENOIDECTOMY  1967   Family History  Problem Relation Age of Onset   Arthritis Mother    Hypertension Mother    Kidney disease Mother    Alcohol abuse Father    Arthritis Father    Lung cancer Father    Hypertension Father    Cancer Father        lung smoker    Arthritis Maternal Aunt    Cancer Maternal Aunt        melanoma   Arthritis Maternal Uncle    Lung cancer Maternal Uncle    Cancer Maternal Uncle        bladder not smoker    Colon cancer Paternal Uncle    Cancer Paternal Uncle        colon cancer older age 43s died in 22s   Lung cancer Maternal Grandmother    Hypertension Maternal Grandmother    Cancer Maternal Grandmother        lung not smoker    Breast cancer Cousin    Rectal cancer Cousin        69 had rectal cancer    Breast cancer Cousin 33   Alcohol abuse Brother    Hypertension Brother  Social History   Socioeconomic History   Marital status: Married    Spouse name: Not on file   Number of children: Not on file   Years of education: Not on file   Highest education level: Not on file  Occupational History   Not on file  Tobacco Use   Smoking status: Former    Types: Cigarettes   Smokeless tobacco: Never   Tobacco comments:    socially in college, 1 pk lasted 1 month  Vaping Use   Vaping Use: Never used  Substance and Sexual Activity   Alcohol use: Yes    Alcohol/week: 0.0 - 1.0 standard drinks    Comment: social   Drug use: No   Sexual activity: Not Currently    Birth control/protection: Post-menopausal  Other Topics Concern   Not on file  Social History Narrative   Married   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   Biotin 10000 MCG TABS    Black Pepper-Turmeric (TURMERIC CURCUMIN) 09-998 MG CAPS    Calcium-Magnesium 70-83 MG CAPS Take by mouth in the morning and at bedtime. Calm supplement   Cholecalciferol (VITAMIN D) 125 MCG (5000 UT) CAPS    Multiple Vitamins-Minerals (WOMENS MULTIVITAMIN PLUS PO) Take by mouth once.   Omega-3 Fatty Acids (FISH OIL) 1000 MG CAPS Take by mouth.   pyridOXINE (VITAMIN B-6) 100 MG tablet Take 100 mg by mouth daily.    [DISCONTINUED] ALPRAZolam (XANAX) 0.25 MG tablet Take 1 tablet (0.25 mg total) by mouth daily as needed for anxiety.   [DISCONTINUED] cyclobenzaprine (FLEXERIL) 10 MG tablet TAKE 1 TABLET (10 MG TOTAL) BY MOUTH AT BEDTIME prn   [DISCONTINUED] omeprazole (PRILOSEC) 20 MG capsule TAKE 1 CAPSULE BY MOUTH ONCE DAILY 30 MINUTES BEFORE FOOD   Allergies  Allergen Reactions   Effexor Xr [Venlafaxine Hcl]     Worsening anxiety, thoughts of wanting to leave this earth tapered off end 12/2020/01/2021   Recent Results (from the past 2160 hour(s))  CBC     Status: None   Collection Time: 02/09/21  2:57 PM  Result Value Ref Range   WBC 7.2 3.8 - 10.8 Thousand/uL   RBC 4.50 3.80 - 5.10 Million/uL   Hemoglobin 13.5 11.7 - 15.5 g/dL   HCT 41.0 35.0 - 45.0 %   MCV 91.1 80.0 - 100.0 fL   MCH 30.0 27.0 - 33.0 pg   MCHC 32.9 32.0 - 36.0 g/dL   RDW 12.2 11.0 - 15.0 %   Platelets 341 140 - 400 Thousand/uL   MPV 10.1 7.5 - 12.5 fL  Comprehensive metabolic panel     Status: None   Collection Time: 02/09/21  2:57 PM  Result Value Ref Range   Glucose, Bld 92 65 - 99 mg/dL    Comment: .            Fasting reference interval .    BUN 14 7 - 25 mg/dL   Creat 0.70 0.50 - 1.03 mg/dL  BUN/Creatinine  Ratio NOT APPLICABLE 6 - 22 (calc)   Sodium 138 135 - 146 mmol/L   Potassium 4.4 3.5 - 5.3 mmol/L   Chloride 101 98 - 110 mmol/L   CO2 31 20 - 32 mmol/L   Calcium 9.9 8.6 - 10.4 mg/dL   Total Protein 6.9 6.1 - 8.1 g/dL   Albumin 4.6 3.6 - 5.1 g/dL   Globulin 2.3 1.9 - 3.7 g/dL (calc)   AG Ratio 2.0 1.0 - 2.5 (calc)   Total Bilirubin 0.4 0.2 - 1.2 mg/dL   Alkaline phosphatase (APISO) 53 37 - 153 U/L   AST 23 10 - 35 U/L   ALT 22 6 - 29 U/L  Lipid panel     Status: Abnormal   Collection Time: 02/09/21  2:57 PM  Result Value Ref Range   Cholesterol 205 (H) <200 mg/dL   HDL 69 > OR = 50 mg/dL   Triglycerides 120 <150 mg/dL   LDL Cholesterol (Calc) 113 (H) mg/dL (calc)    Comment: Reference range: <100 . Desirable range <100 mg/dL for primary prevention;   <70 mg/dL for patients with CHD or diabetic patients  with > or = 2 CHD risk factors. Marland Kitchen LDL-C is now calculated using the Martin-Hopkins  calculation, which is a validated novel method providing  better accuracy than the Friedewald equation in the  estimation of LDL-C.  Cresenciano Genre et al. Annamaria Helling. 3354;562(56): 2061-2068  (http://education.[facility].com/faq/FAQ164)    Total CHOL/HDL Ratio 3.0 <5.0 (calc)   Non-HDL Cholesterol (Calc) 136 (H) <130 mg/dL (calc)    Comment: For patients with diabetes plus 1 major ASCVD risk  factor, treating to a non-HDL-C goal of <100 mg/dL  (LDL-C of <70 mg/dL) is considered a therapeutic  option.    Objective  Body mass index is 28.98 kg/m. Wt Readings from Last 3 Encounters:  03/22/21 163 lb 9.6 oz (74.2 kg)  02/09/21 160 lb (72.6 kg)  09/14/20 157 lb (71.2 kg)   Temp Readings from Last 3 Encounters:  03/22/21 99 F (37.2 C) (Oral)  09/14/20 98.7 F (37.1 C) (Tympanic)  02/29/20 (!) 97.1 F (36.2 C) (Tympanic)   BP Readings from Last 3 Encounters:  03/22/21 120/79  02/09/21 130/82  09/14/20 135/90   Pulse Readings from Last 3 Encounters:  03/22/21 71  02/09/21 70   09/14/20 70    Physical Exam Vitals and nursing note reviewed.  Constitutional:      Appearance: Normal appearance. She is well-developed and well-groomed.  HENT:     Head: Normocephalic and atraumatic.  Eyes:     Conjunctiva/sclera: Conjunctivae normal.     Pupils: Pupils are equal, round, and reactive to light.  Cardiovascular:     Rate and Rhythm: Normal rate and regular rhythm.     Heart sounds: Normal heart sounds. No murmur heard. Pulmonary:     Effort: Pulmonary effort is normal.     Breath sounds: Normal breath sounds.  Abdominal:     General: Abdomen is flat. Bowel sounds are normal.     Tenderness: There is no abdominal tenderness.  Musculoskeletal:        General: No tenderness.  Skin:    General: Skin is warm and dry.  Neurological:     General: No focal deficit present.     Mental Status: She is alert and oriented to person, place, and time. Mental status is at baseline.     Cranial Nerves: Cranial nerves 2-12 are intact.     Gait: Gait  is intact.  Psychiatric:        Attention and Perception: Attention and perception normal.        Mood and Affect: Mood and affect normal.        Speech: Speech normal.        Behavior: Behavior normal. Behavior is cooperative.        Thought Content: Thought content normal.        Cognition and Memory: Cognition and memory normal.        Judgment: Judgment normal.    Assessment  Plan  Annual physical exam Flu shot utd Tdap utd  rec hep B  mmr immune  covid 2/2 had booster 3 total shots will get 3rd date back with me had covid 19 summer 2022 ? If will get further shots Consider shingrix in future  Consider prevnar   Pap 02/07/20 negative neg HPV  --> ob/gyn Dr. Sumner Boast in [place] 01/22/21 negative ordered 2023  pseudostromal angiomatous hyperplasia and calcifications f/u in 1 year ordered  -right dx mammo 09/14/18 negative but pt still having breast pain discussed breast surgery if continues vs breast MRI  will try to reorder b/l diag mammo with [place]    Colonoscopy 06/10/13 Dr. Loraine Maple GI negative repeat in 10 years scanned under media    Dermatology Dr. Kellie Moor saw 03/20/21 normal    DEXA age 6 y.o   Cont healthy diet and exercise and goal wt 130s   Anxiety and depression - Plan: ALPRAZolam (XANAX) 0.25 MG tablet prn [facility]   Gastroesophageal reflux disease without esophagitis - Rx RF omeprazole.  - Plan: omeprazole (PRILOSEC) 20 MG capsule  Neck pain - Rx RF flexeril. Pt takes sparingly.  - Plan: cyclobenzaprine (FLEXERIL) 10 MG tablet Muscle spasm - Plan: cyclobenzaprine (FLEXERIL) 10 MG tablet     Provider: Dr. Olivia Mackie McLean-Scocuzza-[facility] Medicine

## 2021-05-06 HISTORY — PX: REVISION TOTAL HIP ARTHROPLASTY: SHX766

## 2021-11-13 ENCOUNTER — Encounter: Payer: Self-pay | Admitting: Internal Medicine

## 2021-11-13 ENCOUNTER — Ambulatory Visit (INDEPENDENT_AMBULATORY_CARE_PROVIDER_SITE_OTHER): Payer: BC Managed Care – PPO | Admitting: Internal Medicine

## 2021-11-13 VITALS — BP 140/90 | HR 76 | Temp 98.2°F | Resp 14 | Ht 63.0 in | Wt 182.4 lb

## 2021-11-13 DIAGNOSIS — N6311 Unspecified lump in the right breast, upper outer quadrant: Secondary | ICD-10-CM

## 2021-11-13 DIAGNOSIS — F419 Anxiety disorder, unspecified: Secondary | ICD-10-CM

## 2021-11-13 DIAGNOSIS — R454 Irritability and anger: Secondary | ICD-10-CM | POA: Diagnosis not present

## 2021-11-13 DIAGNOSIS — N6091 Unspecified benign mammary dysplasia of right breast: Secondary | ICD-10-CM

## 2021-11-13 DIAGNOSIS — R030 Elevated blood-pressure reading, without diagnosis of hypertension: Secondary | ICD-10-CM | POA: Diagnosis not present

## 2021-11-13 DIAGNOSIS — E538 Deficiency of other specified B group vitamins: Secondary | ICD-10-CM

## 2021-11-13 DIAGNOSIS — Z6832 Body mass index (BMI) 32.0-32.9, adult: Secondary | ICD-10-CM

## 2021-11-13 DIAGNOSIS — Z1389 Encounter for screening for other disorder: Secondary | ICD-10-CM

## 2021-11-13 DIAGNOSIS — Z1329 Encounter for screening for other suspected endocrine disorder: Secondary | ICD-10-CM

## 2021-11-13 DIAGNOSIS — E785 Hyperlipidemia, unspecified: Secondary | ICD-10-CM

## 2021-11-13 MED ORDER — CITALOPRAM HYDROBROMIDE 20 MG PO TABS: 20.0000 mg | ORAL_TABLET | Freq: Every day | ORAL | 3 refills | Status: DC

## 2021-11-13 NOTE — Progress Notes (Addendum)
Chief Complaint  Patient presents with   Anxiety    Pt dealing with anxiety since having unexpected hip surgery, L on 07/2021 & R 10/2021. Has been using xanax prn but is wanting alternative she can use long term   Mass    R breast, noticed it 4 wks ago. Denies any pain in breast   F/u  1. Anxiety had been in therapy x 10 years had issues with dad undisclosed growing up GAD 7 score 16 today using xanax 0.25 qd prn does not want to increase had been on effexor in the past but made her feel like she didn't want to be in this world and zoloft by ob/gyn in the past as well  She also mentions anger but mostly takes it out on her husband and is not happy with herself and has self esteem issues has gained weight after b/l hip surgeries since 07/2021 last surgery 5 weeks ago this Thursday Dr. Harlow [facility]  2. H/o breast reduction b/l and right breast bx felt lump 9-10 oclock x 1 month ago  3. Elevated BP and no h/o BP  Had coffee this am     Review of Systems  Constitutional:  Negative for weight loss.  HENT:  Negative for hearing loss.   Eyes:  Negative for blurred vision.  Respiratory:  Negative for shortness of breath.   Cardiovascular:  Negative for chest pain.  Gastrointestinal:  Negative for abdominal pain and blood in stool.  Genitourinary:  Negative for dysuria.  Musculoskeletal:  Negative for falls and joint pain.  Skin:  Negative for rash.  Neurological:  Negative for headaches.  Psychiatric/Behavioral:  Negative for depression.    Past Medical History:  Diagnosis Date   Anxiety    Arthritis    hips, low back   Breast mass, right    Chicken pox    COVID    end 01/2021   Depression    GERD (gastroesophageal reflux disease)    Plantar fasciitis    PONV (postoperative nausea and vomiting)    Vertigo    Past Surgical History:  Procedure Laterality Date   BREAST BIOPSY Right 10/17/2015   stereo/atypical ductal hyperplasia   BREAST BIOPSY Left 11/24/2017   x marker,  PSEUDO-ANGIOMATOUS STROMAL HYPERPLASIA   BREAST EXCISIONAL BIOPSY Right 2017   atypical ductal hyperplasia excisied   BREAST SURGERY Bilateral 2010   Reduction    CARPAL TUNNEL RELEASE     Unsure of date and which laterality    CERVICAL SPINE SURGERY  2012   Discectomy and fusion of C4,5, and 6 Dr. Kendal Hymen    HIP ARTHROPLASTY Bilateral    Dr. Harlow [facility] b/l hips since 07/2021   LASIK Bilateral 2001   RADIOACTIVE SEED GUIDED EXCISIONAL BREAST BIOPSY Right 12/04/2015   Procedure: RADIOACTIVE SEED GUIDED EXCISIONAL BREAST BIOPSY;  Surgeon: Rolm Bookbinder, MD;  Location: [facility];  Service: General;  Laterality: Right;   REDUCTION MAMMAPLASTY Bilateral 2010   SHOULDER SURGERY Left 1982   bone spur removed/ bone separation repair   thumb joint replacement     L thumb Dr. Amedeo Plenty    TONSILLECTOMY AND ADENOIDECTOMY  1967   Family History  Problem Relation Age of Onset   Arthritis Mother    Hypertension Mother    Kidney disease Mother    Alcohol abuse Father    Arthritis Father    Lung cancer Father    Hypertension Father    Cancer Father  lung smoker    Arthritis Maternal Aunt    Cancer Maternal Aunt        melanoma   Arthritis Maternal Uncle    Lung cancer Maternal Uncle    Cancer Maternal Uncle        bladder not smoker    Colon cancer Paternal Uncle    Cancer Paternal Uncle        colon cancer older age 82s died in 38s   Lung cancer Maternal Grandmother    Hypertension Maternal Grandmother    Cancer Maternal Grandmother        lung not smoker    Breast cancer Cousin    Rectal cancer Cousin        48 had rectal cancer    Breast cancer Cousin 41   Alcohol abuse Brother    Hypertension Brother    Social History   Socioeconomic History   Marital status: Married    Spouse name: Not on file   Number of children: Not on file   Years of education: Not on file   Highest education level: Not on file  Occupational History   Not on file   Tobacco Use   Smoking status: Former    Types: Cigarettes   Smokeless tobacco: Never   Tobacco comments:    socially in college, 1 pk lasted 1 month  Vaping Use   Vaping Use: Never used  Substance and Sexual Activity   Alcohol use: Yes    Alcohol/week: 0.0 - 1.0 standard drinks of alcohol    Comment: social   Drug use: No   Sexual activity: Not Currently    Birth control/protection: Post-menopausal  Other Topics Concern   Not on file  Social History Narrative   Married   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   Ascorbic Acid (VITAMIN C) 1000 MG tablet Take 1,000 mg by mouth daily.   Biotin 10000 MCG TABS    Black Pepper-Turmeric (TURMERIC CURCUMIN) 09-998 MG CAPS    Calcium-Magnesium 70-83 MG CAPS Take by mouth in the morning and at bedtime. Calm supplement   Cholecalciferol (VITAMIN D) 125 MCG (5000 UT) CAPS    citalopram (CELEXA) 20 MG tablet Take 1 tablet (20 mg total) by mouth daily.   cyclobenzaprine (FLEXERIL) 10 MG tablet TAKE 1 TABLET (10 MG TOTAL) BY MOUTH AT BEDTIME prn   Multiple Vitamins-Minerals (WOMENS MULTIVITAMIN PLUS PO) Take by mouth once.   Omega-3 Fatty Acids (FISH OIL) 1000 MG CAPS Take by mouth.   omeprazole (PRILOSEC) 20 MG capsule TAKE 1 CAPSULE BY MOUTH ONCE DAILY 30 MINUTES BEFORE FOOD   pyridOXINE (VITAMIN B-6) 100 MG tablet Take 100 mg by mouth daily.    Allergies  Allergen Reactions   Effexor Xr [Venlafaxine Hcl]     Worsening anxiety, thoughts of wanting to leave this earth tapered  off end 12/2020/01/2021   No results found for this or any previous visit (from the past  2160 hour(s)). Objective  Body mass index is 32.31 kg/m. Wt Readings from Last 3 Encounters:  11/13/21 182 lb 6.4 oz (82.7 kg)  03/22/21 163 lb 9.6 oz (74.2 kg)  02/09/21 160 lb (72.6 kg)   Temp Readings from Last 3 Encounters:  11/13/21 98.2 F (36.8 C) (Oral)  03/22/21 99 F (37.2 C) (Oral)  09/14/20 98.7 F (37.1 C) (Tympanic)   BP Readings from Last 3 Encounters:  11/13/21 (!) 140/104  03/22/21 120/79  02/09/21 130/82   Pulse Readings from Last 3 Encounters:  11/13/21 76  03/22/21 71  02/09/21 70    Physical Exam Vitals and nursing note reviewed.  Constitutional:      Appearance: Normal appearance. She is well-developed and well-groomed.  HENT:     Head: Normocephalic and atraumatic.  Eyes:     Conjunctiva/sclera: Conjunctivae normal.     Pupils: Pupils are equal, round, and reactive to light.  Cardiovascular:     Rate and Rhythm: Normal rate and regular rhythm.     Heart sounds: Normal heart sounds. No murmur heard. Pulmonary:     Effort: Pulmonary effort is normal.     Breath sounds: Normal breath sounds.  Chest:    Abdominal:     General: Abdomen is flat. Bowel sounds are normal.     Tenderness: There is no abdominal tenderness.  Musculoskeletal:        General: No tenderness.  Skin:    General: Skin is warm and dry.  Neurological:     General: No focal deficit present.     Mental Status: She is alert and oriented to person, place, and time. Mental status is at baseline.     Cranial Nerves: Cranial nerves 2-12 are intact.     Motor: Motor function is intact.     Coordination: Coordination is intact.     Gait: Gait is intact.  Psychiatric:        Attention and Perception: Attention and perception normal.        Mood and Affect: Mood and affect normal.        Speech: Speech normal.        Behavior: Behavior normal. Behavior is cooperative.        Thought  Content: Thought content normal.        Cognition and Memory: Cognition and memory normal.        Judgment: Judgment normal.     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 Rolm Bookbinder    RECOMMENDATION: 1. Recommend annual screening mammogram as well as supplemental screening with annual breast MRI given history of atypical ductal hyperplasia. The American Cancer Society recommends annual MRI and mammography in patients with an estimated lifetime risk of developing breast cancer greater than 20 - 25%, or who are known or suspected to be positive for the breast cancer gene.   I have discussed  the findings and recommendations with the patient. If applicable, a reminder letter will be sent to the patient regarding the next appointment.   BI-RADS CATEGORY  2: Benign.    HM Flu shot utd Tdap utd  rec hep B  mmr immune  covid 2/2 had booster 3 total shots will get 3rd date back with me had covid 19 summer 2022 ? If will get further shots Consider shingrix in future  Consider prevnar   Pap 02/07/20 negative neg HPV  --> ob/gyn Dr. Sumner Boast in [place] 01/22/21 negative ordered 2023  pseudostromal angiomatous hyperplasia and calcifications f/u in 1 year ordered  -right dx mammo 09/14/18 negative but pt still having breast pain discussed breast surgery if continues vs breast MRI will try to reorder  b/l diag mammo with [place]    Colonoscopy 06/10/13 Dr. Loraine Maple GI negative repeat in 10 years scanned under media    Dermatology Dr. Kellie Moor saw 03/20/21 normal    DEXA age 40 y.o    Cont healthy diet and exercise and goal wt 130s   Provider: Dr. Olivia Mackie [facility]-Scoc[facility]-Internal Medicine

## 2021-11-13 NOTE — Patient Instructions (Addendum)
Monitor BP at home goal is <130/<80 if not this let me know   Call [facility] to schedule mammogram  [phone] [address]   Therapy options [facility] [place]   [facility]  [facility] as given the info before for both  [facility]  [address]  [place] [address] [phone]    [facility]  [address]  [place] [address]  [phone]   Consider shingrix vaccines  Zoster Vaccine, Recombinant injection What is this medication? ZOSTER VACCINE (ZOS ter vak SEEN) is a vaccine used to reduce the risk of getting shingles. This vaccine is not used to treat shingles or nerve pain from shingles. This medicine may be used for other purposes; ask your health care provider or pharmacist if you have questions. COMMON BRAND NAME(S): [facility] What should I tell my care team before I take this medication? They need to know if you have any of these conditions: cancer immune system problems an unusual or allergic reaction to Zoster vaccine, other medications, foods, dyes, or preservatives pregnant or trying to get pregnant breast-feeding How should I use this medication? This vaccine is injected into a muscle. It is given by a health care provider. A copy of Vaccine Information Statements will be given before each vaccination. Be sure to read this information carefully each time. This sheet may change often. Talk to your health care provider about the use of this vaccine in children. This vaccine is not approved for use in children. Overdosage: If you think you have taken too much of this medicine contact a poison control center or emergency room at once. NOTE: This medicine is only for you. Do not share this medicine with others. What if I miss a dose? Keep appointments for follow-up (booster) doses. It is important not to miss your dose. Call your health care provider if  you are unable to keep an appointment. What may interact with this medication? medicines that suppress your immune system medicines to treat cancer steroid medicines like prednisone or cortisone This list may not describe all possible interactions. Give your health care provider a list of all the medicines, herbs, non-prescription drugs, or dietary supplements you use. Also tell them if you smoke, drink alcohol, or use illegal drugs. Some items may interact with your medicine. What should I watch for while using this medication? Visit your health care provider regularly. This vaccine, like all vaccines, may not fully protect everyone. What side effects may I notice from receiving this medication? Side effects that you should report to your doctor or health care professional as soon as possible: allergic reactions (skin rash, itching or hives; swelling of the face, lips, or tongue) trouble breathing Side effects that usually do not require medical attention (report these to your doctor or health care professional if they continue or are bothersome): chills headache fever nausea pain, redness, or irritation at site where injected tiredness vomiting This list may not describe all possible side effects. Call your doctor for medical advice about side effects. You may report side effects to FDA at 1-800-FDA-1088. Where should I keep my medication? This vaccine is only given by a health care provider. It will not be stored at home. NOTE: This sheet is a summary. It may not cover all possible information. If you have questions about this medicine, talk to your doctor, pharmacist, or health care provider.  2023 Elsevier/Gold Standard (2021-03-23 00:00:00)

## 2021-11-14 ENCOUNTER — Ambulatory Visit: Payer: BC Managed Care – PPO | Admitting: Internal Medicine

## 2021-11-28 ENCOUNTER — Ambulatory Visit
Admission: RE | Admit: 2021-11-28 | Discharge: 2021-11-28 | Disposition: A | Discharge status: Home / Self Care | Payer: BC Managed Care – PPO | Source: Ambulatory Visit | Attending: Internal Medicine | Admitting: Internal Medicine

## 2021-11-28 DIAGNOSIS — N6311 Unspecified lump in the right breast, upper outer quadrant: Secondary | ICD-10-CM | POA: Diagnosis present

## 2021-11-29 ENCOUNTER — Encounter: Payer: Self-pay | Admitting: Internal Medicine

## 2021-12-10 DIAGNOSIS — N6311 Unspecified lump in the right breast, upper outer quadrant: Secondary | ICD-10-CM | POA: Insufficient documentation

## 2021-12-10 NOTE — Addendum Note (Signed)
Addended by: Quentin Ore on: 12/10/2021 12:14 AM   Modules accepted: Orders

## 2021-12-17 ENCOUNTER — Other Ambulatory Visit (INDEPENDENT_AMBULATORY_CARE_PROVIDER_SITE_OTHER): Payer: BC Managed Care – PPO

## 2021-12-17 DIAGNOSIS — F419 Anxiety disorder, unspecified: Secondary | ICD-10-CM

## 2021-12-17 DIAGNOSIS — Z1329 Encounter for screening for other suspected endocrine disorder: Secondary | ICD-10-CM

## 2021-12-17 DIAGNOSIS — E785 Hyperlipidemia, unspecified: Secondary | ICD-10-CM | POA: Diagnosis not present

## 2021-12-17 DIAGNOSIS — R030 Elevated blood-pressure reading, without diagnosis of hypertension: Secondary | ICD-10-CM

## 2021-12-17 DIAGNOSIS — Z1389 Encounter for screening for other disorder: Secondary | ICD-10-CM

## 2021-12-17 DIAGNOSIS — E538 Deficiency of other specified B group vitamins: Secondary | ICD-10-CM

## 2021-12-17 LAB — COMPREHENSIVE METABOLIC PANEL
ALT: 16 U/L (ref 0–35)
AST: 19 U/L (ref 0–37)
Albumin: 4.3 g/dL (ref 3.5–5.2)
Alkaline Phosphatase: 53 U/L (ref 39–117)
BUN: 13 mg/dL (ref 6–23)
CO2: 28 mEq/L (ref 19–32)
Calcium: 9.3 mg/dL (ref 8.4–10.5)
Chloride: 103 mEq/L (ref 96–112)
Creatinine, Ser: 0.72 mg/dL (ref 0.40–1.20)
GFR: 91.33 mL/min (ref >60.00)
Glucose, Bld: 88 mg/dL (ref 70–99)
Potassium: 4.3 mEq/L (ref 3.5–5.1)
Sodium: 140 mEq/L (ref 135–145)
Total Bilirubin: 0.4 mg/dL (ref 0.2–1.2)
Total Protein: 6.8 g/dL (ref 6.0–8.3)

## 2021-12-17 LAB — CBC WITH DIFFERENTIAL/PLATELET
Basophils Absolute: 0.1 10*3/uL (ref 0.0–0.1)
Basophils Relative: 1.8 % (ref 0.0–3.0)
Eosinophils Absolute: 0.2 10*3/uL (ref 0.0–0.7)
Eosinophils Relative: 3.9 % (ref 0.0–5.0)
HCT: 37.1 % (ref 36.0–46.0)
Hemoglobin: 12.1 g/dL (ref 12.0–15.0)
Lymphocytes Relative: 43.4 % (ref 12.0–46.0)
Lymphs Abs: 1.8 10*3/uL (ref 0.7–4.0)
MCHC: 32.5 g/dL (ref 30.0–36.0)
MCV: 88.6 fl (ref 78.0–100.0)
Monocytes Absolute: 0.4 10*3/uL (ref 0.1–1.0)
Monocytes Relative: 9.7 % (ref 3.0–12.0)
Neutro Abs: 1.7 10*3/uL (ref 1.4–7.7)
Neutrophils Relative %: 41.2 % — ABNORMAL LOW (ref 43.0–77.0)
Platelets: 235 10*3/uL (ref 150.0–400.0)
RBC: 4.19 Mil/uL (ref 3.87–5.11)
RDW: 15.1 % (ref 11.5–15.5)
WBC: 4.1 10*3/uL (ref 4.0–10.5)

## 2021-12-17 LAB — LIPID PANEL
Cholesterol: 224 mg/dL — ABNORMAL HIGH (ref 0–200)
HDL: 79.6 mg/dL (ref >39.00)
LDL Cholesterol: 127 mg/dL — ABNORMAL HIGH (ref 0–99)
NonHDL: 144.47
Total CHOL/HDL Ratio: 3
Triglycerides: 85 mg/dL (ref 0.0–149.0)
VLDL: 17 mg/dL (ref 0.0–40.0)

## 2021-12-17 LAB — VITAMIN B12: Vitamin B-12: 390 pg/mL (ref 211–911)

## 2021-12-17 LAB — TSH: TSH: 0.94 u[IU]/mL (ref 0.35–5.50)

## 2021-12-18 LAB — URINALYSIS, ROUTINE W REFLEX MICROSCOPIC
Bilirubin Urine: NEGATIVE
Glucose, UA: NEGATIVE
Hgb urine dipstick: NEGATIVE
Ketones, ur: NEGATIVE
Leukocytes,Ua: NEGATIVE
Nitrite: NEGATIVE
Protein, ur: NEGATIVE
Specific Gravity, Urine: 1.024 (ref 1.001–1.035)
pH: 7 (ref 5.0–8.0)

## 2021-12-19 ENCOUNTER — Ambulatory Visit
Admission: RE | Admit: 2021-12-19 | Discharge: 2021-12-19 | Disposition: A | Discharge status: Home / Self Care | Payer: BC Managed Care – PPO | Source: Ambulatory Visit | Attending: Internal Medicine | Admitting: Internal Medicine

## 2021-12-19 DIAGNOSIS — N6091 Unspecified benign mammary dysplasia of right breast: Secondary | ICD-10-CM

## 2021-12-19 DIAGNOSIS — N6311 Unspecified lump in the right breast, upper outer quadrant: Secondary | ICD-10-CM

## 2021-12-19 MED ORDER — GADOBUTROL 1 MMOL/ML IV SOLN
8.0000 mL | Freq: Once | INTRAVENOUS | Status: AC | PRN
  Administered 2021-12-19: 8 mL via INTRAVENOUS

## 2022-01-31 ENCOUNTER — Inpatient Hospital Stay: Payer: BC Managed Care – PPO | Attending: Oncology | Admitting: Licensed Clinical Social Worker

## 2022-01-31 ENCOUNTER — Inpatient Hospital Stay: Payer: BC Managed Care – PPO

## 2022-01-31 ENCOUNTER — Encounter: Payer: Self-pay | Admitting: Licensed Clinical Social Worker

## 2022-01-31 DIAGNOSIS — Z806 Family history of leukemia: Secondary | ICD-10-CM

## 2022-01-31 DIAGNOSIS — Z803 Family history of malignant neoplasm of breast: Secondary | ICD-10-CM | POA: Diagnosis not present

## 2022-01-31 DIAGNOSIS — Z8052 Family history of malignant neoplasm of bladder: Secondary | ICD-10-CM | POA: Diagnosis not present

## 2022-01-31 DIAGNOSIS — Z800 Family history of malignant neoplasm of digestive organs: Secondary | ICD-10-CM | POA: Diagnosis not present

## 2022-01-31 DIAGNOSIS — Z801 Family history of malignant neoplasm of trachea, bronchus and lung: Secondary | ICD-10-CM | POA: Diagnosis not present

## 2022-01-31 NOTE — Progress Notes (Signed)
REFERRING PROVIDER: Emelia Loron, MD [address] [place],  [place] [address]  PRIMARY PROVIDER:  McLean-Scocuzza, Pasty Spillers, MD  PRIMARY REASON FOR VISIT:  1. Family history of colon cancer   2. Family history of Lynch syndrome   3. Family history of breast cancer   4. Family history of lung cancer   5. Family history of bladder cancer   6. Family history of leukemia      HISTORY OF PRESENT ILLNESS:   Yvonne Moore, a 61 y.o. female, was seen for a [facility] cancer genetics consultation at the request of Dr. Dwain Sarna due to a family history of cancer/Lynch syndrome.  Yvonne Moore presents to clinic today to discuss the possibility of a hereditary predisposition to cancer, genetic testing, and to further clarify her future cancer risks, as well as potential cancer risks for family members.    CANCER HISTORY:  Yvonne Moore is a 61 y.o. female with no personal history of cancer.    RISK FACTORS:  Menarche was at age 76.  First live birth at age 66.  Ovaries intact: yes.  Hysterectomy: no.  Menopausal status: postmenopausal.  Colonoscopy: yes; normal. Mammogram within the last year: yes. Number of breast biopsies: 1- patient had ADH in 2017.  Past Medical History:  Diagnosis Date   Anxiety    Arthritis    hips, low back   Breast mass, right    Chicken pox    COVID    end 01/2021   Depression    GERD (gastroesophageal reflux disease)    Plantar fasciitis    PONV (postoperative nausea and vomiting)    Vertigo     Past Surgical History:  Procedure Laterality Date   BREAST BIOPSY Right 10/17/2015   stereo/atypical ductal hyperplasia   BREAST BIOPSY Left 11/24/2017   x marker, PSEUDO-ANGIOMATOUS STROMAL HYPERPLASIA   BREAST EXCISIONAL BIOPSY Right 2017   atypical ductal hyperplasia excisied   BREAST SURGERY Bilateral 2010   Reduction    CARPAL TUNNEL RELEASE     Unsure of date and which laterality    CERVICAL SPINE SURGERY  2012   Discectomy and  fusion of C4,5, and 6 Dr. Gretta Began    HIP ARTHROPLASTY Bilateral    Dr. Odis [facility] b[facility] since 07/2021   LASIK Bilateral 2001   RADIOACTIVE SEED GUIDED EXCISIONAL BREAST BIOPSY Right 12/04/2015   Procedure: RADIOACTIVE SEED GUIDED EXCISIONAL BREAST BIOPSY;  Surgeon: Emelia Loron, MD;  Location: [facility];  Service: General;  Laterality: Right;   REDUCTION MAMMAPLASTY Bilateral 2010   SHOULDER SURGERY Left 1982   bone spur removed/ bone separation repair   thumb joint replacement     L thumb Dr. Amanda Pea    TONSILLECTOMY AND ADENOIDECTOMY  1967    FAMILY HISTORY:  We obtained a detailed, 4-generation family history.  Significant diagnoses are listed below: Family History  Problem Relation Age of Onset   Arthritis Mother    Hypertension Mother    Kidney disease Mother    Alcohol abuse Father    Arthritis Father    Lung cancer Father 71   Hypertension Father    Alcohol abuse Brother    Hypertension Brother    Arthritis Maternal Aunt    Arthritis Maternal Uncle    Lung cancer Maternal Uncle    Bladder Cancer Maternal Uncle    Colon cancer Paternal Uncle        x5 "Lynch+"   Leukemia Paternal Uncle  Lung cancer Paternal Uncle    Lung cancer Maternal Grandmother    Hypertension Maternal Grandmother    Rectal cancer Cousin        vs anal   Breast cancer Cousin        dx 38s   Colon cancer Cousin        dx 26s; "Lynch +"   Melanoma Cousin    Bladder Cancer Cousin    Breast cancer Cousin        dx 19s   Yvonne Moore has 1 brother, 33. She has 1 son, 92.   Yvonne Moore mother passed at 42. Patient had 1 uncle with bladder cancer, 1 uncle with lung cancer, 1 cousin with melanoma, 1 cousin with colon vs anal cancer, and 1 cousin with bladder cancer. Maternal grandmother had lung cancer and her sister had colon cancer.  Yvonne Moore father had lung cancer and passed at 97. Patient had 1 uncle who had colon cancer 5 times and reportedly had Lynch  syndrome. He was initially diagnosed in his 67s and is passed away. He had 2 daughters, one has had breast and colon cancer in her 73s and is Lynch positive (unknown gene, patient will try to get copy of report), and his other daughter also had breast in her 34s but she tested negative for Lynch. Patient had another maternal uncle that had leukemia, another uncle that had lung cancer, and an uncle with skin cancer.  Yvonne Moore is aware of previous family history of genetic testing for hereditary cancer risks. There is no reported Ashkenazi Jewish ancestry. There is no known consanguinity.    GENETIC COUNSELING ASSESSMENT: Yvonne Moore is a 61 y.o. female with a family history of Lynch syndrome. We, therefore, discussed and recommended the following at today's visit.   DISCUSSION: We discussed that approximately 10% of cancer is hereditary. Most cases of hereditary colon cancer are associated with Lynch syndrome genes. We discussed Lynch syndrome in detail and recommended asking her cousin for a copy of her report. There are other genes associated with hereditary  cancer as well. Cancers and risks are gene specific. We discussed that testing is beneficial for several reasons including knowing about cancer risks, identifying potential screening and risk-reduction options that may be appropriate, and to understand if other family members could be at risk for cancer and allow them to undergo genetic testing.   We reviewed the characteristics, features and inheritance patterns of hereditary cancer syndromes. We also discussed genetic testing, including the appropriate family members to test, the process of testing, insurance coverage and turn-around-time for results. We discussed the implications of a negative, positive and/or variant of uncertain significant result. We recommended Yvonne Moore pursue genetic testing for the [facility] Multi-Cancer+RNA gene panel.   Based on Yvonne Moore family history of  cancer, she meets medical criteria for genetic testing. Despite that she meets criteria, she may still have an out of pocket cost. We discussed that if her out of pocket cost for testing is over $100, the laboratory will call and confirm whether she wants to proceed with testing.  If the out of pocket cost of testing is less than $100 she will be billed by the genetic testing laboratory.   PLAN: After considering the risks, benefits, and limitations, Yvonne Moore provided informed consent to pursue genetic testing and the blood sample was sent to [facility] for analysis of the Multi-Cancer+RNA panel. Results should be available within approximately 2-3 weeks' time, at which point  they will be disclosed by telephone to Yvonne Moore, as will any additional recommendations warranted by these results. Yvonne Moore will receive a summary of her genetic counseling visit and a copy of her results once available. This information will also be available in Epic.   Yvonne Moore's questions were answered to her satisfaction today. Our contact information was provided should additional questions or concerns arise. Thank you for the referral and allowing [place] to share in the care of your patient.   Lacy Duverney, MS, [facility] Genetic Counselor [email or web address] Phone: [phone]  The patient was seen for a total of 25 minutes in face-to-face genetic counseling.  Dr. Orlie Dakin was available for discussion regarding this case.   _______________________________________________________________________ For Office Staff:  Number of people involved in session: 1 Was an Intern/ student involved with case: no

## 2022-02-11 NOTE — Progress Notes (Signed)
61 y.o. G20P1001 Married White or Caucasian Not Hispanic or Latino female here for annual exam.  No vaginal bleeding. Not sexually active.   No bowel or bladder c/o.    Old records [facility] Pap 09/23/13 negative with negative hpv Pap 10/28/16 negative with +HPV Pap 12/18/17 negative, negative HPV 6/17 breast lumpectomy with atypical hyperplasia.  Normal colonoscopy in 2015, f/u in 10 years.    Pap from 02/07/20 negative, negative hpv. She needs a f/u pap in 3 years.   She has had 2 hip replacements in the last year.   She recently had genetic testing for lynch syndrome, awaiting results.   Patient's last menstrual period was 06/08/2016 (exact date).          Sexually active: No.  The current method of family planning is post menopausal status.    Exercising: No.  The patient does not participate in regular exercise at present. Smoker:  no  Health Maintenance: Pap:  02-07-20 normal History of abnormal Pap:  yes Had colpo for pos HPV MMG:  11/28/21 density C Bi-rads 2 benign [place]: 11/28/21 Bi-rads 2 benign MR 12/19/21 also Bi-rads 2 benign  BMD:   n/a Colonoscopy: 2014 normal  TDaP:  12/03/17 Gardasil: n/a   reports that she has quit smoking. Her smoking use included cigarettes. She has never used smokeless tobacco. She reports current alcohol use. She reports that she does not use drugs. ETOH 1 drink a day. 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 Son is local, married, first child due next month (boy). Doreene Adas has two kids.   Past Medical History:  Diagnosis Date   Anxiety    Arthritis    hips, low back   Breast mass, right    Chicken pox    COVID    end 01/2021   Depression    GERD (gastroesophageal reflux disease)    Plantar fasciitis    PONV (postoperative nausea and vomiting)    Vertigo     Past Surgical History:  Procedure Laterality Date   BREAST BIOPSY Right 10/17/2015   stereo/atypical ductal hyperplasia   BREAST BIOPSY Left 11/24/2017   x marker,  PSEUDO-ANGIOMATOUS STROMAL HYPERPLASIA   BREAST EXCISIONAL BIOPSY Right 2017   atypical ductal hyperplasia excisied   BREAST SURGERY Bilateral 2010   Reduction    CARPAL TUNNEL RELEASE     Unsure of date and which laterality    CERVICAL SPINE SURGERY  2012   Discectomy and fusion of C4,5, and 6 Dr. Gretta Began    HIP ARTHROPLASTY Bilateral    Dr. Odis [facility] b[facility]l hips since 07/2021   LASIK Bilateral 2001   RADIOACTIVE SEED GUIDED EXCISIONAL BREAST BIOPSY Right 12/04/2015   Procedure: RADIOACTIVE SEED GUIDED EXCISIONAL BREAST BIOPSY;  Surgeon: Emelia Loron, MD;  Location: [facility];  Service: General;  Laterality: Right;   REDUCTION MAMMAPLASTY Bilateral 2010   REVISION TOTAL HIP ARTHROPLASTY Bilateral 2023   SHOULDER SURGERY Left 1982   bone spur removed/ bone separation repair   thumb joint replacement     L thumb Dr. Amanda Pea    TONSILLECTOMY AND ADENOIDECTOMY  1967    Current Outpatient Medications  Medication Sig Dispense Refill   ALPRAZolam (XANAX) 0.25 MG tablet Take 1 tablet (0.25 mg total) by mouth daily as needed for anxiety. 30 tablet 5   amLODipine (NORVASC) 2.5 MG tablet Take 1 tablet (2.5 mg total) by mouth daily. 90 tablet 3   Ascorbic Acid (VITAMIN C) 1000 MG tablet Take 1,000  mg by mouth daily.     Biotin 10000 MCG TABS      Black Pepper-Turmeric (TURMERIC CURCUMIN) 09-998 MG CAPS      Calcium-Magnesium 70-83 MG CAPS Take by mouth in the morning and at bedtime. Calm supplement     Cholecalciferol (VITAMIN D) 125 MCG (5000 UT) CAPS      citalopram (CELEXA) 20 MG tablet Take 1 tablet (20 mg total) by mouth daily. 90 tablet 3   cyclobenzaprine (FLEXERIL) 10 MG tablet TAKE 1 TABLET (10 MG TOTAL) BY MOUTH AT BEDTIME prn 30 tablet 5   MELOXICAM PO      Multiple Vitamins-Minerals (WOMENS MULTIVITAMIN PLUS PO) Take by mouth once.     Omega-3 Fatty Acids (FISH OIL) 1000 MG CAPS Take by mouth.     omeprazole (PRILOSEC) 20 MG capsule TAKE 1 CAPSULE BY  MOUTH ONCE DAILY 30 MINUTES BEFORE FOOD 90 capsule 3   pyridOXINE (VITAMIN B-6) 100 MG tablet Take 100 mg by mouth daily.      No current facility-administered medications for this visit.    Family History  Problem Relation Age of Onset   Arthritis Mother    Hypertension Mother    Kidney disease Mother    Alcohol abuse Father    Arthritis Father    Lung cancer Father 66   Hypertension Father    Alcohol abuse Brother    Hypertension Brother    Colon polyps Brother    Lung cancer Maternal Grandmother    Hypertension Maternal Grandmother    Arthritis Maternal Aunt    Arthritis Maternal Uncle    Lung cancer Maternal Uncle    Bladder Cancer Maternal Uncle    Colon cancer Paternal Uncle        x5 "Lynch+"   Leukemia Paternal Uncle    Lung cancer Paternal Uncle    Rectal cancer Cousin        vs anal   Breast cancer Cousin        dx 57s   Colon cancer Cousin        dx 73s; "Lynch +"   Melanoma Cousin    Bladder Cancer Cousin    Breast cancer Cousin        dx 40s   Other Other        lynch    Review of Systems  All other systems reviewed and are negative.   Exam:   BP 138/82   Pulse 77   Ht 5\' 3"  (1.6 m)   Wt 182 lb (82.6 kg)   LMP 06/08/2016 (Exact Date) Comment: spotted in May for 2 days  SpO2 98%   BMI 32.24 kg/m   Weight change: @WEIGHTCHANGE @ Height:   Height: 5\' 3"  (160 cm)  Ht Readings from Last 3 Encounters:  02/14/22 5\' 3"  (1.6 m)  02/13/22 5\' 3"  (1.6 m)  11/13/21 5\' 3"  (1.6 m)    General appearance: alert, cooperative and appears stated age Head: Normocephalic, without obvious abnormality, atraumatic Neck: no adenopathy, supple, symmetrical, trachea midline and thyroid normal to inspection and palpation Lungs: clear to auscultation bilaterally Cardiovascular: regular rate and rhythm Breasts: normal appearance, no masses or tenderness Abdomen: soft, non-tender; non distended,  no masses,  no organomegaly Extremities: extremities normal, atraumatic,  no cyanosis or edema Skin: Skin color, texture, turgor normal. No rashes or lesions Lymph nodes: Cervical, supraclavicular, and axillary nodes normal. No abnormal inguinal nodes palpated Neurologic: Grossly normal   Pelvic: External genitalia:  no lesions  Urethra:  normal appearing urethra with no masses, tenderness or lesions              Bartholins and Skenes: normal                 Vagina: normal appearing vagina with normal color and discharge, no lesions              Cervix: no lesions               Bimanual Exam:  Uterus:  normal size, contour, position, consistency, mobility, non-tender              Adnexa: no mass, fullness, tenderness               Rectovaginal: Confirms               Anus:  normal sphincter tone, no lesions  Carolynn Serve, CMA chaperoned for the exam.  1. Well woman exam Discussed breast self exam Discussed calcium and vit D intake Pap next year Mammogram UTD Colonoscopy utd Labs with primary

## 2022-02-13 ENCOUNTER — Ambulatory Visit (INDEPENDENT_AMBULATORY_CARE_PROVIDER_SITE_OTHER): Payer: BC Managed Care – PPO | Admitting: Internal Medicine

## 2022-02-13 ENCOUNTER — Encounter: Payer: Self-pay | Admitting: Internal Medicine

## 2022-02-13 VITALS — BP 136/80 | HR 65 | Temp 98.4°F | Ht 63.0 in | Wt 182.2 lb

## 2022-02-13 DIAGNOSIS — D709 Neutropenia, unspecified: Secondary | ICD-10-CM

## 2022-02-13 DIAGNOSIS — F419 Anxiety disorder, unspecified: Secondary | ICD-10-CM | POA: Diagnosis not present

## 2022-02-13 DIAGNOSIS — E785 Hyperlipidemia, unspecified: Secondary | ICD-10-CM

## 2022-02-13 DIAGNOSIS — R454 Irritability and anger: Secondary | ICD-10-CM

## 2022-02-13 DIAGNOSIS — I10 Essential (primary) hypertension: Secondary | ICD-10-CM | POA: Diagnosis not present

## 2022-02-13 DIAGNOSIS — Z23 Encounter for immunization: Secondary | ICD-10-CM

## 2022-02-13 DIAGNOSIS — Z800 Family history of malignant neoplasm of digestive organs: Secondary | ICD-10-CM | POA: Insufficient documentation

## 2022-02-13 DIAGNOSIS — Z6832 Body mass index (BMI) 32.0-32.9, adult: Secondary | ICD-10-CM | POA: Diagnosis not present

## 2022-02-13 DIAGNOSIS — Z1211 Encounter for screening for malignant neoplasm of colon: Secondary | ICD-10-CM

## 2022-02-13 DIAGNOSIS — F32A Depression, unspecified: Secondary | ICD-10-CM

## 2022-02-13 DIAGNOSIS — K219 Gastro-esophageal reflux disease without esophagitis: Secondary | ICD-10-CM

## 2022-02-13 LAB — CBC WITH DIFFERENTIAL/PLATELET
Basophils Absolute: 0.1 10*3/uL (ref 0.0–0.1)
Basophils Relative: 0.9 % (ref 0.0–3.0)
Eosinophils Absolute: 0.3 10*3/uL (ref 0.0–0.7)
Eosinophils Relative: 5.4 % — ABNORMAL HIGH (ref 0.0–5.0)
HCT: 38.3 % (ref 36.0–46.0)
Hemoglobin: 12.9 g/dL (ref 12.0–15.0)
Lymphocytes Relative: 50.3 % — ABNORMAL HIGH (ref 12.0–46.0)
Lymphs Abs: 2.7 10*3/uL (ref 0.7–4.0)
MCHC: 33.6 g/dL (ref 30.0–36.0)
MCV: 88.6 fl (ref 78.0–100.0)
Monocytes Absolute: 0.4 10*3/uL (ref 0.1–1.0)
Monocytes Relative: 8 % (ref 3.0–12.0)
Neutro Abs: 1.9 10*3/uL (ref 1.4–7.7)
Neutrophils Relative %: 35.4 % — ABNORMAL LOW (ref 43.0–77.0)
Platelets: 194 10*3/uL (ref 150.0–400.0)
RBC: 4.32 Mil/uL (ref 3.87–5.11)
RDW: 14.5 % (ref 11.5–15.5)
WBC: 5.3 10*3/uL (ref 4.0–10.5)

## 2022-02-13 MED ORDER — AMLODIPINE BESYLATE 2.5 MG PO TABS: 2.5000 mg | ORAL_TABLET | Freq: Every day | ORAL | 3 refills | Status: DC

## 2022-02-13 MED ORDER — ALPRAZOLAM 0.25 MG PO TABS: 0.2500 mg | ORAL_TABLET | Freq: Every day | ORAL | 5 refills | Status: DC | PRN

## 2022-02-13 MED ORDER — CITALOPRAM HYDROBROMIDE 20 MG PO TABS: 20.0000 mg | ORAL_TABLET | Freq: Every day | ORAL | 3 refills | Status: DC

## 2022-02-13 MED ORDER — OMEPRAZOLE 20 MG PO CPDR: DELAYED_RELEASE_CAPSULE | ORAL | 3 refills | Status: DC

## 2022-02-13 NOTE — Progress Notes (Signed)
Chief Complaint  Patient presents with   Follow-up    3 month f/u   F/u  1. Htn elevated bp at home mostly sl elevated 120s-140s/80s-90s  Hld, obesity as well trouble losing wt will refer wt loss clinic 2. Wants refills chronic meds  3. Fh lynch syndrome and brother polyps seeing genetics clinic 01/2022 and also will refer back for colonoscopy in 2025 will be 10 years but needed before     Review of Systems  Constitutional:  Negative for weight loss.  HENT:  Negative for hearing loss.   Eyes:  Negative for blurred vision.  Respiratory:  Negative for shortness of breath.   Cardiovascular:  Negative for chest pain.  Gastrointestinal:  Negative for abdominal pain and blood in stool.  Genitourinary:  Negative for dysuria.  Musculoskeletal:  Negative for falls and joint pain.  Skin:  Negative for rash.  Neurological:  Negative for headaches.  Psychiatric/Behavioral:  Negative for depression.    Past Medical History:  Diagnosis Date   Anxiety    Arthritis    hips, low back   Breast mass, right    Chicken pox    COVID    end 01/2021   Depression    GERD (gastroesophageal reflux disease)    Plantar fasciitis    PONV (postoperative nausea and vomiting)    Vertigo    Past Surgical History:  Procedure Laterality Date   BREAST BIOPSY Right 10/17/2015   stereo/atypical ductal hyperplasia   BREAST BIOPSY Left 11/24/2017   x marker, PSEUDO-ANGIOMATOUS STROMAL HYPERPLASIA   BREAST EXCISIONAL BIOPSY Right 2017   atypical ductal hyperplasia excisied   BREAST SURGERY Bilateral 2010   Reduction    CARPAL TUNNEL RELEASE     Unsure of date and which laterality    CERVICAL SPINE SURGERY  2012   Discectomy and fusion of C4,5, and 6 Dr. Kendal Hymen    HIP ARTHROPLASTY Bilateral    Dr. Harlow M[facility] b/l hips since 07/2021   LASIK Bilateral 2001   RADIOACTIVE SEED GUIDED EXCISIONAL BREAST BIOPSY Right 12/04/2015   Procedure: RADIOACTIVE SEED GUIDED EXCISIONAL BREAST BIOPSY;  Surgeon:  Rolm Bookbinder, MD;  Location: [facility];  Service: General;  Laterality: Right;   REDUCTION MAMMAPLASTY Bilateral 2010   SHOULDER SURGERY Left 1982   bone spur removed/ bone separation repair   thumb joint replacement     L thumb Dr. Amedeo Plenty    TONSILLECTOMY AND ADENOIDECTOMY  1967   Family History  Problem Relation Age of Onset   Arthritis Mother    Hypertension Mother    Kidney disease Mother    Alcohol abuse Father    Arthritis Father    Lung cancer Father 51   Hypertension Father    Alcohol abuse Brother    Hypertension Brother    Colon polyps Brother    Lung cancer Maternal Grandmother    Hypertension Maternal Grandmother    Arthritis Maternal Aunt    Arthritis Maternal Uncle    Lung cancer Maternal Uncle    Bladder Cancer Maternal Uncle    Colon cancer Paternal Uncle        x5 "Lynch+"   Leukemia Paternal Uncle    Lung cancer Paternal Uncle    Rectal cancer Cousin        vs anal   Breast cancer Cousin        dx 51s   Colon cancer Cousin        dx 32s; "Lynch +"  Melanoma Cousin    Bladder Cancer Cousin    Breast cancer Cousin        dx 8s   Other Other        lynch   Social History   Socioeconomic History   Marital status: Married    Spouse name: Not on file   Number of children: Not on file   Years of education: Not on file   Highest education level: Not on file  Occupational History   Not on file  Tobacco Use   Smoking status: Former    Types: Cigarettes   Smokeless tobacco: Never   Tobacco comments:    socially in college, 1 pk lasted 1 month  Vaping Use   Vaping Use: Never used  Substance and Sexual Activity   Alcohol use: Yes    Alcohol/week: 0.0 - 1.0 standard drinks of alcohol    Comment: social   Drug use: No   Sexual activity: Not Currently    Birth control/protection: Post-menopausal  Other Topics Concern   Not on file  Social History Narrative   Married   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   Ascorbic Acid (VITAMIN C) 1000 MG tablet Take 1,000 mg by mouth daily.   Biotin 10000 MCG TABS    Black Pepper-Turmeric (TURMERIC CURCUMIN) 09-998 MG CAPS    Calcium-Magnesium 70-83 MG CAPS Take by mouth in the morning and at bedtime. Calm supplement   Cholecalciferol (VITAMIN D) 125 MCG (5000 UT) CAPS    cyclobenzaprine (FLEXERIL) 10 MG tablet TAKE 1 TABLET (10 MG TOTAL) BY MOUTH AT BEDTIME prn   Multiple Vitamins-Minerals (WOMENS MULTIVITAMIN PLUS PO) Take by mouth once.   Omega-3 Fatty Acids (FISH OIL) 1000 MG CAPS Take by mouth.   pyridOXINE (VITAMIN B-6) 100 MG tablet Take 100 mg by mouth daily.    [DISCONTINUED] ALPRAZolam (XANAX) 0.25 MG tablet Take 1 tablet (0.25 mg total) by mouth daily as needed for anxiety.   [DISCONTINUED] citalopram (CELEXA) 20 MG tablet Take 1 tablet (20 mg total) by mouth daily.   [DISCONTINUED] omeprazole (PRILOSEC) 20 MG capsule TAKE 1 CAPSULE BY MOUTH ONCE DAILY 30 MINUTES BEFORE FOOD   Allergies  Allergen Reactions   Effexor Xr [Venlafaxine Hcl]     Worsening anxiety, thoughts of wanting to leave this earth tapered off end 12/2020/01/2021   Recent Results (from the past 2160 hour(s))  Comprehensive metabolic panel     Status: None   Collection Time: 12/17/21  9:33 AM  Result Value Ref Range   Sodium 140 135 - 145 mEq/L   Potassium 4.3 3.5 - 5.1 mEq/L   Chloride 103 96 - 112 mEq/L   CO2 28 19 - 32 mEq/L   Glucose, Bld 88 70 - 99 mg/dL   BUN 13 6 - 23 mg/dL   Creatinine, Ser 0.72 0.40 - 1.20 mg/dL   Total Bilirubin  0.4 0.2 - 1.2 mg/dL   Alkaline Phosphatase 53 39 - 117  U/L   AST 19 0 - 37 U/L   ALT 16 0 - 35 U/L   Total Protein 6.8 6.0 - 8.3 g/dL   Albumin 4.3 3.5 - 5.2 g/dL   GFR 91.33 >60.00 mL/min    Comment: Calculated using the CKD-EPI Creatinine Equation (2021)   Calcium 9.3 8.4 - 10.5 mg/dL  Vitamin B12     Status: None   Collection Time: 12/17/21  9:33 AM  Result Value Ref Range   Vitamin B-12 390 211 - 911 pg/mL  Urinalysis, Routine w reflex microscopic     Status: None   Collection Time: 12/17/21  9:33 AM  Result Value Ref Range   Color, Urine YELLOW YELLOW   APPearance CLEAR CLEAR   Specific Gravity, Urine 1.024 1.001 - 1.035   pH 7.0 5.0 - 8.0   Glucose, UA NEGATIVE NEGATIVE   Bilirubin Urine NEGATIVE NEGATIVE   Ketones, ur NEGATIVE NEGATIVE   Hgb urine dipstick NEGATIVE NEGATIVE   Protein, ur NEGATIVE NEGATIVE   Nitrite NEGATIVE NEGATIVE   Leukocytes,Ua NEGATIVE NEGATIVE  TSH     Status: None   Collection Time: 12/17/21  9:33 AM  Result Value Ref Range   TSH 0.94 0.35 - 5.50 uIU/mL  Lipid panel     Status: Abnormal   Collection Time: 12/17/21  9:33 AM  Result Value Ref Range   Cholesterol 224 (H) 0 - 200 mg/dL    Comment: ATP III Classification       Desirable:  < 200 mg/dL               Borderline High:  200 - 239 mg/dL          High:  > = 240 mg/dL   Triglycerides 85.0 0.0 - 149.0 mg/dL    Comment: Normal:  <150 mg/dLBorderline High:  150 - 199 mg/dL   HDL 79.60 >39.00 mg/dL   VLDL 17.0 0.0 - 40.0 mg/dL   LDL Cholesterol 127 (H) 0 - 99 mg/dL   Total CHOL/HDL Ratio 3     Comment:                Men          Women1/2 Average Risk     3.4          3.3Average Risk          5.0          4.42X Average Risk          9.6          7.13X Average Risk          15.0          11.0                       NonHDL 144.47     Comment: NOTE:  Non-HDL goal should be 30 mg/dL higher than patient's LDL goal (i.e. LDL goal of < 70 mg/dL, would have non-HDL goal of < 100 mg/dL)  CBC with  Differential/Platelet     Status: Abnormal   Collection Time: 12/17/21  9:33 AM  Result Value Ref Range   WBC 4.1 4.0 - 10.5 K/uL   RBC 4.19 3.87 - 5.11 Mil/uL   Hemoglobin 12.1 12.0 - 15.0 g/dL   HCT 37.1 36.0 - 46.0 %   MCV 88.6 78.0 - 100.0 fl   MCHC 32.5 30.0 - 36.0 g/dL   RDW 15.1 11.5 - 15.5 %  Platelets 235.0 150.0 - 400.0 K/uL   Neutrophils Relative % 41.2 (L) 43.0 - 77.0 %   Lymphocytes Relative 43.4 12.0 - 46.0 %   Monocytes Relative 9.7 3.0 - 12.0 %   Eosinophils Relative 3.9 0.0 - 5.0 %   Basophils Relative 1.8 0.0 - 3.0 %   Neutro Abs 1.7 1.4 - 7.7 K/uL   Lymphs Abs 1.8 0.7 - 4.0 K/uL   Monocytes Absolute 0.4 0.1 - 1.0 K/uL   Eosinophils Absolute 0.2 0.0 - 0.7 K/uL   Basophils Absolute 0.1 0.0 - 0.1 K/uL   Objective  Body mass index is 32.28 kg/m. Wt Readings from Last 3 Encounters:  02/13/22 182 lb 3.2 oz (82.6 kg)  11/13/21 182 lb 6.4 oz (82.7 kg)  03/22/21 163 lb 9.6 oz (74.2 kg)   Temp Readings from Last 3 Encounters:  02/13/22 98.4 F (36.9 C) (Oral)  11/13/21 98.2 F (36.8 C) (Oral)  03/22/21 99 F (37.2 C) (Oral)   BP Readings from Last 3 Encounters:  02/13/22 136/80  11/13/21 140/90  03/22/21 120/79   Pulse Readings from Last 3 Encounters:  02/13/22 65  11/13/21 76  03/22/21 71    Physical Exam Vitals and nursing note reviewed.  Constitutional:      Appearance: Normal appearance. She is well-developed and well-groomed.  HENT:     Head: Normocephalic and atraumatic.  Eyes:     Conjunctiva/sclera: Conjunctivae normal.     Pupils: Pupils are equal, round, and reactive to light.  Cardiovascular:     Rate and Rhythm: Normal rate and regular rhythm.     Heart sounds: Normal heart sounds. No murmur heard. Pulmonary:     Effort: Pulmonary effort is normal.     Breath sounds: Normal breath sounds.  Abdominal:     General: Abdomen is flat. Bowel sounds are normal.     Tenderness: There is no abdominal tenderness.  Musculoskeletal:         General: No tenderness.  Skin:    General: Skin is warm and dry.  Neurological:     General: No focal deficit present.     Mental Status: She is alert and oriented to person, place, and time. Mental status is at baseline.     Cranial Nerves: Cranial nerves 2-12 are intact.     Motor: Motor function is intact.     Coordination: Coordination is intact.     Gait: Gait is intact.  Psychiatric:        Attention and Perception: Attention and perception normal.        Mood and Affect: Mood and affect normal.        Speech: Speech normal.        Behavior: Behavior normal. Behavior is cooperative.        Thought Content: Thought content normal.        Cognition and Memory: Cognition and memory normal.        Judgment: Judgment normal.     Assessment  Plan  Primary hypertension - Plan: amLODipine (NORVASC) 2.5 MG tablet, Amb Ref to Medical Weight Management BMI 32.0-32.9,adult - Plan: Amb Ref to Medical Weight Management Hyperlipidemia, unspecified hyperlipidemia type - Plan: Amb Ref to Medical Weight Management  Anxiety and depression - Plan: ALPRAZolam (XANAX) 0.25 MG tablet celexa 20 mg qd  Gastroesophageal reflux disease without esophagitis - Rx RF omeprazole.  - Plan: omeprazole (PRILOSEC) 20 MG capsule  Neutropenia, unspecified type ([place]) - Plan: CBC with Differential/Platelet Consider hematology again if  abnl again will consult via epic 1st if needed   Family history of Lynch syndrome - Plan: Ambulatory referral to Gastroenterology Encounter for screening colonoscopy FH brother with polyps- Plan: Ambulatory referral to Gastroenterology  Anxiety - Plan: citalopram (CELEXA) 20 MG tablet Irritability and anger - Plan: citalopram (CELEXA) 20 MG tablet   HM Flu shot given today Tdap utd  rec hep B  mmr immune  covid 2/2 had booster declines in future Consider shingrix in future  Consider prevnar 20, shingrix and RSV vaccine    Pap 02/07/20 negative neg HPV  --> ob/gyn Dr.  Sumner Boast in [place] 01/22/21 negative ordered 2023  pseudostromal angiomatous hyperplasia and calcifications f/u in 1 year ordered  -right dx mammo 09/14/18 negative but pt still having breast pain discussed breast surgery if continues vs breast MRI will try to reorder b/l diag mammo with [place]  F/u breast surgery Dr. Donne Hazel in [place]   Colonoscopy 06/10/13 Dr. Loraine Maple [facility] negative repeat in 10 years scanned under media  FH lynch syndrome & brother with polyps  Dermatology Dr. Kellie Moor saw 03/20/21 normal    DEXA age 76 y.o    Cont healthy diet and exercise and goal wt 130s Provider: Dr. Olivia Mackie McLean-Scocuz[facility]

## 2022-02-13 NOTE — Patient Instructions (Addendum)
Consider shingrix vaccine x 2 doses  Consider prevnar 20 vaccine  RSV vaccine at 61 y.o   [facility]  Phone Fax E-mail Address  [phone] Not available [address]   [place] [address]     Specialties     Gastroenterology              Dr. Helane Rima bmi, htn/hld obesity bmi [address] Services  [address]  [place], [place] [address]  Call us [phone]  Our Hours Monday-Thursday 7:00 am - 5:00 pm Friday: Closed     Pneumococcal Conjugate Vaccine (Prevnar 20) Suspension for Injection What is this medication? PNEUMOCOCCAL VACCINE (NEU mo KOK al vak SEEN) is a vaccine. It prevents pneumococcus bacterial infections. These bacteria can cause serious infections like pneumonia, meningitis, and blood infections. This vaccine will not treat an infection and will not cause infection. This vaccine is recommended for adults 18 years and older. This medicine may be used for other purposes; ask your health care provider or pharmacist if you have questions. COMMON BRAND NAME(S): Prevnar 20 What should I tell my care team before I take this medication? They need to know if you have any of these conditions: bleeding disorder fever immune system problems an unusual or allergic reaction to pneumococcal vaccine, diphtheria toxoid, other vaccines, other medicines, foods, dyes, or preservatives pregnant or trying to get pregnant breast-feeding How should I use this medication? This vaccine is injected into a muscle. It is given by a health care provider. A copy of Vaccine Information Statements will be given before each vaccination. Be sure to read this information carefully each time. This sheet may change often. Talk to your health care provider about the use of this medicine in children. Special care may be needed. Overdosage: If you think you have taken too much of this medicine contact a poison control center or emergency room at once. NOTE: This medicine  is only for you. Do not share this medicine with others. What if I miss a dose? This does not apply. This medicine is not for regular use. What may interact with this medication? medicines for cancer chemotherapy medicines that suppress your immune function steroid medicines like prednisone or cortisone This list may not describe all possible interactions. Give your health care provider a list of all the medicines, herbs, non-prescription drugs, or dietary supplements you use. Also tell them if you smoke, drink alcohol, or use illegal drugs. Some items may interact with your medicine. What should I watch for while using this medication? Mild fever and pain should go away in 3 days or less. Report any unusual symptoms to your health care provider. What side effects may I notice from receiving this medication? Side effects that you should report to your doctor or health care professional as soon as possible: allergic reactions (skin rash, itching or hives; swelling of the face, lips, or tongue) confusion fast, irregular heartbeat fever over 102 degrees F muscle weakness seizures trouble breathing unusual bruising or bleeding Side effects that usually do not require medical attention (report to your doctor or health care professional if they continue or are bothersome): fever of 102 degrees F or less headache joint pain muscle cramps, pain pain, tender at site where injected This list may not describe all possible side effects. Call your doctor for medical advice about side effects. You may report side effects to FDA at 1-800-FDA-1088. Where should I keep my medication? This vaccine is only given by  a health care provider. It will not be stored at home. NOTE: This sheet is a summary. It may not cover all possible information. If you have questions about this medicine, talk to your doctor, pharmacist, or health care provider.  2023 Elsevier/Gold Standard (2019-12-24 00:00:00)  Zoster  Vaccine, Recombinant injection What is this medication? ZOSTER VACCINE (ZOS ter vak SEEN) is a vaccine used to reduce the risk of getting shingles. This vaccine is not used to treat shingles or nerve pain from shingles. This medicine may be used for other purposes; ask your health care provider or pharmacist if you have questions. COMMON BRAND NAME(S): [facility] What should I tell my care team before I take this medication? They need to know if you have any of these conditions: cancer immune system problems an unusual or allergic reaction to Zoster vaccine, other medications, foods, dyes, or preservatives pregnant or trying to get pregnant breast-feeding How should I use this medication? This vaccine is injected into a muscle. It is given by a health care provider. A copy of Vaccine Information Statements will be given before each vaccination. Be sure to read this information carefully each time. This sheet may change often. Talk to your health care provider about the use of this vaccine in children. This vaccine is not approved for use in children. Overdosage: If you think you have taken too much of this medicine contact a poison control center or emergency room at once. NOTE: This medicine is only for you. Do not share this medicine with others. What if I miss a dose? Keep appointments for follow-up (booster) doses. It is important not to miss your dose. Call your health care provider if you are unable to keep an appointment. What may interact with this medication? medicines that suppress your immune system medicines to treat cancer steroid medicines like prednisone or cortisone This list may not describe all possible interactions. Give your health care provider a list of all the medicines, herbs, non-prescription drugs, or dietary supplements you use. Also tell them if you smoke, drink alcohol, or use illegal drugs. Some items may interact with your medicine. What should I watch for while  using this medication? Visit your health care provider regularly. This vaccine, like all vaccines, may not fully protect everyone. What side effects may I notice from receiving this medication? Side effects that you should report to your doctor or health care professional as soon as possible: allergic reactions (skin rash, itching or hives; swelling of the face, lips, or tongue) trouble breathing Side effects that usually do not require medical attention (report these to your doctor or health care professional if they continue or are bothersome): chills headache fever nausea pain, redness, or irritation at site where injected tiredness vomiting This list may not describe all possible side effects. Call your doctor for medical advice about side effects. You may report side effects to FDA at 1-800-FDA-1088. Where should I keep my medication? This vaccine is only given by a health care provider. It will not be stored at home. NOTE: This sheet is a summary. It may not cover all possible information. If you have questions about this medicine, talk to your doctor, pharmacist, or health care provider.  2023 Elsevier/Gold Standard (2021-03-23 00:00:00)

## 2022-02-14 ENCOUNTER — Encounter: Payer: Self-pay | Admitting: Obstetrics and Gynecology

## 2022-02-14 ENCOUNTER — Ambulatory Visit (INDEPENDENT_AMBULATORY_CARE_PROVIDER_SITE_OTHER): Payer: BC Managed Care – PPO | Admitting: Obstetrics and Gynecology

## 2022-02-14 VITALS — BP 138/82 | HR 77 | Ht 63.0 in | Wt 182.0 lb

## 2022-02-14 DIAGNOSIS — Z01419 Encounter for gynecological examination (general) (routine) without abnormal findings: Secondary | ICD-10-CM | POA: Diagnosis not present

## 2022-02-14 DIAGNOSIS — Z9889 Other specified postprocedural states: Secondary | ICD-10-CM | POA: Insufficient documentation

## 2022-02-14 NOTE — Patient Instructions (Signed)

## 2022-02-25 ENCOUNTER — Telehealth: Payer: Self-pay | Admitting: Licensed Clinical Social Worker

## 2022-02-25 NOTE — Telephone Encounter (Signed)
I contacted Ms. Frid to discuss her genetic testing results. No pathogenic variants were identified in the 101 genes analyzed. Detailed clinic note to follow.   The test report has been scanned into EPIC and is located under the Molecular Pathology section of the Results Review tab.  A portion of the result report is included below for reference.      Faith Rogue, MS, [facility] Genetic Counselor [email or web address] Phone: [phone]

## 2022-02-26 ENCOUNTER — Encounter: Payer: Self-pay | Admitting: Licensed Clinical Social Worker

## 2022-02-26 ENCOUNTER — Ambulatory Visit: Payer: Self-pay | Admitting: Licensed Clinical Social Worker

## 2022-02-26 DIAGNOSIS — Z1379 Encounter for other screening for genetic and chromosomal anomalies: Secondary | ICD-10-CM

## 2022-02-26 NOTE — Progress Notes (Signed)
HPI:   Ms. Gilder was previously seen in the [facility] due to a family history of cancer and concerns regarding a hereditary predisposition to cancer. Please refer to our prior cancer genetics clinic note for more information regarding our discussion, assessment and recommendations, at the time. Ms. Portman's recent genetic test results were disclosed to her, as were recommendations warranted by these results. These results and recommendations are discussed in more detail below.  CANCER HISTORY:  Oncology History   No history exists.    FAMILY HISTORY:  We obtained a detailed, 4-generation family history.  Significant diagnoses are listed below: Family History  Problem Relation Age of Onset   Arthritis Mother    Hypertension Mother    Kidney disease Mother    Alcohol abuse Father    Arthritis Father    Lung cancer Father 79   Hypertension Father    Alcohol abuse Brother    Hypertension Brother    Colon polyps Brother    Lung cancer Maternal Grandmother    Hypertension Maternal Grandmother    Arthritis Maternal Aunt    Arthritis Maternal Uncle    Lung cancer Maternal Uncle    Bladder Cancer Maternal Uncle    Colon cancer Paternal Uncle        x5 "Lynch+"   Leukemia Paternal Uncle    Lung cancer Paternal Uncle    Rectal cancer Cousin        vs anal   Breast cancer Cousin        dx 40s   Colon cancer Cousin        dx 41s; "Lynch +"   Melanoma Cousin    Bladder Cancer Cousin    Breast cancer Cousin        dx 16s   Other Other        lynch   Ms. Klonowski has 1 brother, 18. She has 1 son, 46.    Ms. Stones's mother passed at 60. Patient had 1 uncle with bladder cancer, 1 uncle with lung cancer, 1 cousin with melanoma, 1 cousin with colon vs anal cancer, and 1 cousin with bladder cancer. Maternal grandmother had lung cancer and her sister had colon cancer.   Ms. Besecker's father had lung cancer and passed at 6. Patient had 1 uncle who had  colon cancer 5 times and reportedly had Lynch syndrome. He was initially diagnosed in his 72s and is passed away. He had 2 daughters, one has had breast and colon cancer in her 33s and is Lynch positive (unknown gene, patient will try to get copy of report), and his other daughter also had breast in her 70s but she tested negative for Lynch. Patient had another maternal uncle that had leukemia, another uncle that had lung cancer, and an uncle with skin cancer.   Ms. Consalvo is aware of previous family history of genetic testing for hereditary cancer risks. There is no reported Ashkenazi Jewish ancestry. There is no known consanguinity.      GENETIC TEST RESULTS:  The [facility] Multi-Cancer+Leukemia+RNA Panel found no pathogenic mutations. .   The test report has been scanned into EPIC and is located under the Molecular Pathology section of the Results Review tab.  A portion of the result report is included below for reference. Genetic testing reported out on 02/22/2022.      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  At this time, it is unknown if this variant  is associated with an increased risk for cancer or if it is benign, but most uncertain variants are reclassified to benign. It should not be used to make medical management decisions. With time, we suspect the laboratory will determine the significance of this variant, if any. If the laboratory reclassifies this variant, we will attempt to contact Ms. Morr to discuss it further.   Even though a pathogenic variant was not identified, possible explanations for the cancer in the family may include: There may be no hereditary risk for cancer in the family. The cancers in her family may be sporadic/familial or due to other genetic and environmental factors. There may be a gene mutation in one of these genes that current testing methods cannot detect but that chance is small. There could be another  gene that has not yet been discovered, or that we have not yet tested, that is responsible for the cancer diagnoses in the family.  It is also possible there is a hereditary cause for the cancer in the family that Ms. Cyphers did not inherit.  Therefore, it is important to remain in touch with cancer genetics in the future so that we can continue to offer Ms. Catanese the most up to date genetic testing.   ADDITIONAL GENETIC TESTING:  We discussed with Ms. Brummond that her genetic testing was fairly extensive.  If there are additional relevant genes identified to increase cancer risk that can be analyzed in the future, we would be happy to discuss and coordinate this testing at that time.    CANCER SCREENING RECOMMENDATIONS:  Ms. Osier's test result is considered negative (normal).  This means that we have not identified a hereditary cause for her family history of cancer at this time. This testing includes the Lynch syndrome genes. A mutation was not identified in any of these genes.  An individual's cancer risk and medical management are not determined by genetic test results alone. Overall cancer risk assessment incorporates additional factors, including personal medical history, family history, and any available genetic information that may result in a personalized plan for cancer prevention and surveillance. Therefore, it is recommended she continue to follow the cancer management and screening guidelines provided by her primary healthcare provider.  RECOMMENDATIONS FOR FAMILY MEMBERS:   Since she did not inherit a identifiable mutation in a cancer predisposition gene included on this panel, her children could not have inherited a known mutation from her in one of these genes. Individuals in this family might be at some increased risk of developing cancer, over the general population risk, due to the family history of cancer.  Individuals in the family should notify their providers of the  family history of cancer. We recommend women in this family have a yearly mammogram beginning at age 35, or 58 years younger than the earliest onset of cancer, an annual clinical breast exam, and perform monthly breast self-exams.  Family members should have colonoscopies by at age 59, or earlier, as recommended by their providers. Other members of the family may still carry a pathogenic variant in one of these genes that Ms. Vanderwerf did not inherit. Based on the family history, we recommend paternal relatives have testing for the known familial Lynch syndrome mutation. Ms. Fretz will let us know if we can be of any assistance in coordinating genetic counseling and/or testing for this family member.   We do not recommend familial testing for the SDHB variant of uncertain significance (VUS).  FOLLOW-UP:  Lastly, we discussed with  Ms. Temkin that cancer genetics is a rapidly advancing field and it is possible that new genetic tests will be appropriate for her and/or her family members in the future. We encouraged her to remain in contact with cancer genetics on an annual basis so we can update her personal and family histories and let her know of advances in cancer genetics that may benefit this family.   Our contact number was provided. Ms. Dearmas's questions were answered to her satisfaction, and she knows she is welcome to call us at anytime with additional questions or concerns.    Lacy Duverney, MS, [facility] Genetic Counselor [email or web address] Phone: [phone]

## 2022-05-10 ENCOUNTER — Encounter: Payer: Self-pay | Admitting: Nurse Practitioner

## 2022-05-10 ENCOUNTER — Ambulatory Visit: Payer: BC Managed Care – PPO | Admitting: Nurse Practitioner

## 2022-05-10 VITALS — BP 132/84 | HR 73 | Temp 98.5°F | Ht 63.0 in | Wt 169.4 lb

## 2022-05-10 DIAGNOSIS — Z0000 Encounter for general adult medical examination without abnormal findings: Secondary | ICD-10-CM | POA: Diagnosis not present

## 2022-05-10 DIAGNOSIS — I10 Essential (primary) hypertension: Secondary | ICD-10-CM | POA: Insufficient documentation

## 2022-05-10 DIAGNOSIS — F32A Depression, unspecified: Secondary | ICD-10-CM

## 2022-05-10 DIAGNOSIS — K219 Gastro-esophageal reflux disease without esophagitis: Secondary | ICD-10-CM | POA: Diagnosis not present

## 2022-05-10 DIAGNOSIS — F419 Anxiety disorder, unspecified: Secondary | ICD-10-CM

## 2022-05-10 MED ORDER — OMEPRAZOLE 40 MG PO CPDR: 40.0000 mg | DELAYED_RELEASE_CAPSULE | Freq: Every day | ORAL | 3 refills | Status: DC

## 2022-05-10 NOTE — Assessment & Plan Note (Addendum)
Patient started on Norvasc 2.5 in October 2023. Stable. Continue current dose. Will monitor.

## 2022-05-10 NOTE — Assessment & Plan Note (Signed)
Stable. On Celexa 20 daily and Xanax 0.25 PRN. Rarely using Xanax. Continue current regimen.

## 2022-05-10 NOTE — Assessment & Plan Note (Signed)
Worsening symptoms since starting on Wegovy. Will increase Omeprazole to 40mg  daily. Advised to monitor diet, avoid acidic foods and decrease caffeine.

## 2022-05-10 NOTE — Progress Notes (Signed)
Tomasita Morrow, NP-C Phone: [phone]  Yvonne Moore is a 62 y.o. female who presents today for transfer of care. She has no complaints or new concerns. She is doing well on all of her medications. She was previously referred to weight management services where she has started on Wegovy and Adipex. She has lost 12 pounds.   HYPERTENSION Disease Monitoring Home BP Monitoring- Not checking at home Chest pain- No    Dyspnea- No Medications Compliance-  Norvasc 2.5 daily. Lightheadedness-  No  Edema- No  GERD:   Reflux symptoms: Increased heartburn since starting Wegovy. Having to take Tums or Prevacid in the evenings   Abd pain: No   Blood in stool: No  Dysphagia: No   EGD: No  Medication: Omeprazole 20 daily.  Anxiety/Depression: Patient reports doing very well since starting on Celexa 20mg  daily at her last appointment. She states she rarely has to take the Xanax 0.25. She denies SI/HI today. She is no longer working with a therapist.   BMET    Component Value Date/Time   NA 140 12/17/2021 0933   NA 142 10/29/2016 0839   K 4.3 12/17/2021 0933   CL 103 12/17/2021 0933   CO2 28 12/17/2021 0933   GLUCOSE 88 12/17/2021 0933   BUN 13 12/17/2021 0933   BUN 8 10/29/2016 0839   CREATININE 0.72 12/17/2021 0933   CREATININE 0.70 02/09/2021 1457   CALCIUM 9.3 12/17/2021 0933   GFRNONAA 86 10/29/2016 0839   GFRAA 100 10/29/2016 0839     Social History   Tobacco Use  Smoking Status Former   Types: Cigarettes  Smokeless Tobacco Never  Tobacco Comments   socially in college, 1 pk lasted 1 month    Current Outpatient Medications on File Prior to Visit  Medication Sig Dispense Refill   ALPRAZolam (XANAX) 0.25 MG tablet Take 1 tablet (0.25 mg total) by mouth daily as needed for anxiety. 30 tablet 5   amLODipine (NORVASC) 2.5 MG tablet Take 1 tablet (2.5 mg total) by mouth daily. 90 tablet 3   Ascorbic Acid (VITAMIN C) 1000 MG tablet Take 1,000 mg by mouth daily.     Biotin  10000 MCG TABS      Black Pepper-Turmeric (TURMERIC CURCUMIN) 09-998 MG CAPS      Cholecalciferol (VITAMIN D) 125 MCG (5000 UT) CAPS      citalopram (CELEXA) 20 MG tablet Take 1 tablet (20 mg total) by mouth daily. 90 tablet 3   cyclobenzaprine (FLEXERIL) 10 MG tablet TAKE 1 TABLET (10 MG TOTAL) BY MOUTH AT BEDTIME prn 30 tablet 5   MELOXICAM PO      Multiple Vitamins-Minerals (WOMENS MULTIVITAMIN PLUS PO) Take by mouth once.     Omega-3 Fatty Acids (FISH OIL) 1000 MG CAPS Take by mouth.     phentermine 15 MG capsule Take 15 mg by mouth every morning.     pyridOXINE (VITAMIN B-6) 100 MG tablet Take 100 mg by mouth daily.      WEGOVY 0.5 MG/0.5ML SOAJ Inject 0.5 mg into the skin once a week.     No current facility-administered medications on file prior to visit.     ROS see history of present illness  Objective  Physical Exam Vitals:   05/10/22 1311  BP: 132/84  Pulse: 73  Temp: 98.5 F (36.9 C)  SpO2: 98%    BP Readings from Last 3 Encounters:  05/10/22 132/84  02/14/22 138/82  02/13/22 136/80   Wt Readings from Last 3 Encounters:  05/10/22 169 lb 6.4 oz (76.8 kg)  02/14/22 182 lb (82.6 kg)  02/13/22 182 lb 3.2 oz (82.6 kg)    Physical Exam Constitutional:      General: She is not in acute distress.    Appearance: Normal appearance.  HENT:     Head: Normocephalic.  Cardiovascular:     Rate and Rhythm: Normal rate and regular rhythm.     Heart sounds: Normal heart sounds.  Pulmonary:     Effort: Pulmonary effort is normal.     Breath sounds: Normal breath sounds.  Skin:    General: Skin is warm and dry.  Neurological:     Mental Status: She is alert.  Psychiatric:        Mood and Affect: Mood normal.        Behavior: Behavior normal.    Assessment/Plan: Please see individual problem list.  Gastroesophageal reflux disease without esophagitis Assessment & Plan: Worsening symptoms since starting on Wegovy. Will increase Omeprazole to 40mg  daily. Advised  to monitor diet, avoid acidic foods and decrease caffeine.   Orders: -     Omeprazole; Take 1 capsule (40 mg total) by mouth daily.  Dispense: 90 capsule; Refill: 3  Primary hypertension Assessment & Plan: Patient started on Norvasc 2.5 in October 2023. Stable. Continue current dose. Will monitor.    Anxiety and depression Assessment & Plan: Stable. On Celexa 20 daily and Xanax 0.25 PRN. Rarely using Xanax. Continue current regimen.    Preventative health care -     CBC with Differential/Platelet; Future -     Comprehensive metabolic panel; Future -     TSH; Future -     Hemoglobin A1c; Future -     Lipid panel; Future -     VITAMIN D 25 Hydroxy (Vit-D Deficiency, Fractures); Future    Return in about 7 months (around 12/19/2022) for Annual Exam. Please complete labs 2-3 days prior.   Tomasita Morrow, NP-C [place]

## 2022-06-04 ENCOUNTER — Encounter: Payer: BC Managed Care – PPO | Admitting: Family Medicine

## 2022-07-15 ENCOUNTER — Encounter: Payer: BC Managed Care – PPO | Admitting: Family Medicine

## 2022-09-19 ENCOUNTER — Encounter: Payer: Self-pay | Admitting: Physician Assistant

## 2022-09-19 ENCOUNTER — Other Ambulatory Visit: Payer: Self-pay | Admitting: Physician Assistant

## 2022-09-19 DIAGNOSIS — Z8249 Family history of ischemic heart disease and other diseases of the circulatory system: Secondary | ICD-10-CM

## 2022-09-19 DIAGNOSIS — E785 Hyperlipidemia, unspecified: Secondary | ICD-10-CM

## 2022-10-22 ENCOUNTER — Ambulatory Visit
Admission: RE | Admit: 2022-10-22 | Discharge: 2022-10-22 | Disposition: A | Discharge status: Home / Self Care | Payer: No Typology Code available for payment source | Source: Ambulatory Visit | Attending: Family Medicine | Admitting: Family Medicine

## 2022-10-22 DIAGNOSIS — Z8249 Family history of ischemic heart disease and other diseases of the circulatory system: Secondary | ICD-10-CM

## 2022-11-06 ENCOUNTER — Other Ambulatory Visit: Payer: Self-pay | Admitting: Nurse Practitioner

## 2022-11-06 ENCOUNTER — Encounter: Payer: Self-pay | Admitting: Nurse Practitioner

## 2022-11-06 DIAGNOSIS — I10 Essential (primary) hypertension: Secondary | ICD-10-CM

## 2022-11-08 ENCOUNTER — Other Ambulatory Visit: Payer: Self-pay | Admitting: General Surgery

## 2022-11-08 ENCOUNTER — Other Ambulatory Visit: Payer: BC Managed Care – PPO

## 2022-11-08 DIAGNOSIS — Z1231 Encounter for screening mammogram for malignant neoplasm of breast: Secondary | ICD-10-CM

## 2022-11-11 ENCOUNTER — Other Ambulatory Visit (INDEPENDENT_AMBULATORY_CARE_PROVIDER_SITE_OTHER): Payer: BC Managed Care – PPO

## 2022-11-11 DIAGNOSIS — Z0000 Encounter for general adult medical examination without abnormal findings: Secondary | ICD-10-CM

## 2022-11-11 LAB — COMPREHENSIVE METABOLIC PANEL
ALT: 22 U/L (ref 0–35)
AST: 22 U/L (ref 0–37)
Albumin: 4.2 g/dL (ref 3.5–5.2)
Alkaline Phosphatase: 45 U/L (ref 39–117)
BUN: 12 mg/dL (ref 6–23)
CO2: 29 mEq/L (ref 19–32)
Calcium: 9.4 mg/dL (ref 8.4–10.5)
Chloride: 103 mEq/L (ref 96–112)
Creatinine, Ser: 0.81 mg/dL (ref 0.40–1.20)
GFR: 78.8 mL/min (ref >60.00)
Glucose, Bld: 92 mg/dL (ref 70–99)
Potassium: 4.3 mEq/L (ref 3.5–5.1)
Sodium: 139 mEq/L (ref 135–145)
Total Bilirubin: 0.4 mg/dL (ref 0.2–1.2)
Total Protein: 6.9 g/dL (ref 6.0–8.3)

## 2022-11-11 LAB — CBC WITH DIFFERENTIAL/PLATELET
Basophils Absolute: 0.1 K/uL (ref 0.0–0.1)
Basophils Relative: 1.2 % (ref 0.0–3.0)
Eosinophils Absolute: 0.1 K/uL (ref 0.0–0.7)
Eosinophils Relative: 3.1 % (ref 0.0–5.0)
HCT: 39.6 % (ref 36.0–46.0)
Hemoglobin: 12.9 g/dL (ref 12.0–15.0)
Lymphocytes Relative: 40.3 % (ref 12.0–46.0)
Lymphs Abs: 1.8 K/uL (ref 0.7–4.0)
MCHC: 32.6 g/dL (ref 30.0–36.0)
MCV: 90.8 fl (ref 78.0–100.0)
Monocytes Absolute: 0.4 K/uL (ref 0.1–1.0)
Monocytes Relative: 8.8 % (ref 3.0–12.0)
Neutro Abs: 2.1 K/uL (ref 1.4–7.7)
Neutrophils Relative %: 46.6 % (ref 43.0–77.0)
Platelets: 254 K/uL (ref 150.0–400.0)
RBC: 4.36 Mil/uL (ref 3.87–5.11)
RDW: 13.5 % (ref 11.5–15.5)
WBC: 4.5 K/uL (ref 4.0–10.5)

## 2022-11-11 LAB — LIPID PANEL
Cholesterol: 185 mg/dL (ref 0–200)
HDL: 74.1 mg/dL (ref >39.00)
LDL Cholesterol: 99 mg/dL (ref 0–99)
NonHDL: 110.53
Total CHOL/HDL Ratio: 2
Triglycerides: 56 mg/dL (ref 0.0–149.0)
VLDL: 11.2 mg/dL (ref 0.0–40.0)

## 2022-11-11 LAB — HEMOGLOBIN A1C: Hgb A1c MFr Bld: 5.1 % (ref 4.6–6.5)

## 2022-11-11 LAB — TSH: TSH: 1.11 u[IU]/mL (ref 0.35–5.50)

## 2022-11-11 LAB — VITAMIN D 25 HYDROXY (VIT D DEFICIENCY, FRACTURES): VITD: 79.8 ng/mL (ref 30.00–100.00)

## 2022-11-12 NOTE — Progress Notes (Unsigned)
Bethanie Dicker, NP-C Phone: [phone]  Yvonne Moore is a 62 y.o. female who presents today for annual exam. She completed lab work prior to her appointment and would like to review her results. She has no complaints or new concerns today. She is doing well on all of her medications.   Diet: Better since being on Zepbound, increased protein, likes sweets at night Exercise: Walking 45 minutes daily at least 5 days per week Pap smear: 02/07/2020- Appointment scheduled for 02/17/2023 with Ob-Gyn Colonoscopy: 06/04/2012 - 10 year recall - Due! Mammogram: Scheduled for 11/14/2022 Family history-  Colon cancer: Yes, paternal uncle  Breast cancer: No  Ovarian cancer: No Menses: Postmenopausal Sexually active: Yes Vaccines-   Flu: UTD  Tetanus: 12/03/2017  Shingles: Due- interested, will get at local pharmacy  COVID19: x 2 HIV screening: Negative Hep C Screening: Negative Tobacco use: No Alcohol use: Yes, 1 drink daily Illicit Drug use: No Dentist: Yes Ophthalmology: No, trying to establish    Social History   Tobacco Use  Smoking Status Former   Types: Cigarettes  Smokeless Tobacco Never  Tobacco Comments   socially in college, 1 pk lasted 1 month    Current Outpatient Medications on File Prior to Visit  Medication Sig Dispense Refill   ALPRAZolam (XANAX) 0.25 MG tablet Take 1 tablet (0.25 mg total) by mouth daily as needed for anxiety. 30 tablet 5   amLODipine (NORVASC) 2.5 MG tablet TAKE ONE TABLET BY MOUTH EVERY DAY 90 tablet 3   Ascorbic Acid (VITAMIN C) 1000 MG tablet Take 1,000 mg by mouth daily.     Biotin 01027 MCG TABS      Black Pepper-Turmeric (TURMERIC CURCUMIN) 09-998 MG CAPS      Cholecalciferol (VITAMIN D) 125 MCG (5000 UT) CAPS      citalopram (CELEXA) 20 MG tablet Take 1 tablet (20 mg total) by mouth daily. 90 tablet 3   cyclobenzaprine (FLEXERIL) 10 MG tablet TAKE 1 TABLET (10 MG TOTAL) BY MOUTH AT BEDTIME prn 30 tablet 5   MELOXICAM PO      Multiple  Vitamins-Minerals (WOMENS MULTIVITAMIN PLUS PO) Take by mouth once.     Omega-3 Fatty Acids (FISH OIL) 1000 MG CAPS Take by mouth.     omeprazole (PRILOSEC) 40 MG capsule Take 1 capsule (40 mg total) by mouth daily. 90 capsule 3   phentermine 15 MG capsule Take 15 mg by mouth every morning.     pyridOXINE (VITAMIN B-6) 100 MG tablet Take 100 mg by mouth daily.      TOPAMAX 25 MG tablet 25 mg.     ZEPBOUND 5 MG/0.5ML Pen Inject into the skin.     No current facility-administered medications on file prior to visit.    ROS see history of present illness  Objective  Physical Exam Vitals:   11/13/22 0839  BP: 130/82  Pulse: 74  Temp: 98.4 F (36.9 C)  SpO2: 99%    BP Readings from Last 3 Encounters:  11/13/22 130/82  05/10/22 132/84  02/14/22 138/82   Wt Readings from Last 3 Encounters:  11/13/22 146 lb 9.6 oz (66.5 kg)  05/10/22 169 lb 6.4 oz (76.8 kg)  02/14/22 182 lb (82.6 kg)    Physical Exam Constitutional:      General: She is not in acute distress.    Appearance: Normal appearance.  HENT:     Head: Normocephalic.     Right Ear: Tympanic membrane normal.     Left Ear: Tympanic membrane normal.  Nose: Nose normal.     Mouth/Throat:     Mouth: Mucous membranes are moist.     Pharynx: Oropharynx is clear.  Eyes:     Conjunctiva/sclera: Conjunctivae normal.     Pupils: Pupils are equal, round, and reactive to light.  Neck:     Thyroid: No thyromegaly.  Cardiovascular:     Rate and Rhythm: Normal rate and regular rhythm.     Heart sounds: Normal heart sounds.  Pulmonary:     Effort: Pulmonary effort is normal.     Breath sounds: Normal breath sounds.  Abdominal:     General: Abdomen is flat. Bowel sounds are normal.     Palpations: Abdomen is soft. There is no mass.     Tenderness: There is no abdominal tenderness.  Musculoskeletal:        General: Normal range of motion.  Lymphadenopathy:     Cervical: No cervical adenopathy.  Skin:    General: Skin  is warm and dry.     Findings: No rash.  Neurological:     General: No focal deficit present.     Mental Status: She is alert.  Psychiatric:        Mood and Affect: Mood normal.        Behavior: Behavior normal.     Assessment/Plan: Please see individual problem list.  Preventative health care Assessment & Plan: Physical exam complete. Lab results reviewed with patient. Pap- due in October, scheduled for 02/17/2023 with a new provider as hers retired. She is interested in a referral to a different office to establish with someone new. Referral placed. Colonoscopy- due, referral placed to GI. Mammogram- scheduled for tomorrow. Flu vaccine- UTD. Tetanus vaccine- UTD. Shingles vaccine- due, interested in getting, declined today. Encouraged to return for this or get at her local pharmacy when ready. Declined additional COVID vaccines. HIV and Hep C screenings negative. Recommended follow up with Dentist and establishing with Ophthalmology for annual exams. Encouraged to continue healthy diet and exercise. Return to care in one year, sooner PRN.    Screen for colon cancer -     Ambulatory referral to Gastroenterology  Screening for cervical cancer -     Ambulatory referral to Obstetrics / Gynecology   Return in about 1 year (around 11/13/2023) for Annual Exam, sooner PRN.   Bethanie Dicker, NP-C [facility] - [employer]

## 2022-11-13 ENCOUNTER — Ambulatory Visit: Payer: BC Managed Care – PPO | Admitting: Nurse Practitioner

## 2022-11-13 ENCOUNTER — Encounter: Payer: Self-pay | Admitting: Nurse Practitioner

## 2022-11-13 ENCOUNTER — Other Ambulatory Visit: Payer: Self-pay

## 2022-11-13 ENCOUNTER — Encounter: Payer: BC Managed Care – PPO | Admitting: Nurse Practitioner

## 2022-11-13 ENCOUNTER — Telehealth: Payer: Self-pay

## 2022-11-13 VITALS — BP 130/82 | HR 74 | Temp 98.4°F | Ht 63.0 in | Wt 146.6 lb

## 2022-11-13 DIAGNOSIS — Z1211 Encounter for screening for malignant neoplasm of colon: Secondary | ICD-10-CM

## 2022-11-13 DIAGNOSIS — Z124 Encounter for screening for malignant neoplasm of cervix: Secondary | ICD-10-CM

## 2022-11-13 DIAGNOSIS — Z0000 Encounter for general adult medical examination without abnormal findings: Secondary | ICD-10-CM

## 2022-11-13 NOTE — Telephone Encounter (Signed)
Gastroenterology Pre-Procedure Review  Request Date: 01/03/23 Requesting Physician: Dr. Allegra Lai  PATIENT REVIEW QUESTIONS: The patient responded to the following health history questions as indicated:    1. Are you having any GI issues? no 2. Do you have a personal history of Polyps? no 3. Do you have a family history of Colon Cancer or Polyps? yes (brother colon polyps, paternal uncle colon cancer) 4. Diabetes Mellitus? no 5. Joint replacements in the past 12 months?Left and right hip replacements in 2023 6. Major health problems in the past 3 months?no 7. Any artificial heart valves, MVP, or defibrillator?no    MEDICATIONS & ALLERGIES:    Patient reports the following regarding taking any anticoagulation/antiplatelet therapy:   Plavix, Coumadin, Eliquis, Xarelto, Lovenox, Pradaxa, Brilinta, or Effient? no Aspirin? no  Patient confirms/reports the following medications:  Current Outpatient Medications  Medication Sig Dispense Refill   ALPRAZolam (XANAX) 0.25 MG tablet Take 1 tablet (0.25 mg total) by mouth daily as needed for anxiety. 30 tablet 5   amLODipine (NORVASC) 2.5 MG tablet TAKE ONE TABLET BY MOUTH EVERY DAY 90 tablet 3   Ascorbic Acid (VITAMIN C) 1000 MG tablet Take 1,000 mg by mouth daily.     Biotin 40981 MCG TABS      Black Pepper-Turmeric (TURMERIC CURCUMIN) 09-998 MG CAPS      Cholecalciferol (VITAMIN D) 125 MCG (5000 UT) CAPS      citalopram (CELEXA) 20 MG tablet Take 1 tablet (20 mg total) by mouth daily. 90 tablet 3   cyclobenzaprine (FLEXERIL) 10 MG tablet TAKE 1 TABLET (10 MG TOTAL) BY MOUTH AT BEDTIME prn 30 tablet 5   MELOXICAM PO      Multiple Vitamins-Minerals (WOMENS MULTIVITAMIN PLUS PO) Take by mouth once.     Omega-3 Fatty Acids (FISH OIL) 1000 MG CAPS Take by mouth.     omeprazole (PRILOSEC) 40 MG capsule Take 1 capsule (40 mg total) by mouth daily. 90 capsule 3   phentermine 15 MG capsule Take 15 mg by mouth every morning.     pyridOXINE (VITAMIN B-6)  100 MG tablet Take 100 mg by mouth daily.      TOPAMAX 25 MG tablet 25 mg.     ZEPBOUND 5 MG/0.5ML Pen Inject into the skin.     No current facility-administered medications for this visit.    Patient confirms/reports the following allergies:  Allergies  Allergen Reactions   Effexor Xr [Venlafaxine Hcl]     Worsening anxiety, thoughts of wanting to leave this earth tapered off end 12/2020/01/2021   Oxycodone Other (See Comments)    No orders of the defined types were placed in this encounter.   AUTHORIZATION INFORMATION Primary Insurance: 1D#: Group #:  Secondary Insurance: 1D#: Group #:  SCHEDULE INFORMATION: Date: 01/03/23 Time: Location: [facility]

## 2022-11-13 NOTE — Assessment & Plan Note (Addendum)
Physical exam complete. Lab results reviewed with patient. Pap- due in October, scheduled for 02/17/2023 with a new provider as hers retired. She is interested in a referral to a different office to establish with someone new. Referral placed. Colonoscopy- due, referral placed to GI. Mammogram- scheduled for tomorrow. Flu vaccine- UTD. Tetanus vaccine- UTD. Shingles vaccine- due, interested in getting, declined today. Encouraged to return for this or get at her local pharmacy when ready. Declined additional COVID vaccines. HIV and Hep C screenings negative. Recommended follow up with Dentist and establishing with Ophthalmology for annual exams. Encouraged to continue healthy diet and exercise. Return to care in one year, sooner PRN.

## 2022-11-14 ENCOUNTER — Ambulatory Visit
Admission: RE | Admit: 2022-11-14 | Discharge: 2022-11-14 | Disposition: A | Discharge status: Home / Self Care | Payer: BC Managed Care – PPO | Source: Ambulatory Visit | Attending: General Surgery | Admitting: General Surgery

## 2022-11-14 DIAGNOSIS — Z1231 Encounter for screening mammogram for malignant neoplasm of breast: Secondary | ICD-10-CM

## 2022-11-14 MED ORDER — IOPAMIDOL (ISOVUE-370) INJECTION 76%
100.0000 mL | Freq: Once | INTRAVENOUS | Status: AC | PRN
  Administered 2022-11-14: 100 mL via INTRAVENOUS

## 2022-12-04 ENCOUNTER — Emergency Department (HOSPITAL_COMMUNITY)
Admission: EM | Admit: 2022-12-04 | Discharge: 2022-12-05 | Disposition: A | Discharge status: Home / Self Care | Payer: BC Managed Care – PPO | Attending: Emergency Medicine | Admitting: Emergency Medicine

## 2022-12-04 ENCOUNTER — Encounter: Payer: Self-pay | Admitting: Nurse Practitioner

## 2022-12-04 DIAGNOSIS — R61 Generalized hyperhidrosis: Secondary | ICD-10-CM | POA: Insufficient documentation

## 2022-12-04 DIAGNOSIS — I10 Essential (primary) hypertension: Secondary | ICD-10-CM | POA: Insufficient documentation

## 2022-12-04 DIAGNOSIS — R110 Nausea: Secondary | ICD-10-CM | POA: Insufficient documentation

## 2022-12-04 DIAGNOSIS — Z79899 Other long term (current) drug therapy: Secondary | ICD-10-CM | POA: Diagnosis not present

## 2022-12-04 DIAGNOSIS — M25512 Pain in left shoulder: Secondary | ICD-10-CM | POA: Diagnosis present

## 2022-12-04 NOTE — Telephone Encounter (Signed)
Patient states she has been messaging Bethanie Dicker, NP's nurse and she is calling to schedule an appointment.  Patient states while she has been on the phone with me, she has experienced a quick pain in her right temple.  Patient states this has happened twice during our conversation.  I transferred call to Access Nurse.

## 2022-12-05 ENCOUNTER — Encounter (HOSPITAL_COMMUNITY): Payer: Self-pay | Admitting: Emergency Medicine

## 2022-12-05 ENCOUNTER — Other Ambulatory Visit: Payer: Self-pay

## 2022-12-05 ENCOUNTER — Emergency Department (HOSPITAL_COMMUNITY): Payer: BC Managed Care – PPO

## 2022-12-05 DIAGNOSIS — M25512 Pain in left shoulder: Secondary | ICD-10-CM | POA: Diagnosis not present

## 2022-12-05 LAB — CBC
HCT: 37.6 % (ref 36.0–46.0)
Hemoglobin: 12.8 g/dL (ref 12.0–15.0)
MCH: 29.8 pg (ref 26.0–34.0)
MCHC: 34 g/dL (ref 30.0–36.0)
MCV: 87.6 fL (ref 80.0–100.0)
Platelets: 258 10*3/uL (ref 150–400)
RBC: 4.29 MIL/uL (ref 3.87–5.11)
RDW: 12.5 % (ref 11.5–15.5)
WBC: 5.8 10*3/uL (ref 4.0–10.5)
nRBC: 0 % (ref 0.0–0.2)

## 2022-12-05 LAB — TROPONIN I (HIGH SENSITIVITY)
Troponin I (High Sensitivity): 3 ng/L (ref <18)
Troponin I (High Sensitivity): 3 ng/L (ref <18)

## 2022-12-05 LAB — BASIC METABOLIC PANEL
Anion gap: 9 (ref 5–15)
BUN: 10 mg/dL (ref 6–20)
CO2: 27 mmol/L (ref 22–32)
Calcium: 9.4 mg/dL (ref 8.9–10.3)
Chloride: 99 mmol/L (ref 98–111)
Creatinine, Ser: 0.78 mg/dL (ref 0.44–1.00)
GFR, Estimated: >60 mL/min (ref >60)
Glucose, Bld: 111 mg/dL — ABNORMAL HIGH (ref 70–99)
Potassium: 3.4 mmol/L — ABNORMAL LOW (ref 3.5–5.1)
Sodium: 135 mmol/L (ref 135–145)

## 2022-12-05 MED ORDER — ASPIRIN 81 MG PO CHEW: 81.0000 mg | CHEWABLE_TABLET | Freq: Every day | ORAL | 0 refills | Status: DC

## 2022-12-05 MED ORDER — NITROGLYCERIN 0.4 MG SL SUBL: 0.4000 mg | SUBLINGUAL_TABLET | SUBLINGUAL | 0 refills | Status: DC | PRN

## 2022-12-05 MED ORDER — ASPIRIN 81 MG PO CHEW
324.0000 mg | CHEWABLE_TABLET | Freq: Once | ORAL | Status: AC
  Administered 2022-12-05: 324 mg via ORAL
  Filled 2022-12-05: qty 4

## 2022-12-05 MED ORDER — IOHEXOL 350 MG/ML SOLN
75.0000 mL | Freq: Once | INTRAVENOUS | Status: AC | PRN
  Administered 2022-12-05: 75 mL via INTRAVENOUS

## 2022-12-05 NOTE — ED Provider Notes (Signed)
[facility] AT [facility] Provider Note   CSN: [id number] Arrival date & time: 12/04/22  2336     History  Chief Complaint  Patient presents with   Arm Tingling   Breast Pain    Yvonne Moore is a 62 y.o. female.  Patient with a history of hypertension, GERD, previous C-spine surgery presenting with episodes of severe shoulder pain intermittent for the past 2 nights that disturb her sleep.  Denies any fall or trauma.  Today throughout the day she had 2 episodes of severe pain radiating from her left shoulder down to her elbow and down to her fingertips.  These were associated with crushing pain to her shoulder with nausea and diaphoresis.  Symptoms lasted 15 to 20 minutes each and resolved on its own.  Denies specific chest pain with these episodes but describes pain severe into her left shoulder and upper back area.  Pain-free now.  Pain not brought on by any certain type of activity.  She is never had this pain before.  No associated shortness of breath, cough or fever.  Was nauseous and diaphoretic.  Denies any cardiac history.  She is never had a stress test.  Additionally describes "intermittent bursts" in her right head throughout the day today that come and go lasting for just a second at a time.  Estimates she has had about 15 or 20 these episodes for just a second or 2 and then immediately resolves.  This is not associated with the arm pain and sweating and nausea.  She is symptom-free currently.  Reports had pain in her right arm several years ago before she had C-spine surgery.  Initially thought this could be happening on the left side as well but does not have the pain in the neck and the pain is intermittent coming and going.  It is associated with nausea and sweating which is atypical for her neck pain  The history is provided by the patient and the spouse.       Home Medications Prior to Admission medications   Medication Sig Start  Date End Date Taking? Authorizing Provider  ALPRAZolam (XANAX) 0.25 MG tablet Take 1 tablet (0.25 mg total) by mouth daily as needed for anxiety. 02/13/22   McLean-Scocuzza, Pasty Spillers, MD  amLODipine (NORVASC) 2.5 MG tablet TAKE ONE TABLET BY MOUTH EVERY DAY 11/06/22   Bethanie Dicker, NP  Ascorbic Acid (VITAMIN C) 1000 MG tablet Take 1,000 mg by mouth daily.    [provider]  Biotin 40981 MCG TABS  10/04/19   [provider]  Black Pepper-Turmeric (TURMERIC CURCUMIN) 09-998 MG CAPS     [provider]  Cholecalciferol (VITAMIN D) 125 MCG (5000 UT) CAPS  02/03/19   [provider]  citalopram (CELEXA) 20 MG tablet Take 1 tablet (20 mg total) by mouth daily. 02/13/22   McLean-Scocuzza, Pasty Spillers, MD  cyclobenzaprine (FLEXERIL) 10 MG tablet TAKE 1 TABLET (10 MG TOTAL) BY MOUTH AT BEDTIME prn 03/22/21   McLean-Scocuzza, Pasty Spillers, MD  MELOXICAM PO  11/20/21   [provider]  Multiple Vitamins-Minerals (WOMENS MULTIVITAMIN PLUS PO) Take by mouth once.    [provider]  Omega-3 Fatty Acids (FISH OIL) 1000 MG CAPS Take by mouth.    [provider]  omeprazole (PRILOSEC) 40 MG capsule Take 1 capsule (40 mg total) by mouth daily. 05/10/22   Bethanie Dicker, NP  phentermine 15 MG capsule Take 15 mg by mouth every morning. 03/06/22  [provider]  pyridOXINE (VITAMIN B-6) 100 MG tablet Take 100 mg by mouth daily.     [provider]  TOPAMAX 25 MG tablet 25 mg. 10/16/22   [provider]  ZEPBOUND 5 MG/0.5ML Pen Inject into the skin. 11/06/22   [provider]      Allergies    Effexor xr [venlafaxine hcl] and Oxycodone    Review of Systems   Review of Systems  Constitutional:  Negative for activity change, appetite change and fever.  HENT:  Negative for congestion.   Respiratory:  Positive for chest tightness. Negative for shortness of breath.   Cardiovascular:  Negative for chest pain.  Genitourinary:  Negative  for dysuria and hematuria.  Neurological:  Positive for headaches. Negative for dizziness and weakness. Seizures: .ro. Speech difficulty: .ro.   all other systems are negative except as noted in the HPI and PMH.   Physical Exam Updated Vital Signs BP (!) 151/85 (BP Location: Right Arm)   Pulse 67   Temp 97.9 F (36.6 C) (Oral)   Resp 17   Ht 5\' 3"  (1.6 m)   Wt 60.8 kg   LMP 06/08/2016 (Exact Date) Comment: spotted in May for 2 days  SpO2 100%   BMI 23.74 kg/m  Physical Exam Vitals and nursing note reviewed.  Constitutional:      General: She is not in acute distress.    Appearance: She is well-developed.  HENT:     Head: Normocephalic and atraumatic.     Mouth/Throat:     Pharynx: No oropharyngeal exudate.  Eyes:     Conjunctiva/sclera: Conjunctivae normal.     Pupils: Pupils are equal, round, and reactive to light.  Neck:     Comments: No meningismus. Cardiovascular:     Rate and Rhythm: Normal rate and regular rhythm.     Heart sounds: Normal heart sounds. No murmur heard. Pulmonary:     Effort: Pulmonary effort is normal. No respiratory distress.     Breath sounds: Normal breath sounds.  Abdominal:     Palpations: Abdomen is soft.     Tenderness: There is no abdominal tenderness. There is no guarding or rebound.  Musculoskeletal:        General: No tenderness. Normal range of motion.     Cervical back: Normal range of motion and neck supple.     Comments: Equal grip strength bilaterally.  Equal radial pulses  Skin:    General: Skin is warm.  Neurological:     Mental Status: She is alert and oriented to person, place, and time.     Cranial Nerves: No cranial nerve deficit.     Motor: No abnormal muscle tone.     Coordination: Coordination normal.     Comments:  5/5 strength throughout. CN 2-12 intact.Equal grip strength.   Psychiatric:        Behavior: Behavior normal.     ED Results / Procedures / Treatments   Labs (all labs ordered are listed, but only  abnormal results are displayed) Labs Reviewed  BASIC METABOLIC PANEL - Abnormal; Notable for the following components:      Result Value   Potassium 3.4 (*)    Glucose, Bld 111 (*)    All other components within normal limits  CBC  TROPONIN I (HIGH SENSITIVITY)  TROPONIN I (HIGH SENSITIVITY)    EKG EKG Interpretation Date/Time:  Thursday December 05 2022 00:04:13 EDT Ventricular Rate:  71 PR Interval:  176 QRS Duration:  86  QT Interval:  388 QTC Calculation: 421 R Axis:   78  Text Interpretation: Normal sinus rhythm Normal ECG When compared with ECG of 06-Jan-2004 10:09, PREVIOUS ECG IS PRESENT No significant change was found Confirmed by Glynn Octave [phone]) on 12/05/2022 1:14:02 AM  Radiology CT ANGIO HEAD NECK W WO CM  Result Date: 12/05/2022 CLINICAL DATA:  Initial evaluation for neuro deficit, stroke. EXAM: CT ANGIOGRAPHY HEAD AND NECK WITH AND WITHOUT CONTRAST TECHNIQUE: Multidetector CT imaging of the head and neck was performed using the standard protocol during bolus administration of intravenous contrast. Multiplanar CT image reconstructions and MIPs were obtained to evaluate the vascular anatomy. Carotid stenosis measurements (when applicable) are obtained utilizing NASCET criteria, using the distal internal carotid diameter as the denominator. RADIATION DOSE REDUCTION: This exam was performed according to the departmental dose-optimization program which includes automated exposure control, adjustment of the mA and/or kV according to patient size and/or use of iterative reconstruction technique. CONTRAST:  75mL OMNIPAQUE IOHEXOL 350 MG/ML SOLN COMPARISON:  None Available. FINDINGS: CT HEAD FINDINGS Brain: Cerebral volume within normal limits. Scattered patchy hypodensity involving the supratentorial cerebral white matter, nonspecific, but most like related chronic microvascular ischemic disease. No acute intracranial hemorrhage. No acute large vessel territory infarct. No mass  lesion or midline shift. No hydrocephalus or extra-axial fluid collection. Vascular: No abnormal hyperdense vessel. Skull: Scalp soft tissues and calvarium demonstrate no acute or significant finding. Sinuses/Orbits: Globes and orbital soft tissues within normal limits. Paranasal sinuses and mastoid air cells are clear. Other: None. Review of the MIP images confirms the above findings CTA NECK FINDINGS Aortic arch: Standard branching. Imaged portion shows no evidence of aneurysm or dissection. No significant stenosis of the major arch vessel origins. Right carotid system: No evidence of dissection, stenosis (50% or greater), or occlusion. Left carotid system: No evidence of dissection, stenosis (50% or greater), or occlusion. Vertebral arteries: Both vertebral arteries arise from the subclavian arteries. No proximal subclavian artery stenosis. Left vertebral artery slightly dominant. No stenosis or dissection. Skeleton: No discrete or worrisome osseous lesions. Prior ACDF at C4 through C7 with solid arthrodesis. 4 mm anterolisthesis of C7 on T1. Other neck: No other acute finding. Upper chest: No other acute finding. Review of the MIP images confirms the above findings CTA HEAD FINDINGS Anterior circulation: Both internal carotid arteries are widely patent without stenosis. A1 segments, anterior King artery complex common anterior cerebral arteries widely patent. No M1 stenosis or occlusion. Distal MCA branches well perfused and symmetric. Posterior circulation: Both V4 segments patent without stenosis. Right PICA patent. Left PICA not well seen. Basilar widely patent without stenosis. Superior cerebellar and posterior cerebral arteries patent bilaterally. Venous sinuses: Patent allowing for timing the contrast bolus. Anatomic variants: None significant.  No aneurysm. Review of the MIP images confirms the above findings IMPRESSION: CT HEAD IMPRESSION: 1. No acute intracranial abnormality. 2. Mild-to-moderate chronic  microvascular ischemic disease. CTA HEAD AND NECK IMPRESSION: 1. Negative CTA of the head and neck. No large vessel occlusion or other emergent finding. No hemodynamically significant or correctable stenosis. 2. Prior ACDF at C4 through C7 with solid arthrodesis. Electronically Signed   By: Rise Mu M.D.   On: 12/05/2022 03:07   DG Chest 2 View  Result Date: 12/05/2022 CLINICAL DATA:  Chest pain. EXAM: CHEST - 2 VIEW COMPARISON:  None Available. FINDINGS: The heart size and mediastinal contours are within normal limits. Both lungs are clear. The visualized skeletal structures are unremarkable. IMPRESSION: No active cardiopulmonary disease.  Electronically Signed   By: Elgie Collard M.D.   On: 12/05/2022 00:24    Procedures Procedures    Medications Ordered in ED Medications  aspirin chewable tablet 324 mg (has no administration in time range)    ED Course/ Medical Decision Making/ A&P                                 Medical Decision Making Amount and/or Complexity of Data Reviewed Labs: ordered. Decision-making details documented in ED Course. Radiology: ordered and independent interpretation performed. Decision-making details documented in ED Course. ECG/medicine tests: ordered and independent interpretation performed. Decision-making details documented in ED Course.  Risk OTC drugs. Prescription drug management. Decision regarding hospitalization.   2 days of intermittent left shoulder pain associate with nausea and diaphoresis.  No frank chest pain.  EKG shows no acute ischemia.  No neurological deficits on exam.  Concern for possible anginal equivalent.  Troponin is negative.  No neurological deficits.  CTA is obtained given her description of "burst" in her head that comes and goes.  This is negative for aneurysm.  Does show stable cervical spine repair.  No new findings to suggest new cervical radiculopathy.  Troponin negative x 2.  It is concerning the patient  has exertional left shoulder pain with shortness of breath and nausea and diaphoresis that could be anginal equivalent.  Will plan observation admission for possible stress test.  Discussed with Dr. Imogene Burn who accepts patient.  Will consult cardiology for stress test.  Low suspicion for aortic dissection or pulmonary embolism.  Patient discussed with Dr. Imogene Burn.  He was able to find that patient had a CT coronary calcium study in June that had a 0 calcium score. He does not feel patient needs to be admitted Recommends Discussion with cardiology.  Discussed with Dr. Odis Hollingshead of cardiology.  He offered patient inpatient stress test which she prefers to schedule as an outpatient.  It is reassuring that her calcium score is 0.  Dr. Odis Hollingshead recommend starting aspirin and having nitroglycerin as needed for home use.  Patient able to walk on her treadmill 6 days a week for over an hour without difficulty.  Low suspicion for obstructive coronary disease. Especially in setting of recent negative calcium score.   Recommended to taper off her phentermine.  Patient and husband comfortable with discharge home.  They know to expect a call from the cardiology office to set up a stress test.  Aspirin and nitroglycerin started per cardiology recommendations.  Return to the ED with worsening episodes of these including exertional chest pain, pain associate with shortness of breath, nausea, vomiting, sweating or any other concerns.       Final Clinical Impression(s) / ED Diagnoses Final diagnoses:  Acute pain of left shoulder    Rx / DC Orders ED Discharge Orders     None         Sophiamarie Nease, Jeannett Senior, MD 12/05/22 [phone]

## 2022-12-05 NOTE — Progress Notes (Signed)
ON-CALL CARDIOLOGY 12/05/22  Patient's name: Yvonne Moore.   MRN: [id number].    DOB: 1961/01/12 Primary care provider: Bethanie Dicker, NP.   Interaction regarding this patient's care today: Patient has been experiencing left shoulder pain that radiates from her left shoulder down to her elbow/fingertips.  She has a history of C-spine surgery with severe shoulder pain in the past.  Also the contributing factors include MSK discomfort after rolling her grandson.  She had discomfort on 12/02/2022 along with 2 episodes on 12/04/2022 which prompted the ER visit.  High sensitive troponins are negative x 2.  EKG is nonischemic.  Currently chest pain-free.  No change in overall physical endurance.   No chest pain with walking 6 miles. No classic anginal discomfort.     Latest Ref Rng & Units 12/05/2022   12:13 AM 11/11/2022    7:58 AM 12/17/2021    9:33 AM  BMP  Glucose 70 - 99 mg/dL 409  92  88   BUN 6 - 20 mg/dL 10  12  13    Creatinine 0.44 - 1.00 mg/dL 8.11  9.14  7.82   Sodium 135 - 145 mmol/L 135  139  140   Potassium 3.5 - 5.1 mmol/L 3.4  4.3  4.3   Chloride 98 - 111 mmol/L 99  103  103   CO2 22 - 32 mmol/L 27  29  28    Calcium 8.9 - 10.3 mg/dL 9.4  9.4  9.3       Latest Ref Rng & Units 12/05/2022   12:13 AM 11/11/2022    7:58 AM 02/13/2022    9:38 AM  CBC  WBC 4.0 - 10.5 K/uL 5.8  4.5  5.3   Hemoglobin 12.0 - 15.0 g/dL 95.6  21.3  08.6   Hematocrit 36.0 - 46.0 % 37.6  39.6  38.3   Platelets 150 - 400 K/uL 258  254.0  194.0    Cardiac Panel (last 3 results) 12/05/2022 Hs troponin 3 and 3  Impression: Left shoulder pain  Nausea   Recommendations: Symptoms not suggestive of ACS.  CAC 0  Hs troponin neg x2  EKG non ischemic  No pain w/ walking 6 miles but has been having left shoulder pain, nausea, diaphoresis per ER physician  Other factors to consider: Hx of C-spine radicular pain, hold grandson ? MSK pain   CTA head and neck results reviewed.   NSAIDs or  Tylenol  RICE  C-spine radiculopathy workup w/ PCP  If she can wean off phentermine - recommend it. She is on phentermine, topomax, and Zepbound  Will to keep her in observation and do stress test or CCTA - based on availability.  Otherwise for reasons mentioned above outpatient workup is reasonable as well.  Will defer to patient's wishes.   Dr. Manus Gunning discussed w/ patient she would like outpatient follow up.  Until the workup is complete  ASA 81mg  po qday + Nito tabs for prn.  Reasons to come back to ED discussed by ED provider.  Echo as outpt CCTA or stress outpatient based on insurance approval.   Telephone total time: 17 minutes.   Tessa Lerner, [place], [facility]  Pager:  [phone] Office: [phone]

## 2022-12-05 NOTE — Subjective & Objective (Signed)
CC: left shoulder pain HPI: 62 year old female with a history of obese city who has been on phentermine for the last year, recently started Zepbound about 1 month ago, and Topamax about 2 months ago, hypertension, presents to the ER today with a 3-day history of episodic left shoulder pain with radiation down her elbow.  Patient never has had any chest pain.  She describes some nausea and sweating associated with her arm pain.  She denies any shortness of breath.  First episode was on Monday night.  She states that it lasted for several hours.  She could not sleep.  She initially thought that she may have carried her grandson the wrong way.  She finally fell back asleep.  She had her second episode on Wednesday morning the lasted a few minutes.  She ended up having another episode on Wednesday evening and came to the ER for evaluation.  Patient had a CT cardiac calcium score done on October 22, 2022 with a score of 0.  Patient also walks 6 days a week for about 45 to 60 minutes at a time.  She states that she walks at a very fast pace.  She states that she is dripping with sweat at the end of her walk.  She is walking for exercise.  She never has any chest pain or shortness of breath while walking.  In the ER temp 97.9 heart rate 72 blood pressure 141/76.  White count 5.8, hemoglobin 12.8, platelets of 258  Sodium 135, Tessman 3.4, BUN of 10, creatinine 0.78  Troponin negative x 2 sets.  EKG shows normal sinus rhythm without any acute ST changes.  [facility] hospitalist consulted.

## 2022-12-05 NOTE — ED Triage Notes (Signed)
Patient with L arm numbness, pain and tingling from shoulder to fingers intermittently happening since Saturday along with pain around the left breast. States this goes on for 10-15 minutes and feels diaphoretic, hot, nauseated. Is currently on phentermine, topimax and zepbound for weightloss however no issues since starting each of these medications. Hx of high BP.

## 2022-12-05 NOTE — Assessment & Plan Note (Signed)
Doubt this is cardiac in origin. Pt with CT coronary calcium study in 10-2022 that was 0.  Pt also walks 6 days a week for 45-60 mins. Pt states she is walking for exercise and walks fast. She states she is sweating by the end of her walk. She never has any chest pain or SOB with exercise. I don't believe that hospital-based cardiac stress testing would benefit her.  Defer to cardiology to decide if pt requires in-hospital stress testing.  Advised patient that it is time to stop phentermine. Pt has been on it for almost 1 year. Pt recently started Zepbound last month. She also started Topamax about 2 months. She states she has not noted any different in her appetite with starting Topamax. Advised her to talk to her weight-loss provider about stopping both phentermine and Topamax.

## 2022-12-05 NOTE — Consult Note (Signed)
History and Physical    Yvonne Moore UJW:[id number] DOB: 1960/11/22 DOA: 12/04/2022  DOS: the patient was seen and examined on 12/04/2022  PCP: Bethanie Dicker, NP   Patient coming from: Home  I have personally briefly reviewed patient's old medical records in [facility]  CC: left shoulder pain HPI: 62 year old female with a history of obese city who has been on phentermine for the last year, recently started Zepbound about 1 month ago, and Topamax about 2 months ago, hypertension, presents to the ER today with a 3-day history of episodic left shoulder pain with radiation down her elbow.  Patient never has had any chest pain.  She describes some nausea and sweating associated with her arm pain.  She denies any shortness of breath.  First episode was on Monday night.  She states that it lasted for several hours.  She could not sleep.  She initially thought that she may have carried her grandson the wrong way.  She finally fell back asleep.  She had her second episode on Wednesday morning the lasted a few minutes.  She ended up having another episode on Wednesday evening and came to the ER for evaluation.  Patient had a CT cardiac calcium score done on October 22, 2022 with a score of 0.  Patient also walks 6 days a week for about 45 to 60 minutes at a time.  She states that she walks at a very fast pace.  She states that she is dripping with sweat at the end of her walk.  She is walking for exercise.  She never has any chest pain or shortness of breath while walking.  In the ER temp 97.9 heart rate 72 blood pressure 141/76.  White count 5.8, hemoglobin 12.8, platelets of 258  Sodium 135, Tessman 3.4, BUN of 10, creatinine 0.78  Troponin negative x 2 sets.  EKG shows normal sinus rhythm without any acute ST changes.  [facility] hospital[facility] consulted.   ED Course: troponin negative x 2 sets. EKG shows NSR  Review of Systems:  Review of Systems  Constitutional: Negative.   HENT:  Negative.    Eyes: Negative.   Respiratory: Negative.    Cardiovascular:  Negative for chest pain.  Gastrointestinal: Negative.   Genitourinary: Negative.   Musculoskeletal:        Left shoulder pain with radiation to left elbow. Associated with sweating and nausea. But no chest pain or SOB.  Skin: Negative.   Neurological: Negative.   Endo/Heme/Allergies:        Intentional 40 lbs weight loss in 10 months.  Psychiatric/Behavioral: Negative.    All other systems reviewed and are negative.   Past Medical History:  Diagnosis Date   Anxiety    Arthritis    hips, low back   Breast mass, right    Chicken pox    COVID    end 01/2021   Depression    GERD (gastroesophageal reflux disease)    Plantar fasciitis    PONV (postoperative nausea and vomiting)    Vertigo     Past Surgical History:  Procedure Laterality Date   BREAST BIOPSY Right 10/17/2015   stereo/atypical ductal hyperplasia   BREAST BIOPSY Left 11/24/2017   x marker, PSEUDO-ANGIOMATOUS STROMAL HYPERPLASIA   BREAST EXCISIONAL BIOPSY Right 2017   atypical ductal hyperplasia excisied   BREAST SURGERY Bilateral 2010   Reduction    CARPAL TUNNEL RELEASE     Unsure of date and which laterality    CERVICAL SPINE  SURGERY  2012   Discectomy and fusion of C4,5, and 6 Dr. Gretta Began    HIP ARTHROPLASTY Bilateral    Dr. Odis L[facility] since 07/2021   LASIK Bilateral 2001   RADIOACTIVE SEED GUIDED EXCISIONAL BREAST BIOPSY Right 12/04/2015   Procedure: RADIOACTIVE SEED GUIDED EXCISIONAL BREAST BIOPSY;  Surgeon: Emelia Loron, MD;  Location: [facility];  Service: General;  Laterality: Right;   REDUCTION MAMMAPLASTY Bilateral 2010   REVISION TOTAL HIP ARTHROPLASTY Bilateral 2023   SHOULDER SURGERY Left 1982   bone spur removed/ bone separation repair   thumb joint replacement     L thumb Dr. Amanda Pea    TONSILLECTOMY AND ADENOIDECTOMY  1967     reports that she has quit smoking. Her smoking use  included cigarettes. She has never used smokeless tobacco. She reports current alcohol use. She reports that she does not use drugs.  Allergies  Allergen Reactions   Effexor Xr [Venlafaxine Hcl]     Worsening anxiety, thoughts of wanting to leave this earth tapered off end 12/2020/01/2021   Oxycodone Other (See Comments)    Family History  Problem Relation Age of Onset   Arthritis Mother    Hypertension Mother    Kidney disease Mother    Alcohol abuse Father    Arthritis Father    Lung cancer Father 20   Hypertension Father    Alcohol abuse Brother    Hypertension Brother    Colon polyps Brother    Lung cancer Maternal Grandmother    Hypertension Maternal Grandmother    Arthritis Maternal Aunt    Arthritis Maternal Uncle    Lung cancer Maternal Uncle    Bladder Cancer Maternal Uncle    Colon cancer Paternal Uncle        x5 "Lynch+"   Leukemia Paternal Uncle    Lung cancer Paternal Uncle    Rectal cancer Cousin        vs anal   Breast cancer Cousin        dx 45s   Colon cancer Cousin        dx 35s; "Lynch +"   Melanoma Cousin    Bladder Cancer Cousin    Breast cancer Cousin        dx 39s   Other Other        lynch    Prior to Admission medications   Medication Sig Start Date End Date Taking? Authorizing Provider  ALPRAZolam (XANAX) 0.25 MG tablet Take 1 tablet (0.25 mg total) by mouth daily as needed for anxiety. 02/13/22   McLean-Scocuzza, Pasty Spillers, MD  amLODipine (NORVASC) 2.5 MG tablet TAKE ONE TABLET BY MOUTH EVERY DAY 11/06/22   Bethanie Dicker, NP  Ascorbic Acid (VITAMIN C) 1000 MG tablet Take 1,000 mg by mouth daily.    [provider]  Biotin 32440 MCG TABS  10/04/19   [provider]  Black Pepper-Turmeric (TURMERIC CURCUMIN) 09-998 MG CAPS     [provider]  Cholecalciferol (VITAMIN D) 125 MCG (5000 UT) CAPS  02/03/19   [provider]  citalopram (CELEXA) 20 MG tablet Take 1 tablet (20 mg total) by mouth daily. 02/13/22    McLean-Scocuzza, Pasty Spillers, MD  cyclobenzaprine (FLEXERIL) 10 MG tablet TAKE 1 TABLET (10 MG TOTAL) BY MOUTH AT BEDTIME prn 03/22/21   McLean-Scocuzza, Pasty Spillers, MD  MELOXICAM PO  11/20/21   [provider]  Multiple Vitamins-Minerals (WOMENS MULTIVITAMIN PLUS PO) Take by mouth once.  [provider]  Omega-3 Fatty Acids (FISH OIL) 1000 MG CAPS Take by mouth.    [provider]  omeprazole (PRILOSEC) 40 MG capsule Take 1 capsule (40 mg total) by mouth daily. 05/10/22   Bethanie Dicker, NP  phentermine 15 MG capsule Take 15 mg by mouth every morning. 03/06/22   [provider]  pyridOXINE (VITAMIN B-6) 100 MG tablet Take 100 mg by mouth daily.     [provider]  TOPAMAX 25 MG tablet 25 mg. 10/16/22   [provider]  ZEPBOUND 5 MG/0.5ML Pen Inject into the skin. 11/06/22   [provider]    Physical Exam: Vitals:   12/05/22 0200 12/05/22 0240 12/05/22 0300 12/05/22 0400  BP: (!) 141/92 139/82 124/77 132/82  Pulse: 69 65 69 61  Resp: 16 10 13 11   Temp:      TempSrc:      SpO2: 100% 95% 100% 100%  Weight:      Height:        Physical Exam Vitals and nursing note reviewed.  Constitutional:      General: She is not in acute distress.    Appearance: Normal appearance. She is normal weight. She is not toxic-appearing or diaphoretic.  HENT:     Head: Normocephalic and atraumatic.     Nose: Nose normal.  Eyes:     General: No scleral icterus. Cardiovascular:     Rate and Rhythm: Normal rate and regular rhythm.     Pulses: Normal pulses.  Pulmonary:     Effort: Pulmonary effort is normal.     Breath sounds: Normal breath sounds.  Abdominal:     General: Abdomen is flat. Bowel sounds are normal. There is no distension.     Palpations: Abdomen is soft.  Musculoskeletal:     Left shoulder: Crepitus present.     Comments: +crepitus with left shoulder in 90 degrees abduction and 90 degrees flexion. Did not recreate pain.  Skin:     General: Skin is warm and dry.     Capillary Refill: Capillary refill takes less than 2 seconds.  Neurological:     General: No focal deficit present.     Mental Status: She is alert and oriented to person, place, and time.      Labs on Admission: I have personally reviewed following labs and imaging studies  CBC: Recent Labs  Lab 12/05/22 0013  WBC 5.8  HGB 12.8  HCT 37.6  MCV 87.6  PLT 258   Basic Metabolic Panel: Recent Labs  Lab 12/05/22 0013  NA 135  K 3.4*  CL 99  CO2 27  GLUCOSE 111*  BUN 10  CREATININE 0.78  CALCIUM 9.4   GFR: Estimated Creatinine Clearance: 61.9 mL/min (by C-G formula based on SCr of 0.78 mg/dL). Cardiac Enzymes: Recent Labs  Lab 12/05/22 0013 12/05/22 0153  TROPONINIHS 3 3   Radiological Exams on Admission: I have personally reviewed images CT ANGIO HEAD NECK W WO CM  Result Date: 12/05/2022 CLINICAL DATA:  Initial evaluation for neuro deficit, stroke. EXAM: CT ANGIOGRAPHY HEAD AND NECK WITH AND WITHOUT CONTRAST TECHNIQUE: Multidetector CT imaging of the head and neck was performed using the standard protocol during bolus administration of intravenous contrast. Multiplanar CT image reconstructions and MIPs were obtained to evaluate the vascular anatomy. Carotid stenosis measurements (when applicable) are obtained utilizing NASCET criteria, using the distal internal carotid diameter as the denominator. RADIATION DOSE REDUCTION: This exam was performed according to the departmental  dose-optimization program which includes automated exposure control, adjustment of the mA and/or kV according to patient size and/or use of iterative reconstruction technique. CONTRAST:  75mL OMNIPAQUE IOHEXOL 350 MG/ML SOLN COMPARISON:  None Available. FINDINGS: CT HEAD FINDINGS Brain: Cerebral volume within normal limits. Scattered patchy hypodensity involving the supratentorial cerebral white matter, nonspecific, but most like related chronic microvascular ischemic  disease. No acute intracranial hemorrhage. No acute large vessel territory infarct. No mass lesion or midline shift. No hydrocephalus or extra-axial fluid collection. Vascular: No abnormal hyperdense vessel. Skull: Scalp soft tissues and calvarium demonstrate no acute or significant finding. Sinuses/Orbits: Globes and orbital soft tissues within normal limits. Paranasal sinuses and mastoid air cells are clear. Other: None. Review of the MIP images confirms the above findings CTA NECK FINDINGS Aortic arch: Standard branching. Imaged portion shows no evidence of aneurysm or dissection. No significant stenosis of the major arch vessel origins. Right carotid system: No evidence of dissection, stenosis (50% or greater), or occlusion. Left carotid system: No evidence of dissection, stenosis (50% or greater), or occlusion. Vertebral arteries: Both vertebral arteries arise from the subclavian arteries. No proximal subclavian artery stenosis. Left vertebral artery slightly dominant. No stenosis or dissection. Skeleton: No discrete or worrisome osseous lesions. Prior ACDF at C4 through C7 with solid arthrodesis. 4 mm anterolisthesis of C7 on T1. Other neck: No other acute finding. Upper chest: No other acute finding. Review of the MIP images confirms the above findings CTA HEAD FINDINGS Anterior circulation: Both internal carotid arteries are widely patent without stenosis. A1 segments, anterior King artery complex common anterior cerebral arteries widely patent. No M1 stenosis or occlusion. Distal MCA branches well perfused and symmetric. Posterior circulation: Both V4 segments patent without stenosis. Right PICA patent. Left PICA not well seen. Basilar widely patent without stenosis. Superior cerebellar and posterior cerebral arteries patent bilaterally. Venous sinuses: Patent allowing for timing the contrast bolus. Anatomic variants: None significant.  No aneurysm. Review of the MIP images confirms the above findings  IMPRESSION: CT HEAD IMPRESSION: 1. No acute intracranial abnormality. 2. Mild-to-moderate chronic microvascular ischemic disease. CTA HEAD AND NECK IMPRESSION: 1. Negative CTA of the head and neck. No large vessel occlusion or other emergent finding. No hemodynamically significant or correctable stenosis. 2. Prior ACDF at C4 through C7 with solid arthrodesis. Electronically Signed   By: Rise Mu M.D.   On: 12/05/2022 03:07   DG Chest 2 View  Result Date: 12/05/2022 CLINICAL DATA:  Chest pain. EXAM: CHEST - 2 VIEW COMPARISON:  None Available. FINDINGS: The heart size and mediastinal contours are within normal limits. Both lungs are clear. The visualized skeletal structures are unremarkable. IMPRESSION: No active cardiopulmonary disease. Electronically Signed   By: Elgie Collard M.D.   On: 12/05/2022 00:24    EKG: My personal interpretation of EKG shows: NSR     Assessment/Plan Active Problems:   Left shoulder pain    Assessment and Plan: Left shoulder pain Doubt this is cardiac in origin. Pt with CT coronary calcium study in 10-2022 that was 0.  Pt also walks 6 days a week for 45-60 mins. Pt states she is walking for exercise and walks fast. She states she is sweating by the end of her walk. She never has any chest pain or SOB with exercise. I don't believe that hospital-based cardiac stress testing would benefit her.  Defer to cardiology to decide if pt requires in-hospital stress testing.  Advised patient that it is time to stop phentermine. Pt has been  on it for almost 1 year. Pt recently started Zepbound last month. She also started Topamax about 2 months. She states she has not noted any different in her appetite with starting Topamax. Advised her to talk to her weight-loss provider about stopping both phentermine and Topamax.   Consults called: EDP to consult cardiology  Admission status:  admission declined ,  discussed with EDP   Carollee Herter, DO [facility] 12/05/2022, 4:44 AM

## 2022-12-05 NOTE — Discharge Instructions (Signed)
As we discussed, stress test is recommended for further evaluation of your shoulder pain with sweating and nausea.  Dr. Emelda Brothers office should call you to arrange appointment this week.  start taking a baby aspirin daily.  You may use nitroglycerin as needed for severe episodes of shoulder pain like you had at home.  Return to the ED sooner with exertional chest pain, pain associate shortness of breath, nausea, vomiting, sweating or other concerns.

## 2022-12-06 ENCOUNTER — Telehealth: Payer: Self-pay

## 2022-12-06 NOTE — Transitions of Care (Post Inpatient/ED Visit) (Signed)
Unable to reach pt by phone and left v/m for pt to call [phone].        12/06/2022  Name: Yvonne Moore MRN: [id number] DOB: 1960-07-02  Today's TOC FU Call Status: Today's TOC FU Call Status:: Unsuccessful Call (1st Attempt) Unsuccessful Call (1st Attempt) Date: 12/06/22  Attempted to reach the patient regarding the most recent Inpatient/ED visit.  Follow Up Plan: Additional outreach attempts will be made to reach the patient to complete the Transitions of Care (Post Inpatient/ED visit) call.   Signature  Lewanda Rife, LPN

## 2022-12-09 ENCOUNTER — Other Ambulatory Visit: Payer: Self-pay | Admitting: Cardiology

## 2022-12-09 ENCOUNTER — Telehealth: Payer: Self-pay | Admitting: Cardiology

## 2022-12-09 DIAGNOSIS — M79622 Pain in left upper arm: Secondary | ICD-10-CM

## 2022-12-09 DIAGNOSIS — M79602 Pain in left arm: Secondary | ICD-10-CM

## 2022-12-18 NOTE — Telephone Encounter (Signed)
done

## 2023-01-03 ENCOUNTER — Encounter: Payer: Self-pay | Admitting: Gastroenterology

## 2023-01-03 ENCOUNTER — Ambulatory Visit: Payer: BC Managed Care – PPO | Admitting: Certified Registered Nurse Anesthetist

## 2023-01-03 ENCOUNTER — Encounter
Admission: RE | Disposition: A | Discharge status: Home / Self Care | Payer: Self-pay | Source: Home / Self Care | Attending: Gastroenterology

## 2023-01-03 ENCOUNTER — Ambulatory Visit
Admission: RE | Admit: 2023-01-03 | Discharge: 2023-01-03 | Disposition: A | Discharge status: Home / Self Care | Payer: BC Managed Care – PPO | Attending: Gastroenterology | Admitting: Gastroenterology

## 2023-01-03 DIAGNOSIS — Z8616 Personal history of COVID-19: Secondary | ICD-10-CM | POA: Insufficient documentation

## 2023-01-03 DIAGNOSIS — I10 Essential (primary) hypertension: Secondary | ICD-10-CM | POA: Insufficient documentation

## 2023-01-03 DIAGNOSIS — Z1211 Encounter for screening for malignant neoplasm of colon: Secondary | ICD-10-CM

## 2023-01-03 DIAGNOSIS — Z87891 Personal history of nicotine dependence: Secondary | ICD-10-CM | POA: Diagnosis not present

## 2023-01-03 DIAGNOSIS — D120 Benign neoplasm of cecum: Secondary | ICD-10-CM

## 2023-01-03 DIAGNOSIS — F419 Anxiety disorder, unspecified: Secondary | ICD-10-CM | POA: Diagnosis not present

## 2023-01-03 DIAGNOSIS — F32A Depression, unspecified: Secondary | ICD-10-CM | POA: Insufficient documentation

## 2023-01-03 DIAGNOSIS — K219 Gastro-esophageal reflux disease without esophagitis: Secondary | ICD-10-CM | POA: Insufficient documentation

## 2023-01-03 HISTORY — PX: POLYPECTOMY: SHX5525

## 2023-01-03 HISTORY — PX: COLONOSCOPY WITH PROPOFOL: SHX5780

## 2023-01-03 SURGERY — COLONOSCOPY WITH PROPOFOL
Anesthesia: General

## 2023-01-03 MED ORDER — PROPOFOL 10 MG/ML IV BOLUS
INTRAVENOUS | Status: DC | PRN
  Administered 2023-01-03: 60 mg via INTRAVENOUS
  Administered 2023-01-03 (×2): 20 mg via INTRAVENOUS

## 2023-01-03 MED ORDER — SIMETHICONE 40 MG/0.6ML PO SUSP
ORAL | Status: DC | PRN
Start: 2023-01-03 — End: 2023-01-03
  Administered 2023-01-03: 60 mL

## 2023-01-03 MED ORDER — SODIUM CHLORIDE 0.9 % IV SOLN: INTRAVENOUS | Status: DC

## 2023-01-03 MED ORDER — PROPOFOL 500 MG/50ML IV EMUL
INTRAVENOUS | Status: DC | PRN
  Administered 2023-01-03: 160 ug/kg/min via INTRAVENOUS

## 2023-01-03 NOTE — Anesthesia Procedure Notes (Signed)
Date/Time: 01/03/2023 8:59 AM  Performed by: Malva Cogan, CRNAPre-anesthesia Checklist: Patient identified, Emergency Drugs available, Suction available, Patient being monitored and Timeout performed Patient Re-evaluated:Patient Re-evaluated prior to induction Oxygen Delivery Method: Nasal cannula Induction Type: IV induction Placement Confirmation: CO2 detector and positive ETCO2

## 2023-01-03 NOTE — Anesthesia Preprocedure Evaluation (Signed)
Anesthesia Evaluation  Patient identified by MRN, date of birth, ID band Patient awake    Reviewed: Allergy & Precautions, H&P , NPO status , Patient's Chart, lab work & pertinent test results, reviewed documented beta blocker date and time   History of Anesthesia Complications (+) PONV and history of anesthetic complications  Airway Mallampati: I  TM Distance: >3 FB Neck ROM: full    Dental  (+) Dental Advidsory Given, Caps, Teeth Intact   Pulmonary neg pulmonary ROS, Continuous Positive Airway Pressure Ventilation , former smoker   Pulmonary exam normal breath sounds clear to auscultation       Cardiovascular Exercise Tolerance: Good hypertension, (-) angina (-) Past MI and (-) Cardiac Stents Normal cardiovascular exam(-) dysrhythmias (-) Valvular Problems/Murmurs Rhythm:regular Rate:Normal     Neuro/Psych  PSYCHIATRIC DISORDERS Anxiety Depression    negative neurological ROS     GI/Hepatic Neg liver ROS,GERD  ,,  Endo/Other  negative endocrine ROS    Renal/GU negative Renal ROS  negative genitourinary   Musculoskeletal   Abdominal   Peds  Hematology negative hematology ROS (+)   Anesthesia Other Findings Past Medical History: No date: Anxiety No date: Arthritis     Comment:  hips, low back No date: Breast mass, right No date: Chicken pox No date: COVID     Comment:  end 01/2021 No date: Depression No date: GERD (gastroesophageal reflux disease) No date: Plantar fasciitis No date: PONV (postoperative nausea and vomiting) No date: Vertigo   Reproductive/Obstetrics negative OB ROS                             Anesthesia Physical Anesthesia Plan  ASA: 2  Anesthesia Plan: General   Post-op Pain Management:    Induction: Intravenous  PONV Risk Score and Plan: 4 or greater and Propofol infusion and TIVA  Airway Management Planned: Natural Airway and Nasal Cannula  Additional  Equipment:   Intra-op Plan:   Post-operative Plan:   Informed Consent: I have reviewed the patients History and Physical, chart, labs and discussed the procedure including the risks, benefits and alternatives for the proposed anesthesia with the patient or authorized representative who has indicated his/her understanding and acceptance.     Dental Advisory Given  Plan Discussed with: Anesthesiologist, CRNA and Surgeon  Anesthesia Plan Comments:        Anesthesia Quick Evaluation

## 2023-01-03 NOTE — Op Note (Signed)
[facility] Patient Name: Yvonne Moore Procedure Date: 01/03/2023 8:51 AM MRN: [id number] Account #: [id number] Date of Birth: 09-30-1960 Admit Type: Outpatient Age: 62 Room: [facility] ENDO ROOM 2 Gender: Female Note Status: Finalized Instrument Name: Prentice Docker [id number] Procedure:             Colonoscopy Indications:           Screening for colorectal malignant neoplasm, Last                         colonoscopy 10 years ago Providers:             Toney Reil MD, MD Referring MD:          No Local Md, MD (Referring MD) Medicines:             General Anesthesia Complications:         No immediate complications. Estimated blood loss: None. Procedure:             Pre-Anesthesia Assessment:                        - Prior to the procedure, a History and Physical was                         performed, and patient medications and allergies were                         reviewed. The patient is competent. The risks and                         benefits of the procedure and the sedation options and                         risks were discussed with the patient. All questions                         were answered and informed consent was obtained.                         Patient identification and proposed procedure were                         verified by the physician, the nurse, the                         anesthesiologist, the anesthetist and the technician                         in the pre-procedure area in the procedure room in the                         endoscopy suite. Mental Status Examination: alert and                         oriented. Airway Examination: normal oropharyngeal                         airway and neck mobility. Respiratory Examination:  clear to auscultation. CV Examination: normal.                         Prophylactic Antibiotics: The patient does not require                         prophylactic  antibiotics. Prior Anticoagulants: The                         patient has taken no anticoagulant or antiplatelet                         agents. ASA Grade Assessment: II - A patient with mild                         systemic disease. After reviewing the risks and                         benefits, the patient was deemed in satisfactory                         condition to undergo the procedure. The anesthesia                         plan was to use general anesthesia. Immediately prior                         to administration of medications, the patient was                         re-assessed for adequacy to receive sedatives. The                         heart rate, respiratory rate, oxygen saturations,                         blood pressure, adequacy of pulmonary ventilation, and                         response to care were monitored throughout the                         procedure. The physical status of the patient was                         re-assessed after the procedure.                        After obtaining informed consent, the colonoscope was                         passed under direct vision. Throughout the procedure,                         the patient's blood pressure, pulse, and oxygen                         saturations were monitored continuously. The  Colonoscope was introduced through the anus and                         advanced to the the cecum, identified by appendiceal                         orifice and ileocecal valve. The colonoscopy was                         performed without difficulty. The patient tolerated                         the procedure well. The quality of the bowel                         preparation was evaluated using the BBPS [facility] Bowel                         Preparation Scale) with scores of: Right Colon = 3,                         Transverse Colon = 3 and Left Colon = 3 (entire mucosa                         seen  well with no residual staining, small fragments                         of stool or opaque liquid). The total BBPS score                         equals 9. The ileocecal valve, appendiceal orifice,                         and rectum were photographed. Findings:      The perianal and digital rectal examinations were normal. Pertinent       negatives include normal sphincter tone and no palpable rectal lesions.      A 6 mm polyp was found in the cecum. The polyp was sessile. The polyp       was removed with a cold snare. Resection and retrieval were complete.       Estimated blood loss was minimal.      The retroflexed view of the distal rectum and anal verge was normal and       showed no anal or rectal abnormalities.      The exam was otherwise without abnormality on direct and retroflexion       views. Impression:            - One 6 mm polyp in the cecum, removed with a cold                         snare. Resected and retrieved.                        - The distal rectum and anal verge are normal on                         retroflexion view.                        -  The examination was otherwise normal on direct and                         retroflexion views. Recommendation:        - Discharge patient to home (with escort).                        - Resume previous diet today.                        - Continue present medications.                        - Await pathology results.                        - Repeat colonoscopy in 5 to 7 years for surveillance                         based on pathology results. Procedure Code(s):     --- Professional ---                        [phone], Colonoscopy, flexible; with removal of                         tumor(s), polyp(s), or other lesion(s) by snare                         technique Diagnosis Code(s):     --- Professional ---                        Z12.11, Encounter for screening for malignant neoplasm                         of colon                         D12.0, Benign neoplasm of cecum CPT copyright 2022 American Medical Association. All rights reserved. The codes documented in this report are preliminary and upon coder review may  be revised to meet current compliance requirements. Dr. Libby Maw Toney Reil MD, MD 01/03/2023 9:21:00 AM This report has been signed electronically. Number of Addenda: 0 Note Initiated On: 01/03/2023 8:51 AM Scope Withdrawal Time: 0 hours 8 minutes 41 seconds  Total Procedure Duration: 0 hours 14 minutes 12 seconds  Estimated Blood Loss:  Estimated blood loss: none.      [facility]

## 2023-01-03 NOTE — H&P (Signed)
Arlyss Repress, MD [address]  [address]  [place], [place] [address]  Main: [phone]  Fax: [phone] Pager: [phone]  Primary Care Physician:  Bethanie Dicker, NP Primary Gastroenterologist:  Dr. Arlyss Repress  Pre-Procedure History & Physical: HPI:  Yvonne Moore is a 62 y.o. female is here for an colonoscopy.   Past Medical History:  Diagnosis Date   Anxiety    Arthritis    hips, low back   Breast mass, right    Chicken pox    COVID    end 01/2021   Depression    GERD (gastroesophageal reflux disease)    Plantar fasciitis    PONV (postoperative nausea and vomiting)    Vertigo     Past Surgical History:  Procedure Laterality Date   BREAST BIOPSY Right 10/17/2015   stereo/atypical ductal hyperplasia   BREAST BIOPSY Left 11/24/2017   x marker, PSEUDO-ANGIOMATOUS STROMAL HYPERPLASIA   BREAST EXCISIONAL BIOPSY Right 2017   atypical ductal hyperplasia excisied   BREAST SURGERY Bilateral 2010   Reduction    CARPAL TUNNEL RELEASE     Unsure of date and which laterality    CERVICAL SPINE SURGERY  2012   Discectomy and fusion of C4,5, and 6 Dr. Gretta Began    HIP ARTHROPLASTY Bilateral    Dr. Odis Luster [facility] b/l hips since 07/2021   LASIK Bilateral 2001   RADIOACTIVE SEED GUIDED EXCISIONAL BREAST BIOPSY Right 12/04/2015   Procedure: RADIOACTIVE SEED GUIDED EXCISIONAL BREAST BIOPSY;  Surgeon: Emelia Loron, MD;  Location: [facility];  Service: General;  Laterality: Right;   REDUCTION MAMMAPLASTY Bilateral 2010   REVISION TOTAL HIP ARTHROPLASTY Bilateral 2023   SHOULDER SURGERY Left 1982   bone spur removed/ bone separation repair   thumb joint replacement     L thumb Dr. Amanda Pea    TONSILLECTOMY AND ADENOIDECTOMY  1967    Prior to Admission medications   Medication Sig Start Date End Date Taking? Authorizing Provider  amLODipine (NORVASC) 2.5 MG tablet TAKE ONE TABLET BY MOUTH EVERY DAY 11/06/22  Yes Bethanie Dicker, NP  Ascorbic Acid  (VITAMIN C) 1000 MG tablet Take 1,000 mg by mouth daily.   Yes [provider]  Biotin 96295 MCG TABS Take 10,000 mcg by mouth daily. 10/04/19  Yes [provider]  Black Pepper-Turmeric (TURMERIC CURCUMIN) 09-998 MG CAPS Take 1 capsule by mouth daily.   Yes [provider]  Cholecalciferol (VITAMIN D) 125 MCG (5000 UT) CAPS  02/03/19  Yes [provider]  citalopram (CELEXA) 20 MG tablet Take 1 tablet (20 mg total) by mouth daily. 02/13/22  Yes McLean-Scocuzza, Pasty Spillers, MD  meloxicam (MOBIC) 15 MG tablet Take 15 mg by mouth daily as needed for pain. 11/20/21  Yes [provider]  Multiple Vitamins-Minerals (WOMENS MULTIVITAMIN PLUS PO) Take by mouth once.   Yes [provider]  Omega-3 Fatty Acids (FISH OIL) 1000 MG CAPS Take 1,000 mg by mouth daily.   Yes [provider]  omeprazole (PRILOSEC) 40 MG capsule Take 1 capsule (40 mg total) by mouth daily. 05/10/22  Yes Bethanie Dicker, NP  phentermine (ADIPEX-P) 37.5 MG tablet Take 37.5 mg by mouth daily before breakfast. 11/25/22  Yes [provider]  pyridOXINE (VITAMIN B-6) 100 MG tablet Take 100 mg by mouth daily.    Yes [provider]  ALPRAZolam (XANAX) 0.25 MG tablet Take 1 tablet (0.25 mg total) by mouth daily as needed for anxiety. 02/13/22   McLean-Scocuzza,  Pasty Spillers, MD  aspirin 81 MG chewable tablet Chew 1 tablet (81 mg total) by mouth daily. Patient not taking: Reported on 01/03/2023 12/05/22   Glynn Octave, MD  nitroGLYCERIN (NITROSTAT) 0.4 MG SL tablet Place 1 tablet (0.4 mg total) under the tongue every 5 (five) minutes as needed for chest pain. Patient not taking: Reported on 01/03/2023 12/05/22   Glynn Octave, MD  TOPAMAX 25 MG tablet Take 25 mg by mouth daily. Patient not taking: Reported on 01/03/2023 10/16/22   [provider]  ZEPBOUND 5 MG/0.5ML Pen Inject 5 mg into the skin once a week. Monday 11/06/22   [provider]    Allergies as  of 11/13/2022 - Review Complete 11/13/2022  Allergen Reaction Noted   Effexor xr [venlafaxine hcl]  03/22/2021   Oxycodone Other (See Comments) 10/11/2021    Family History  Problem Relation Age of Onset   Arthritis Mother    Hypertension Mother    Kidney disease Mother    Alcohol abuse Father    Arthritis Father    Lung cancer Father 55   Hypertension Father    Alcohol abuse Brother    Hypertension Brother    Colon polyps Brother    Lung cancer Maternal Grandmother    Hypertension Maternal Grandmother    Arthritis Maternal Aunt    Arthritis Maternal Uncle    Lung cancer Maternal Uncle    Bladder Cancer Maternal Uncle    Colon cancer Paternal Uncle        x5 "Lynch+"   Leukemia Paternal Uncle    Lung cancer Paternal Uncle    Rectal cancer Cousin        vs anal   Breast cancer Cousin        dx 42s   Colon cancer Cousin        dx 46s; "Lynch +"   Melanoma Cousin    Bladder Cancer Cousin    Breast cancer Cousin        dx 55s   Other Other        lynch    Social History   Socioeconomic History   Marital status: Married    Spouse name: Not on file   Number of children: Not on file   Years of education: Not on file   Highest education level: Not on file  Occupational History   Not on file  Tobacco Use   Smoking status: Former    Types: Cigarettes   Smokeless tobacco: Never   Tobacco comments:    socially in college, 1 pk lasted 1 month  Vaping Use   Vaping status: Never Used  Substance and Sexual Activity   Alcohol use: Yes    Alcohol/week: 0.0 - 1.0 standard drinks of alcohol    Comment: social   Drug use: No   Sexual activity: Not Currently    Birth control/protection: Post-menopausal  Other Topics Concern   Not on file  Social History Narrative   Married   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, systems    1 child son 56 y/o 11/2017, 1 granddaughter 4 months as of 03/05/18    Social Determinants of Health   Financial Resource Strain: Not on file  Food  Insecurity: Not on file  Transportation Needs: Not on file  Physical Activity: Not on file  Stress: Not on file  Social Connections: Not on file  Intimate Partner Violence: Not on file    Review of Systems: See HPI, otherwise negative ROS  Physical  Exam: BP (!) 146/83   Pulse 64   Temp (!) 97.2 F (36.2 C) (Temporal)   Resp 18   Ht 5\' 3"  (1.6 m)   Wt 60.1 kg   LMP 06/08/2016 (Exact Date) Comment: spotted in May for 2 days  SpO2 100%   BMI 23.47 kg/m  General:   Alert,  pleasant and cooperative in NAD Head:  Normocephalic and atraumatic. Neck:  Supple; no masses or thyromegaly. Lungs:  Clear throughout to auscultation.    Heart:  Regular rate and rhythm. Abdomen:  Soft, nontender and nondistended. Normal bowel sounds, without guarding, and without rebound.   Neurologic:  Alert and  oriented x4;  grossly normal neurologically.  Impression/Plan: Yvonne Moore is here for an colonoscopy to be performed for colon cancer screening  Risks, benefits, limitations, and alternatives regarding  colonoscopy have been reviewed with the patient.  Questions have been answered.  All parties agreeable.   Lannette Donath, MD  01/03/2023, 8:44 AM

## 2023-01-03 NOTE — Transfer of Care (Signed)
Immediate Anesthesia Transfer of Care Note  Patient: Yvonne Moore  Procedure(s) Performed: COLONOSCOPY WITH PROPOFOL POLYPECTOMY  Patient Location: PACU  Anesthesia Type:General  Level of Consciousness: awake and alert   Airway & Oxygen Therapy: Patient Spontanous Breathing  Post-op Assessment: Report given, VSS  Post vital signs: Reviewed and stable  Last Vitals:  Vitals Value Taken Time  BP 111/68 01/03/23 0922  Temp 35.6 C 01/03/23 0922  Pulse 66 01/03/23 0922  Resp 13 01/03/23 0922  SpO2 100 % 01/03/23 0922    Last Pain:  Vitals:   01/03/23 0922  TempSrc: Temporal  PainSc: Asleep         Complications: No notable events documented.

## 2023-01-07 ENCOUNTER — Encounter: Payer: Self-pay | Admitting: Gastroenterology

## 2023-01-09 NOTE — Anesthesia Postprocedure Evaluation (Signed)
Anesthesia Post Note  Patient: Yvonne Moore  Procedure(s) Performed: COLONOSCOPY WITH PROPOFOL POLYPECTOMY  Patient location during evaluation: Endoscopy Anesthesia Type: General Level of consciousness: awake and alert Pain management: pain level controlled Vital Signs Assessment: post-procedure vital signs reviewed and stable Respiratory status: spontaneous breathing, nonlabored ventilation, respiratory function stable and patient connected to nasal cannula oxygen Cardiovascular status: blood pressure returned to baseline and stable Postop Assessment: no apparent nausea or vomiting Anesthetic complications: no   No notable events documented.   Last Vitals:  Vitals:   01/03/23 0922 01/03/23 0932  BP: 111/68 132/83  Pulse: 66 63  Resp: 13 13  Temp: (!) 35.6 C   SpO2: 100% 100%    Last Pain:  Vitals:   01/04/23 1022  TempSrc:   PainSc: 0-No pain                 Lenard Simmer

## 2023-02-17 ENCOUNTER — Ambulatory Visit: Payer: BC Managed Care – PPO | Admitting: Obstetrics and Gynecology

## 2023-02-26 ENCOUNTER — Encounter: Payer: Self-pay | Admitting: Cardiology

## 2023-03-05 ENCOUNTER — Ambulatory Visit: Payer: BC Managed Care – PPO | Admitting: Obstetrics and Gynecology

## 2023-03-05 ENCOUNTER — Ambulatory Visit (INDEPENDENT_AMBULATORY_CARE_PROVIDER_SITE_OTHER): Payer: BC Managed Care – PPO | Admitting: Obstetrics and Gynecology

## 2023-03-05 ENCOUNTER — Encounter: Payer: Self-pay | Admitting: Obstetrics and Gynecology

## 2023-03-05 ENCOUNTER — Other Ambulatory Visit (HOSPITAL_COMMUNITY)
Admission: RE | Admit: 2023-03-05 | Discharge: 2023-03-05 | Disposition: A | Discharge status: Home / Self Care | Payer: BC Managed Care – PPO | Source: Ambulatory Visit | Attending: Obstetrics and Gynecology | Admitting: Obstetrics and Gynecology

## 2023-03-05 VITALS — BP 122/70 | HR 76 | Ht 63.0 in | Wt 126.0 lb

## 2023-03-05 DIAGNOSIS — Z124 Encounter for screening for malignant neoplasm of cervix: Secondary | ICD-10-CM | POA: Insufficient documentation

## 2023-03-05 DIAGNOSIS — R87810 Cervical high risk human papillomavirus (HPV) DNA test positive: Secondary | ICD-10-CM | POA: Diagnosis not present

## 2023-03-05 DIAGNOSIS — Z01419 Encounter for gynecological examination (general) (routine) without abnormal findings: Secondary | ICD-10-CM | POA: Diagnosis not present

## 2023-03-05 NOTE — Progress Notes (Signed)
62 y.o. G1P1001 postmenopausal female with hx of right breast lumpectomy (atypical hyperplasia, 2017, followed by Gen Surg, annual contract enhanced MMG), former smoker here for annual exam. Retired Runner, broadcasting/film/video.  Postmenopausal bleeding: none Pelvic discharge or pain: none Breast mass, nipple discharge or skin changes : none Last PAP:     Component Value Date/Time   DIAGPAP  02/07/2020 1419    - Negative for intraepithelial lesion or malignancy (NILM)   DIAGPAP  12/18/2017 0000    NEGATIVE FOR INTRAEPITHELIAL LESIONS OR MALIGNANCY.   HPVHIGH Negative 02/07/2020 1419   ADEQPAP  02/07/2020 1419    Satisfactory for evaluation; transformation zone component PRESENT.   ADEQPAP  12/18/2017 0000    Satisfactory for evaluation  endocervical/transformation zone component PRESENT.   Last mammogram: 11/14/22 contrast enhanced MMG, BIRADS 1, density c  Last colonoscopy: 01/03/23 +polyp, q5y Last DXA: none Sexually active: no  Exercising: walking daily  GYN HISTORY: Pap 10/28/16 negative with +HPV 10/2015 breast lumpectomy with atypical hyperplasia  OB History  Gravida Para Term Preterm AB Living  1 1 1     1   SAB IAB Ectopic Multiple Live Births          1    # Outcome Date GA Lbr Len/2nd Weight Sex Type Anes PTL Lv  1 Term 03/29/88    Judie Petit Vag-Spont   LIV    Past Medical History:  Diagnosis Date   Anxiety    Arthritis    hips, low back   Breast mass, right    Chicken pox    COVID    end 01/2021   Depression    GERD (gastroesophageal reflux disease)    Plantar fasciitis    PONV (postoperative nausea and vomiting)    Vertigo     Past Surgical History:  Procedure Laterality Date   BREAST BIOPSY Right 10/17/2015   stereo/atypical ductal hyperplasia   BREAST BIOPSY Left 11/24/2017   x marker, PSEUDO-ANGIOMATOUS STROMAL HYPERPLASIA   BREAST EXCISIONAL BIOPSY Right 2017   atypical ductal hyperplasia excisied   BREAST SURGERY Bilateral 2010   Reduction    CARPAL TUNNEL RELEASE      Unsure of date and which laterality    CERVICAL SPINE SURGERY  2012   Discectomy and fusion of C4,5, and 6 Dr. Gretta Began    COLONOSCOPY WITH PROPOFOL N/A 01/03/2023   Procedure: COLONOSCOPY WITH PROPOFOL;  Surgeon: Toney Reil, MD;  Location: [facility];  Service: Gastroenterology;  Laterality: N/A;   HIP ARTHROPLASTY Bilateral    Dr. Odis Luster [facility] b/l hips since 07/2021   LASIK Bilateral 2001   POLYPECTOMY  01/03/2023   Procedure: POLYPECTOMY;  Surgeon: Toney Reil, MD;  Location: [facility];  Service: Gastroenterology;;   RADIOACTIVE SEED GUIDED EXCISIONAL BREAST BIOPSY Right 12/04/2015   Procedure: RADIOACTIVE SEED GUIDED EXCISIONAL BREAST BIOPSY;  Surgeon: Emelia Loron, MD;  Location: [facility];  Service: General;  Laterality: Right;   REDUCTION MAMMAPLASTY Bilateral 2010   REVISION TOTAL HIP ARTHROPLASTY Bilateral 2023   SHOULDER SURGERY Left 1982   bone spur removed/ bone separation repair   thumb joint replacement     L thumb Dr. Amanda Pea    TONSILLECTOMY AND ADENOIDECTOMY  1967    Current Outpatient Medications on File Prior to Visit  Medication Sig Dispense Refill   ALPRAZolam (XANAX) 0.25 MG tablet Take 1 tablet (0.25 mg total) by mouth daily as needed for anxiety. 30 tablet 5   amLODipine (NORVASC) 2.5 MG tablet TAKE  ONE TABLET BY MOUTH EVERY DAY 90 tablet 3   Apoaequorin (PREVAGEN PO) Take by mouth.     Ascorbic Acid (VITAMIN C) 1000 MG tablet Take 1,000 mg by mouth daily.     Biotin 40981 MCG TABS Take 10,000 mcg by mouth daily.     Black Pepper-Turmeric (TURMERIC CURCUMIN) 09-998 MG CAPS Take 1 capsule by mouth daily.     Cholecalciferol (VITAMIN D) 125 MCG (5000 UT) CAPS      citalopram (CELEXA) 20 MG tablet Take 1 tablet (20 mg total) by mouth daily. 90 tablet 3   meloxicam (MOBIC) 15 MG tablet Take 15 mg by mouth daily as needed for pain.     Multiple Vitamins-Minerals (WOMENS MULTIVITAMIN PLUS PO) Take by mouth once.      Omega-3 Fatty Acids (FISH OIL) 1000 MG CAPS Take 1,000 mg by mouth daily.     omeprazole (PRILOSEC) 40 MG capsule Take 1 capsule (40 mg total) by mouth daily. 90 capsule 3   phentermine (ADIPEX-P) 37.5 MG tablet Take 37.5 mg by mouth daily before breakfast.     pyridOXINE (VITAMIN B-6) 100 MG tablet Take 100 mg by mouth daily.      ZEPBOUND 5 MG/0.5ML Pen Inject 5 mg into the skin once a week. Monday     No current facility-administered medications on file prior to visit.    Social History   Socioeconomic History   Marital status: Married    Spouse name: Not on file   Number of children: Not on file   Years of education: Not on file   Highest education level: Not on file  Occupational History   Not on file  Tobacco Use   Smoking status: Former    Types: Cigarettes   Smokeless tobacco: Never   Tobacco comments:    socially in college, 1 pk lasted 1 month  Vaping Use   Vaping status: Never Used  Substance and Sexual Activity   Alcohol use: Yes    Alcohol/week: 0.0 - 1.0 standard drinks of alcohol    Comment: social   Drug use: No   Sexual activity: Not Currently    Birth control/protection: Post-menopausal  Other Topics Concern   Not on file  Social History Narrative   Married   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, systems    1 child son 61 y/o 11/2017, 1 granddaughter 4 months as of 03/05/18    Social Determinants of Corporate investment banker Strain: Not on file  Food Insecurity: Not on file  Transportation Needs: Not on file  Physical Activity: Not on file  Stress: Not on file  Social Connections: Not on file  Intimate Partner Violence: Not on file    Family History  Problem Relation Age of Onset   Arthritis Mother    Hypertension Mother    Kidney disease Mother    Alcohol abuse Father    Arthritis Father    Lung cancer Father 4   Hypertension Father    Alcohol abuse Brother    Hypertension Brother    Colon polyps Brother    Lung cancer Maternal Grandmother     Hypertension Maternal Grandmother    Arthritis Maternal Aunt    Arthritis Maternal Uncle    Lung cancer Maternal Uncle    Bladder Cancer Maternal Uncle    Colon cancer Paternal Uncle        x5 "Lynch+"   Leukemia Paternal Uncle    Lung cancer Paternal Uncle    Rectal  cancer Cousin        vs anal   Breast cancer Cousin        dx 27s   Colon cancer Cousin        dx 52s; "Lynch +"   Melanoma Cousin    Bladder Cancer Cousin    Breast cancer Cousin        dx 6s   Other Other        lynch    Allergies  Allergen Reactions   Effexor Xr [Venlafaxine Hcl]     Worsening anxiety, thoughts of wanting to leave this earth tapered off end 12/2020/01/2021   Oxycodone Rash    Didn't develop immediately, occurred after taking for a week or so.      PE Today's Vitals   03/05/23 1135  BP: 122/70  Pulse: 76  SpO2: 98%  Weight: 126 lb (57.2 kg)  Height: 5\' 3"  (1.6 m)   Body mass index is 22.32 kg/m.  Physical Exam Vitals reviewed. Exam conducted with a chaperone present.  Constitutional:      General: She is not in acute distress.    Appearance: Normal appearance.  HENT:     Head: Normocephalic and atraumatic.     Nose: Nose normal.  Eyes:     Extraocular Movements: Extraocular movements intact.     Conjunctiva/sclera: Conjunctivae normal.  Neck:     Thyroid: No thyroid mass, thyromegaly or thyroid tenderness.  Pulmonary:     Effort: Pulmonary effort is normal.  Chest:     Chest wall: No mass or tenderness.  Breasts:    Right: Normal. No swelling, mass, nipple discharge, skin change or tenderness.     Left: Normal. No swelling, mass, nipple discharge, skin change or tenderness.     Comments: Bilateral breast reduction scars Abdominal:     General: There is no distension.     Palpations: Abdomen is soft.     Tenderness: There is no abdominal tenderness.  Genitourinary:    General: Normal vulva.     Exam position: Lithotomy position.     Urethra: No prolapse.      Vagina: Normal. No vaginal discharge or bleeding.     Cervix: Normal. No lesion.     Uterus: Normal. Not enlarged and not tender.      Adnexa: Right adnexa normal and left adnexa normal.  Musculoskeletal:        General: Normal range of motion.     Cervical back: Normal range of motion.  Lymphadenopathy:     Upper Body:     Right upper body: No axillary adenopathy.     Left upper body: No axillary adenopathy.     Lower Body: No right inguinal adenopathy. No left inguinal adenopathy.  Skin:    General: Skin is warm and dry.  Neurological:     General: No focal deficit present.     Mental Status: She is alert.  Psychiatric:        Mood and Affect: Mood normal.        Behavior: Behavior normal.      Assessment and Plan:        Well woman exam with routine gynecological exam Assessment & Plan: Cervical cancer screening performed according to ASCCP guidelines. Encouraged annual mammogram screening Colonoscopy UTD DXA not indicated Labs and immunizations with her primary Encouraged safe sexual practices as indicated Encouraged healthy lifestyle practices with diet and exercise For patients under 50-70yo, I recommend 1200mg  calcium daily and 600IU of  vitamin D daily.    Cervical cancer screening -     Cytology - PAP  Cervical high risk human papillomavirus (HPV) DNA test positive -     Cytology - PAP    Yvonne Lant Lasandra Beech, MD

## 2023-03-05 NOTE — Patient Instructions (Signed)
For patients under 50-61yo, I recommend 1200mg  calcium daily and 600IU of vitamin D daily. For patients over 61yo, I recommend 1200mg  calcium daily and 800IU of vitamin D daily.  Health Maintenance, Female Adopting a healthy lifestyle and getting preventive care are important in promoting health and wellness. Ask your health care provider about: The right schedule for you to have regular tests and exams. Things you can do on your own to prevent diseases and keep yourself healthy. What should I know about diet, weight, and exercise? Eat a healthy diet  Eat a diet that includes plenty of vegetables, fruits, low-fat dairy products, and lean protein. Do not eat a lot of foods that are high in solid fats, added sugars, or sodium. Maintain a healthy weight Body mass index (BMI) is used to identify weight problems. It estimates body fat based on height and weight. Your health care provider can help determine your BMI and help you achieve or maintain a healthy weight. Get regular exercise Get regular exercise. This is one of the most important things you can do for your health. Most adults should: Exercise for at least 150 minutes each week. The exercise should increase your heart rate and make you sweat (moderate-intensity exercise). Do strengthening exercises at least twice a week. This is in addition to the moderate-intensity exercise. Spend less time sitting. Even light physical activity can be beneficial. Watch cholesterol and blood lipids Have your blood tested for lipids and cholesterol at 61 years of age, then have this test every 5 years. Have your cholesterol levels checked more often if: Your lipid or cholesterol levels are high. You are older than 61 years of age. You are at high risk for heart disease. What should I know about cancer screening? Depending on your health history and family history, you may need to have cancer screening at various ages. This may include screening  for: Breast cancer. Cervical cancer. Colorectal cancer. Skin cancer. Lung cancer. What should I know about heart disease, diabetes, and high blood pressure? Blood pressure and heart disease High blood pressure causes heart disease and increases the risk of stroke. This is more likely to develop in people who have high blood pressure readings or are overweight. Have your blood pressure checked: Every 3-5 years if you are 83-61 years of age. Every year if you are 4 years old or older. Diabetes Have regular diabetes screenings. This checks your fasting blood sugar level. Have the screening done: Once every three years after age 68 if you are at a normal weight and have a low risk for diabetes. More often and at a younger age if you are overweight or have a high risk for diabetes. What should I know about preventing infection? Hepatitis B If you have a higher risk for hepatitis B, you should be screened for this virus. Talk with your health care provider to find out if you are at risk for hepatitis B infection. Hepatitis C Testing is recommended for: Everyone born from [birth year] through 1964. Anyone with known risk factors for hepatitis C. Sexually transmitted infections (STIs) Get screened for STIs, including gonorrhea and chlamydia, if: You are sexually active and are younger than 61 years of age. You are older than 61 years of age and your health care provider tells you that you are at risk for this type of infection. Your sexual activity has changed since you were last screened, and you are at increased risk for chlamydia or gonorrhea. Ask your health care provider if  you are at risk. Ask your health care provider about whether you are at high risk for HIV. Your health care provider may recommend a prescription medicine to help prevent HIV infection. If you choose to take medicine to prevent HIV, you should first get tested for HIV. You should then be tested every 3 months for as long as you  are taking the medicine. Osteoporosis and menopause Osteoporosis is a disease in which the bones lose minerals and strength with aging. This can result in bone fractures. If you are 44 years old or older, or if you are at risk for osteoporosis and fractures, ask your health care provider if you should: Be screened for bone loss. Take a calcium or vitamin D supplement to lower your risk of fractures. Be given hormone replacement therapy (HRT) to treat symptoms of menopause. Follow these instructions at home: Alcohol use Do not drink alcohol if: Your health care provider tells you not to drink. You are pregnant, may be pregnant, or are planning to become pregnant. If you drink alcohol: Limit how much you have to: 0-1 drink a day. Know how much alcohol is in your drink. In the [place] one drink equals one 12 oz bottle of beer (355 mL), one 5 oz glass of wine (148 mL), or one 1 oz glass of hard liquor (44 mL). Lifestyle Do not use any products that contain nicotine or tobacco. These products include cigarettes, chewing tobacco, and vaping devices, such as e-cigarettes. If you need help quitting, ask your health care provider. Do not use street drugs. Do not share needles. Ask your health care provider for help if you need support or information about quitting drugs. General instructions Schedule regular health, dental, and eye exams. Stay current with your vaccines. Tell your health care provider if: You often feel depressed. You have ever been abused or do not feel safe at home. Summary Adopting a healthy lifestyle and getting preventive care are important in promoting health and wellness. Follow your health care provider's instructions about healthy diet, exercising, and getting tested or screened for diseases. Follow your health care provider's instructions on monitoring your cholesterol and blood pressure. This information is not intended to replace advice given to you by your health  care provider. Make sure you discuss any questions you have with your health care provider. Document Revised: 09/11/2020 Document Reviewed: 09/11/2020 Elsevier Patient Education  2024 [employer].

## 2023-03-05 NOTE — Assessment & Plan Note (Signed)
Cervical cancer screening performed according to ASCCP guidelines. Encouraged annual mammogram screening Colonoscopy UTD DXA not indicated Labs and immunizations with her primary Encouraged safe sexual practices as indicated Encouraged healthy lifestyle practices with diet and exercise For patients under 50-61yo, I recommend 1200mg  calcium daily and 600IU of vitamin D daily.

## 2023-03-10 LAB — CYTOLOGY - PAP
Comment: NEGATIVE
Diagnosis: NEGATIVE
High risk HPV: NEGATIVE

## 2023-03-13 ENCOUNTER — Encounter: Payer: Self-pay | Admitting: Nurse Practitioner

## 2023-03-18 ENCOUNTER — Telehealth: Payer: Self-pay

## 2023-03-18 ENCOUNTER — Other Ambulatory Visit: Payer: Self-pay | Admitting: Nurse Practitioner

## 2023-03-18 DIAGNOSIS — F32A Depression, unspecified: Secondary | ICD-10-CM

## 2023-03-18 MED ORDER — ALPRAZOLAM 0.25 MG PO TABS
0.2500 mg | ORAL_TABLET | Freq: Every day | ORAL | 5 refills | Status: DC | PRN
Start: 2023-03-18 — End: 2023-11-19

## 2023-03-18 NOTE — Telephone Encounter (Signed)
Non opioid controlled substance agreement has been created for pts alprazolam

## 2023-03-24 ENCOUNTER — Other Ambulatory Visit: Payer: Self-pay | Admitting: Nurse Practitioner

## 2023-03-24 DIAGNOSIS — M62838 Other muscle spasm: Secondary | ICD-10-CM

## 2023-03-24 MED ORDER — CYCLOBENZAPRINE HCL 10 MG PO TABS: 10.0000 mg | ORAL_TABLET | Freq: Every evening | ORAL | 5 refills | Status: DC | PRN

## 2023-04-28 ENCOUNTER — Encounter: Payer: Self-pay | Admitting: Nurse Practitioner

## 2023-04-28 DIAGNOSIS — F419 Anxiety disorder, unspecified: Secondary | ICD-10-CM

## 2023-04-28 DIAGNOSIS — R454 Irritability and anger: Secondary | ICD-10-CM

## 2023-04-28 MED ORDER — CITALOPRAM HYDROBROMIDE 20 MG PO TABS
20.0000 mg | ORAL_TABLET | Freq: Every day | ORAL | 0 refills | Status: DC
Start: 2023-04-28 — End: 2023-04-29

## 2023-04-28 NOTE — Telephone Encounter (Signed)
Pt requesting filled by previous provider

## 2023-04-29 MED ORDER — CITALOPRAM HYDROBROMIDE 20 MG PO TABS: 20.0000 mg | ORAL_TABLET | Freq: Every day | ORAL | 3 refills | Status: DC

## 2023-05-10 ENCOUNTER — Other Ambulatory Visit: Payer: Self-pay | Admitting: Nurse Practitioner

## 2023-05-10 DIAGNOSIS — K219 Gastro-esophageal reflux disease without esophagitis: Secondary | ICD-10-CM

## 2023-05-14 ENCOUNTER — Other Ambulatory Visit: Payer: Self-pay | Admitting: Family

## 2023-05-14 DIAGNOSIS — R454 Irritability and anger: Secondary | ICD-10-CM

## 2023-05-14 DIAGNOSIS — F419 Anxiety disorder, unspecified: Secondary | ICD-10-CM

## 2023-05-14 MED ORDER — CITALOPRAM HYDROBROMIDE 20 MG PO TABS
20.0000 mg | ORAL_TABLET | Freq: Every day | ORAL | 0 refills | Status: DC
Start: 2023-05-14 — End: 2023-11-27

## 2023-05-29 NOTE — Telephone Encounter (Signed)
Noted  

## 2023-11-03 ENCOUNTER — Telehealth: Payer: Self-pay | Admitting: Nurse Practitioner

## 2023-11-03 NOTE — Telephone Encounter (Signed)
 Patient need labs ordered please

## 2023-11-11 ENCOUNTER — Other Ambulatory Visit: Payer: Self-pay | Admitting: Nurse Practitioner

## 2023-11-11 DIAGNOSIS — I10 Essential (primary) hypertension: Secondary | ICD-10-CM

## 2023-11-11 DIAGNOSIS — Z13220 Encounter for screening for lipoid disorders: Secondary | ICD-10-CM

## 2023-11-11 DIAGNOSIS — Z1329 Encounter for screening for other suspected endocrine disorder: Secondary | ICD-10-CM

## 2023-11-11 DIAGNOSIS — F32A Depression, unspecified: Secondary | ICD-10-CM

## 2023-11-14 ENCOUNTER — Other Ambulatory Visit: Payer: Self-pay | Admitting: General Surgery

## 2023-11-14 DIAGNOSIS — Z1231 Encounter for screening mammogram for malignant neoplasm of breast: Secondary | ICD-10-CM

## 2023-11-17 ENCOUNTER — Other Ambulatory Visit (INDEPENDENT_AMBULATORY_CARE_PROVIDER_SITE_OTHER): Payer: BC Managed Care – PPO

## 2023-11-17 DIAGNOSIS — Z13220 Encounter for screening for lipoid disorders: Secondary | ICD-10-CM | POA: Diagnosis not present

## 2023-11-17 DIAGNOSIS — Z1329 Encounter for screening for other suspected endocrine disorder: Secondary | ICD-10-CM

## 2023-11-17 DIAGNOSIS — I10 Essential (primary) hypertension: Secondary | ICD-10-CM | POA: Diagnosis not present

## 2023-11-17 DIAGNOSIS — F419 Anxiety disorder, unspecified: Secondary | ICD-10-CM

## 2023-11-17 DIAGNOSIS — F32A Depression, unspecified: Secondary | ICD-10-CM | POA: Diagnosis not present

## 2023-11-17 LAB — COMPREHENSIVE METABOLIC PANEL WITH GFR
ALT: 30 U/L (ref 0–35)
AST: 31 U/L (ref 0–37)
Albumin: 4.5 g/dL (ref 3.5–5.2)
Alkaline Phosphatase: 45 U/L (ref 39–117)
BUN: 11 mg/dL (ref 6–23)
CO2: 32 meq/L (ref 19–32)
Calcium: 9.4 mg/dL (ref 8.4–10.5)
Chloride: 100 meq/L (ref 96–112)
Creatinine, Ser: 0.72 mg/dL (ref 0.40–1.20)
GFR: 90.11 mL/min (ref >60.00)
Glucose, Bld: 78 mg/dL (ref 70–99)
Potassium: 4 meq/L (ref 3.5–5.1)
Sodium: 138 meq/L (ref 135–145)
Total Bilirubin: 0.5 mg/dL (ref 0.2–1.2)
Total Protein: 7 g/dL (ref 6.0–8.3)

## 2023-11-17 LAB — CBC WITH DIFFERENTIAL/PLATELET
Basophils Absolute: 0.1 K/uL (ref 0.0–0.1)
Basophils Relative: 1.7 % (ref 0.0–3.0)
Eosinophils Absolute: 0.1 K/uL (ref 0.0–0.7)
Eosinophils Relative: 2.4 % (ref 0.0–5.0)
HCT: 37.3 % (ref 36.0–46.0)
Hemoglobin: 12.5 g/dL (ref 12.0–15.0)
Lymphocytes Relative: 48.2 % — ABNORMAL HIGH (ref 12.0–46.0)
Lymphs Abs: 1.8 K/uL (ref 0.7–4.0)
MCHC: 33.7 g/dL (ref 30.0–36.0)
MCV: 90.3 fl (ref 78.0–100.0)
Monocytes Absolute: 0.4 K/uL (ref 0.1–1.0)
Monocytes Relative: 9.7 % (ref 3.0–12.0)
Neutro Abs: 1.4 K/uL (ref 1.4–7.7)
Neutrophils Relative %: 38 % — ABNORMAL LOW (ref 43.0–77.0)
Platelets: 232 K/uL (ref 150.0–400.0)
RBC: 4.13 Mil/uL (ref 3.87–5.11)
RDW: 13.2 % (ref 11.5–15.5)
WBC: 3.7 K/uL — ABNORMAL LOW (ref 4.0–10.5)

## 2023-11-17 LAB — LIPID PANEL
Cholesterol: 202 mg/dL — ABNORMAL HIGH (ref 0–200)
HDL: 98.8 mg/dL (ref >39.00)
LDL Cholesterol: 94 mg/dL (ref 0–99)
NonHDL: 103.56
Total CHOL/HDL Ratio: 2
Triglycerides: 49 mg/dL (ref 0.0–149.0)
VLDL: 9.8 mg/dL (ref 0.0–40.0)

## 2023-11-17 LAB — TSH: TSH: 0.91 u[IU]/mL (ref 0.35–5.50)

## 2023-11-17 LAB — VITAMIN D 25 HYDROXY (VIT D DEFICIENCY, FRACTURES): VITD: 90.1 ng/mL (ref 30.00–100.00)

## 2023-11-19 ENCOUNTER — Ambulatory Visit (INDEPENDENT_AMBULATORY_CARE_PROVIDER_SITE_OTHER): Payer: BC Managed Care – PPO | Admitting: Nurse Practitioner

## 2023-11-19 VITALS — BP 122/78 | HR 79 | Temp 98.4°F | Ht 62.6 in | Wt 120.8 lb

## 2023-11-19 DIAGNOSIS — M2550 Pain in unspecified joint: Secondary | ICD-10-CM

## 2023-11-19 DIAGNOSIS — F32A Depression, unspecified: Secondary | ICD-10-CM

## 2023-11-19 DIAGNOSIS — F419 Anxiety disorder, unspecified: Secondary | ICD-10-CM

## 2023-11-19 DIAGNOSIS — Z0001 Encounter for general adult medical examination with abnormal findings: Secondary | ICD-10-CM

## 2023-11-19 DIAGNOSIS — I10 Essential (primary) hypertension: Secondary | ICD-10-CM | POA: Diagnosis not present

## 2023-11-19 DIAGNOSIS — M62838 Other muscle spasm: Secondary | ICD-10-CM | POA: Diagnosis not present

## 2023-11-19 DIAGNOSIS — G479 Sleep disorder, unspecified: Secondary | ICD-10-CM | POA: Diagnosis not present

## 2023-11-19 DIAGNOSIS — K219 Gastro-esophageal reflux disease without esophagitis: Secondary | ICD-10-CM | POA: Diagnosis not present

## 2023-11-19 MED ORDER — OMEPRAZOLE 40 MG PO CPDR
40.0000 mg | DELAYED_RELEASE_CAPSULE | Freq: Every day | ORAL | 3 refills | Status: AC
Start: 2023-11-19 — End: ?

## 2023-11-19 MED ORDER — ALPRAZOLAM 0.25 MG PO TABS: 0.2500 mg | ORAL_TABLET | Freq: Every day | ORAL | 5 refills | Status: DC | PRN

## 2023-11-19 MED ORDER — CYCLOBENZAPRINE HCL 10 MG PO TABS
10.0000 mg | ORAL_TABLET | Freq: Every evening | ORAL | 3 refills | Status: AC | PRN
Start: 2023-11-19 — End: ?

## 2023-11-19 MED ORDER — MELOXICAM 15 MG PO TABS: 15.0000 mg | ORAL_TABLET | Freq: Every day | ORAL | 3 refills | Status: AC | PRN

## 2023-11-19 MED ORDER — AMLODIPINE BESYLATE 2.5 MG PO TABS
2.5000 mg | ORAL_TABLET | Freq: Every day | ORAL | 3 refills | Status: AC
Start: 2023-11-19 — End: ?

## 2023-11-19 NOTE — Progress Notes (Signed)
 Leron Glance, NP-C Phone: [phone]  Yvonne Moore is a 63 y.o. female who presents today for annual exam.   Discussed the use of AI scribe software for clinical note transcription with the patient, who gave verbal consent to proceed.  History of Present Illness   Yvonne Moore is a 63 year old female who presents for an annual physical exam. She completed lab work prior to her appointment.   She is currently taking Zepbound and phentermine at a maintenance dose of 5 mg weekly due to increased cravings for unhealthy foods. Her diet is generally good, but her exercise has decreased due to the heat, and she is not walking as frequently as before. She engages in physical activity while caring for her grandson but lacks a structured exercise routine.  She is up to date with her pap smear and colonoscopy, although she received a notice to schedule another colonoscopy due to a previous polyp. She has had difficulty scheduling a mammogram, which was recommended to be contrast-enhanced. Her family history includes colon cancer in an uncle and breast cancer in some cousins, but no immediate family members are affected. She is up to date on her flu and tetanus vaccinations but has not received the shingles vaccine due to concerns about side effects.  She consumes about five alcoholic drinks per week, does not smoke, and denies drug use. She regularly sees a dentist and an eye doctor. She takes vitamin D  supplements and amlodipine  for blood pressure, which remains high despite a 60-pound weight loss. She does not regularly check her blood pressure at home.  She takes Celexa  for mood and anxiety, and Xanax  as needed, noting increased anxiety and a short fuse. She has a history of trying various medications for anxiety and depression, including Effexor , Zoloft , and Wellbutrin. She feels Celexa  helps but has been experiencing heightened anxiety recently.  She experiences chronic arthritic pain, which  worsened after discontinuing meloxicam . She previously took 15 mg daily, which helped manage her pain. She also takes Tylenol  PM or Aleve PM nightly to aid sleep, as she struggles to stay asleep despite being able to fall asleep easily.  She reports random itching without a rash and does not take an antihistamine daily. She experiences joint pain but no swelling in her legs. She takes a stool softener daily due to the effects of her medication.  Her recent lab work showed mildly decreased white blood cell count. She has a history of low neutrophils and has seen a hematologist for this issue. No chest pain, shortness of breath, abdominal pain, or trouble using the bathroom.      Social History   Tobacco Use  Smoking Status Former   Types: Cigarettes  Smokeless Tobacco Never  Tobacco Comments   socially in college, 1 pk lasted 1 month    Current Outpatient Medications on File Prior to Visit  Medication Sig Dispense Refill   Ascorbic Acid (VITAMIN C) 1000 MG tablet Take 1,000 mg by mouth daily.     Biotin 89999 MCG TABS Take 10,000 mcg by mouth daily.     Black Pepper-Turmeric (TURMERIC CURCUMIN) 09-998 MG CAPS Take 1 capsule by mouth daily.     Cholecalciferol (VITAMIN D ) 125 MCG (5000 UT) CAPS      Multiple Vitamins-Minerals (WOMENS MULTIVITAMIN PLUS PO) Take by mouth once.     Omega-3 Fatty Acids (FISH OIL) 1000 MG CAPS Take 1,000 mg by mouth daily.     phentermine (ADIPEX-P) 37.5 MG tablet Take 37.5 mg  by mouth daily before breakfast.     pyridOXINE (VITAMIN B-6) 100 MG tablet Take 100 mg by mouth daily.      ZEPBOUND 5 MG/0.5ML Pen Inject 5 mg into the skin once a week. Monday     No current facility-administered medications on file prior to visit.     ROS see history of present illness  Objective  Physical Exam Vitals:   11/19/23 0846 11/19/23 0859  BP: (!) 144/88 122/78  Pulse: 79   Temp: 98.4 F (36.9 C)   SpO2: 99%     BP Readings from Last 3 Encounters:   11/19/23 122/78  03/05/23 122/70  01/03/23 132/83   Wt Readings from Last 3 Encounters:  11/19/23 120 lb 12.8 oz (54.8 kg)  03/05/23 126 lb (57.2 kg)  01/03/23 132 lb 7.6 oz (60.1 kg)    Physical Exam Constitutional:      General: She is not in acute distress.    Appearance: Normal appearance.  HENT:     Head: Normocephalic.     Right Ear: Tympanic membrane normal.     Left Ear: Tympanic membrane normal.     Nose: Nose normal.     Mouth/Throat:     Mouth: Mucous membranes are moist.     Pharynx: Oropharynx is clear.  Eyes:     Conjunctiva/sclera: Conjunctivae normal.     Pupils: Pupils are equal, round, and reactive to light.  Neck:     Thyroid: No thyromegaly.  Cardiovascular:     Rate and Rhythm: Normal rate and regular rhythm.     Heart sounds: Normal heart sounds.  Pulmonary:     Effort: Pulmonary effort is normal.     Breath sounds: Normal breath sounds.  Abdominal:     General: Abdomen is flat. Bowel sounds are normal.     Palpations: Abdomen is soft. There is no mass.     Tenderness: There is no abdominal tenderness.  Musculoskeletal:        General: Normal range of motion.  Lymphadenopathy:     Cervical: No cervical adenopathy.  Skin:    General: Skin is warm and dry.     Findings: No rash.  Neurological:     General: No focal deficit present.     Mental Status: She is alert.  Psychiatric:        Mood and Affect: Mood normal.        Behavior: Behavior normal.      Assessment/Plan: Please see individual problem list.  Encounter for routine adult medical exam with abnormal findings Assessment & Plan: Physical exam complete. Lab results reviewed with patient. Pap smear and colonoscopy are up to date. Mammogram is due. Flu and tetanus vaccines are up to date. She has received 2 COVID vaccines and declines additional. Discussed the benefits and side effects of the shingles vaccine. Schedule and complete a mammogram. Consider the shingles vaccine at the  pharmacy. Continue routine dental and eye exams. Encourage healthy diet and regular exercise. Return to care in 6 months, sooner as needed.    Anxiety and depression Assessment & Plan: She experiences increased anxiety and irritability with non-daily Xanax  use and prefers to avoid additional medication unless necessary. Increase Celexa  to 30 mg daily and reassess in two weeks for a potential increase to 40 mg. Refill Xanax  for as-needed use. PDMP reviewed. PHQ- 5 and GAD- 7 today. Encouraged to contact if worsening symptoms, unusual behavior changes or suicidal thoughts occur.   Orders: -  ALPRAZolam ; Take 1 tablet (0.25 mg total) by mouth daily as needed for anxiety.  Dispense: 30 tablet; Refill: 5  Sleeping difficulty Assessment & Plan: She has difficulty staying asleep and uses Tylenol  PM or Aleve PM nightly, but is concerned about long-term use. Discussed alternatives without analgesics. Try Z-Quil, Unisom, or melatonin instead of Tylenol  PM or Aleve PM. Avoid Flexeril  or Xanax  for sleep unless necessary.   Arthralgia of multiple joints Assessment & Plan: Her chronic arthritic pain worsened after stopping meloxicam , and Aleve and Tylenol  have been ineffective. She agreed to resume meloxicam  with monitoring, as her kidney function is normal. Refill meloxicam  15 mg daily as needed and monitor kidney function regularly.   Orders: -     Meloxicam ; Take 1 tablet (15 mg total) by mouth daily as needed for pain.  Dispense: 90 tablet; Refill: 3  Primary hypertension Assessment & Plan: Blood pressure elevated in office, improvement noted on recheck. Previously well controlled. Currently taking Norvasc  2.5 mg daily. Continue. We will continue to monitor.   Orders: -     amLODIPine  Besylate; Take 1 tablet (2.5 mg total) by mouth daily.  Dispense: 90 tablet; Refill: 3  Muscle spasm -     Cyclobenzaprine  HCl; Take 1 tablet (10 mg total) by mouth at bedtime as needed for muscle spasms.   Dispense: 90 tablet; Refill: 3  Gastroesophageal reflux disease without esophagitis -     Omeprazole ; Take 1 capsule (40 mg total) by mouth daily.  Dispense: 90 capsule; Refill: 3     Return in about 6 months (around 05/21/2024) for Follow up.   Leron Glance, NP-C [facility]

## 2023-11-20 ENCOUNTER — Ambulatory Visit: Payer: Self-pay | Admitting: Nurse Practitioner

## 2023-11-26 ENCOUNTER — Encounter: Payer: Self-pay | Admitting: Nurse Practitioner

## 2023-11-27 ENCOUNTER — Other Ambulatory Visit: Payer: Self-pay | Admitting: Nurse Practitioner

## 2023-11-27 ENCOUNTER — Encounter: Payer: Self-pay | Admitting: Nurse Practitioner

## 2023-11-27 DIAGNOSIS — F419 Anxiety disorder, unspecified: Secondary | ICD-10-CM

## 2023-11-27 DIAGNOSIS — G479 Sleep disorder, unspecified: Secondary | ICD-10-CM | POA: Insufficient documentation

## 2023-11-27 DIAGNOSIS — Z0001 Encounter for general adult medical examination with abnormal findings: Secondary | ICD-10-CM | POA: Insufficient documentation

## 2023-11-27 MED ORDER — CITALOPRAM HYDROBROMIDE 40 MG PO TABS
40.0000 mg | ORAL_TABLET | Freq: Every day | ORAL | 3 refills | Status: DC
Start: 2023-11-27 — End: 2023-12-01

## 2023-11-27 NOTE — Assessment & Plan Note (Signed)
 Blood pressure elevated in office, improvement noted on recheck. Previously well controlled. Currently taking Norvasc  2.5 mg daily. Continue. We will continue to monitor.

## 2023-11-27 NOTE — Assessment & Plan Note (Signed)
 Her chronic arthritic pain worsened after stopping meloxicam , and Aleve and Tylenol  have been ineffective. She agreed to resume meloxicam  with monitoring, as her kidney function is normal. Refill meloxicam  15 mg daily as needed and monitor kidney function regularly.

## 2023-11-27 NOTE — Assessment & Plan Note (Signed)
 She has difficulty staying asleep and uses Tylenol  PM or Aleve PM nightly, but is concerned about long-term use. Discussed alternatives without analgesics. Try Z-Quil, Unisom, or melatonin instead of Tylenol  PM or Aleve PM. Avoid Flexeril  or Xanax  for sleep unless necessary.

## 2023-11-27 NOTE — Assessment & Plan Note (Signed)
 She experiences increased anxiety and irritability with non-daily Xanax  use and prefers to avoid additional medication unless necessary. Increase Celexa  to 30 mg daily and reassess in two weeks for a potential increase to 40 mg. Refill Xanax  for as-needed use. PDMP reviewed. PHQ- 5 and GAD- 7 today. Encouraged to contact if worsening symptoms, unusual behavior changes or suicidal thoughts occur.

## 2023-11-27 NOTE — Assessment & Plan Note (Signed)
 Physical exam complete. Lab results reviewed with patient. Pap smear and colonoscopy are up to date. Mammogram is due. Flu and tetanus vaccines are up to date. She has received 2 COVID vaccines and declines additional. Discussed the benefits and side effects of the shingles vaccine. Schedule and complete a mammogram. Consider the shingles vaccine at the pharmacy. Continue routine dental and eye exams. Encourage healthy diet and regular exercise. Return to care in 6 months, sooner as needed.

## 2023-12-01 ENCOUNTER — Other Ambulatory Visit: Payer: Self-pay

## 2023-12-01 DIAGNOSIS — F32A Depression, unspecified: Secondary | ICD-10-CM

## 2023-12-01 MED ORDER — CITALOPRAM HYDROBROMIDE 40 MG PO TABS: 40.0000 mg | ORAL_TABLET | Freq: Every day | ORAL | 3 refills | Status: AC

## 2023-12-02 ENCOUNTER — Ambulatory Visit
Admission: RE | Admit: 2023-12-02 | Discharge: 2023-12-02 | Disposition: A | Discharge status: Home / Self Care | Source: Ambulatory Visit | Attending: General Surgery | Admitting: General Surgery

## 2023-12-02 DIAGNOSIS — Z1231 Encounter for screening mammogram for malignant neoplasm of breast: Secondary | ICD-10-CM

## 2023-12-02 MED ORDER — IOPAMIDOL (ISOVUE-370) INJECTION 76%
100.0000 mL | Freq: Once | INTRAVENOUS | Status: AC | PRN
  Administered 2023-12-02: 100 mL via INTRAVENOUS

## 2024-03-04 NOTE — Progress Notes (Signed)
 63 y.o. G1P1001 postmenopausal female with hx of right breast lumpectomy (atypical hyperplasia, 2017, followed by Gen Surg, annual contract enhanced MMG), former smoker here for annual exam. Retired runner, broadcasting/film/video.  60-year-old grandson.  Family history of Lynch syndrome, patient has had negative genetic testing in 2023. Doing well today.  Noticing urinary urgency however only occasionally.  No nocturia.  No SUI.  Postmenopausal bleeding: none Pelvic discharge or pain: none Breast mass, nipple discharge or skin changes : none Last PAP:     Component Value Date/Time   DIAGPAP  03/05/2023 1205    - Negative for intraepithelial lesion or malignancy (NILM)   DIAGPAP  02/07/2020 1419    - Negative for intraepithelial lesion or malignancy (NILM)   DIAGPAP  12/18/2017 0000    NEGATIVE FOR INTRAEPITHELIAL LESIONS OR MALIGNANCY.   HPVHIGH Negative 03/05/2023 1205   HPVHIGH Negative 02/07/2020 1419   ADEQPAP  03/05/2023 1205    Satisfactory for evaluation. The presence or absence of an   ADEQPAP  03/05/2023 1205    endocervical/transformation zone component cannot be determined because   ADEQPAP of atrophy. 03/05/2023 1205   Last mammogram: 12/02/23  contrast enhanced MMG, BIRADS 1, density D Last colonoscopy: 01/03/23 +polyp, q5y  Last DXA: none Sexually active: no  Exercising: walking several times a week  Flowsheet Row Office Visit from 03/08/2024 in [facility]  PHQ-2 Total Score 0      03/08/2024   11:27 AM 11/19/2023    8:48 AM 11/13/2022    8:48 AM  PHQ9 SCORE ONLY  PHQ-9 Total Score 0 5  1      Data saved with a previous flowsheet row definition    GYN HISTORY: Pap 10/28/16 negative with +HPV 10/2015 breast lumpectomy with atypical hyperplasia  OB History  Gravida Para Term Preterm AB Living  1 1 1   1   SAB IAB Ectopic Multiple Live Births      1    # Outcome Date GA Lbr Len/2nd Weight Sex Type Anes PTL Lv  1 Term 03/29/88    CHRISTELLA Vag-Spont    LIV    Past Medical History:  Diagnosis Date   Anxiety    Arthritis    hips, low back   Breast mass, right    Chicken pox    COVID    end 01/2021   Depression    GERD (gastroesophageal reflux disease)    Plantar fasciitis    PONV (postoperative nausea and vomiting)    Vertigo     Past Surgical History:  Procedure Laterality Date   BREAST BIOPSY Right 10/17/2015   stereo/atypical ductal hyperplasia   BREAST BIOPSY Left 11/24/2017   x marker, PSEUDO-ANGIOMATOUS STROMAL HYPERPLASIA   BREAST EXCISIONAL BIOPSY Right 2017   atypical ductal hyperplasia excisied   BREAST SURGERY Bilateral 2010   Reduction    CARPAL TUNNEL RELEASE     Unsure of date and which laterality    CERVICAL SPINE SURGERY  2012   Discectomy and fusion of C4,5, and 6 Dr. Elta    COLONOSCOPY WITH PROPOFOL  N/A 01/03/2023   Procedure: COLONOSCOPY WITH PROPOFOL ;  Surgeon: Unk Corinn Skiff, MD;  Location: [facility] END[facility]COPY;  Service: Gastroenterology;  Laterality: N/A;   HIP ARTHROPLASTY Bilateral    Dr. Leora emerge ortho b/l hips since 07/2021   LASIK Bilateral 2001   POLYPECTOMY  01/03/2023   Procedure: POLYPECTOMY;  Surgeon: Unk Corinn Skiff, MD;  Location: [facility] ENDOSCOPY;  Service: Gastroenterology;;  RADIOACTIVE SEED GUIDED EXCISIONAL BREAST BIOPSY Right 12/04/2015   Procedure: RADIOACTIVE SEED GUIDED EXCISIONAL BREAST BIOPSY;  Surgeon: Donnice Bury, MD;  Location: [facility];  Service: General;  Laterality: Right;   REDUCTION MAMMAPLASTY Bilateral 2010   REVISION TOTAL HIP ARTHROPLASTY Bilateral 2023   SHOULDER SURGERY Left 1982   bone spur removed/ bone separation repair   thumb joint replacement     L thumb Dr. Camella    TONSILLECTOMY AND ADENOIDECTOMY  1967    Current Outpatient Medications on File Prior to Visit  Medication Sig Dispense Refill   ALPRAZolam  (XANAX ) 0.25 MG tablet Take 1 tablet (0.25 mg total) by mouth daily as needed for anxiety. 30 tablet 5   amLODipine   (NORVASC ) 2.5 MG tablet Take 1 tablet (2.5 mg total) by mouth daily. 90 tablet 3   Ascorbic Acid (VITAMIN C) 1000 MG tablet Take 1,000 mg by mouth daily.     Biotin 89999 MCG TABS Take 10,000 mcg by mouth daily.     Black Pepper-Turmeric (TURMERIC CURCUMIN) 09-998 MG CAPS Take 1 capsule by mouth daily.     Cholecalciferol (VITAMIN D ) 125 MCG (5000 UT) CAPS      citalopram  (CELEXA ) 40 MG tablet Take 1 tablet (40 mg total) by mouth daily. 90 tablet 3   cyclobenzaprine  (FLEXERIL ) 10 MG tablet Take 1 tablet (10 mg total) by mouth at bedtime as needed for muscle spasms. 90 tablet 3   meloxicam  (MOBIC ) 15 MG tablet Take 1 tablet (15 mg total) by mouth daily as needed for pain. 90 tablet 3   Multiple Vitamins-Minerals (WOMENS MULTIVITAMIN PLUS PO) Take by mouth once.     Omega-3 Fatty Acids (FISH OIL) 1000 MG CAPS Take 1,000 mg by mouth daily.     omeprazole  (PRILOSEC) 40 MG capsule Take 1 capsule (40 mg total) by mouth daily. 90 capsule 3   phentermine (ADIPEX-P) 37.5 MG tablet Take 37.5 mg by mouth daily before breakfast.     pyridOXINE (VITAMIN B-6) 100 MG tablet Take 100 mg by mouth daily.      ZEPBOUND 5 MG/0.5ML Pen Inject 5 mg into the skin once a week. Monday (Patient taking differently: Inject 5 mg into the skin once a week. Monday - not every week, more like every 2 weeks)     No current facility-administered medications on file prior to visit.    Social History   Socioeconomic History   Marital status: Married    Spouse name: Not on file   Number of children: Not on file   Years of education: Not on file   Highest education level: Not on file  Occupational History   Not on file  Tobacco Use   Smoking status: Former    Types: Cigarettes   Smokeless tobacco: Never   Tobacco comments:    socially in college, 1 pk lasted 1 month  Vaping Use   Vaping status: Never Used  Substance and Sexual Activity   Alcohol use: Yes    Alcohol/week: 0.0 - 1.0 standard drinks of alcohol     Comment: socially   Drug use: No   Sexual activity: Not Currently    Birth control/protection: Post-menopausal  Other Topics Concern   Not on file  Social History Narrative   Married   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, systems    1 child son 73 y/o 11/2017, 1 granddaughter 4 months as of 03/05/18    Social Drivers of Corporate Investment Banker Strain: Not on  file  Food Insecurity: Not on file  Transportation Needs: Not on file  Physical Activity: Not on file  Stress: Not on file  Social Connections: Not on file  Intimate Partner Violence: Not on file    Family History  Problem Relation Age of Onset   Arthritis Mother    Hypertension Mother    Kidney disease Mother    Alcohol abuse Father    Arthritis Father    Lung cancer Father 65   Hypertension Father    Alcohol abuse Brother    Hypertension Brother    Colon polyps Brother    Lung cancer Maternal Grandmother    Hypertension Maternal Grandmother    Arthritis Maternal Aunt    Arthritis Maternal Uncle    Lung cancer Maternal Uncle    Bladder Cancer Maternal Uncle    Colon cancer Paternal Uncle        x5 Lynch+   Leukemia Paternal Uncle    Lung cancer Paternal Uncle    Rectal cancer Cousin        vs anal   Breast cancer Cousin        dx 27s   Colon cancer Cousin        dx 53s; Lynch +   Melanoma Cousin    Bladder Cancer Cousin    Breast cancer Cousin        dx 16s   Other Other        lynch    Allergies  Allergen Reactions   Effexor  Xr [Venlafaxine  Hcl]     Worsening anxiety, thoughts of wanting to leave this earth tapered off end 12/2020/01/2021   Venlafaxine      Other Reaction(s): Not available  venlafaxine    Oxycodone Rash    Didn't develop immediately, occurred after taking for a week or so.      PE Today's Vitals   03/08/24 1120  BP: 122/78  Pulse: 75  Temp: 98.1 F (36.7 C)  TempSrc: Oral  SpO2: 99%  Weight: 121 lb (54.9 kg)  Height: 5' 3.5 (1.613 m)   Body mass index is 21.1  kg/m.  Physical Exam Vitals reviewed. Exam conducted with a chaperone present.  Constitutional:      General: She is not in acute distress.    Appearance: Normal appearance.  HENT:     Head: Normocephalic and atraumatic.     Nose: Nose normal.  Eyes:     Extraocular Movements: Extraocular movements intact.     Conjunctiva/sclera: Conjunctivae normal.  Neck:     Thyroid: No thyroid mass, thyromegaly or thyroid tenderness.  Pulmonary:     Effort: Pulmonary effort is normal.  Chest:     Chest wall: No mass or tenderness.  Breasts:    Right: Normal. No swelling, mass, nipple discharge, skin change or tenderness.     Left: Normal. No swelling, mass, nipple discharge, skin change or tenderness.     Comments: Bilateral breast reduction scars Abdominal:     General: There is no distension.     Palpations: Abdomen is soft.     Tenderness: There is no abdominal tenderness.  Genitourinary:    General: Normal vulva.     Exam position: Lithotomy position.     Urethra: No prolapse.     Vagina: Normal. No vaginal discharge or bleeding.     Cervix: Normal. No lesion.     Uterus: Normal. Not enlarged and not tender.      Adnexa: Right adnexa normal and left adnexa normal.  Comments: Kegel 2/5 Musculoskeletal:        General: Normal range of motion.     Cervical back: Normal range of motion.  Lymphadenopathy:     Upper Body:     Right upper body: No axillary adenopathy.     Left upper body: No axillary adenopathy.     Lower Body: No right inguinal adenopathy. No left inguinal adenopathy.  Skin:    General: Skin is warm and dry.  Neurological:     General: No focal deficit present.     Mental Status: She is alert.  Psychiatric:        Mood and Affect: Mood normal.        Behavior: Behavior normal.      Assessment and Plan:        Well woman exam with routine gynecological exam Assessment & Plan: Cervical cancer screening performed according to ASCCP guidelines. Encouraged  annual mammogram screening Colonoscopy UTD DXA not indicated Labs and immunizations with her primary Encouraged safe sexual practices as indicated Encouraged healthy lifestyle practices with diet and exercise For patients under 50-70yo, I recommend 1200mg  calcium daily and 600IU of vitamin D  daily.    Screening for depression  Urge incontinence  Mild symptoms, recommend Kegel exercises.  Vera LULLA Pa, MD

## 2024-03-08 ENCOUNTER — Ambulatory Visit (INDEPENDENT_AMBULATORY_CARE_PROVIDER_SITE_OTHER): Payer: Self-pay | Admitting: Obstetrics and Gynecology

## 2024-03-08 ENCOUNTER — Encounter: Payer: Self-pay | Admitting: Obstetrics and Gynecology

## 2024-03-08 VITALS — BP 122/78 | HR 75 | Temp 98.1°F | Ht 63.5 in | Wt 121.0 lb

## 2024-03-08 DIAGNOSIS — Z1331 Encounter for screening for depression: Secondary | ICD-10-CM

## 2024-03-08 DIAGNOSIS — N3941 Urge incontinence: Secondary | ICD-10-CM | POA: Insufficient documentation

## 2024-03-08 DIAGNOSIS — Z01419 Encounter for gynecological examination (general) (routine) without abnormal findings: Secondary | ICD-10-CM | POA: Diagnosis not present

## 2024-03-08 NOTE — Patient Instructions (Signed)

## 2024-03-08 NOTE — Assessment & Plan Note (Signed)
 Cervical cancer screening performed according to ASCCP guidelines. Encouraged annual mammogram screening Colonoscopy UTD DXA not indicated Labs and immunizations with her primary Encouraged safe sexual practices as indicated Encouraged healthy lifestyle practices with diet and exercise For patients under 50-62yo, I recommend 1200mg  calcium daily and 600IU of vitamin D daily.

## 2024-05-21 ENCOUNTER — Encounter: Payer: Self-pay | Admitting: Nurse Practitioner

## 2024-05-21 ENCOUNTER — Ambulatory Visit: Admitting: Nurse Practitioner

## 2024-05-21 VITALS — BP 132/88 | HR 76 | Temp 98.4°F | Ht 63.5 in | Wt 123.0 lb

## 2024-05-21 DIAGNOSIS — I10 Essential (primary) hypertension: Secondary | ICD-10-CM

## 2024-05-21 DIAGNOSIS — L299 Pruritus, unspecified: Secondary | ICD-10-CM

## 2024-05-21 DIAGNOSIS — F32A Depression, unspecified: Secondary | ICD-10-CM

## 2024-05-21 DIAGNOSIS — F419 Anxiety disorder, unspecified: Secondary | ICD-10-CM

## 2024-05-21 LAB — IBC + FERRITIN
Ferritin: 25.9 ng/mL (ref 10.0–291.0)
Iron: 107 ug/dL (ref 42–145)
Saturation Ratios: 29.4 % (ref 20.0–50.0)
TIBC: 364 ug/dL (ref 250.0–450.0)
Transferrin: 260 mg/dL (ref 212.0–360.0)

## 2024-05-21 LAB — CBC WITH DIFFERENTIAL/PLATELET
Basophils Absolute: 0.1 K/uL (ref 0.0–0.1)
Basophils Relative: 1.9 % (ref 0.0–3.0)
Eosinophils Absolute: 0.1 K/uL (ref 0.0–0.7)
Eosinophils Relative: 1.9 % (ref 0.0–5.0)
HCT: 37.7 % (ref 36.0–46.0)
Hemoglobin: 12.7 g/dL (ref 12.0–15.0)
Lymphocytes Relative: 40 % (ref 12.0–46.0)
Lymphs Abs: 1.6 K/uL (ref 0.7–4.0)
MCHC: 33.8 g/dL (ref 30.0–36.0)
MCV: 90.6 fl (ref 78.0–100.0)
Monocytes Absolute: 0.3 K/uL (ref 0.1–1.0)
Monocytes Relative: 8.3 % (ref 3.0–12.0)
Neutro Abs: 1.9 K/uL (ref 1.4–7.7)
Neutrophils Relative %: 47.9 % (ref 43.0–77.0)
Platelets: 247 K/uL (ref 150.0–400.0)
RBC: 4.16 Mil/uL (ref 3.87–5.11)
RDW: 13.2 % (ref 11.5–15.5)
WBC: 4 K/uL (ref 4.0–10.5)

## 2024-05-21 LAB — COMPREHENSIVE METABOLIC PANEL WITH GFR
ALT: 32 U/L (ref 3–35)
AST: 27 U/L (ref 5–37)
Albumin: 4.3 g/dL (ref 3.5–5.2)
Alkaline Phosphatase: 45 U/L (ref 39–117)
BUN: 14 mg/dL (ref 6–23)
CO2: 32 meq/L (ref 19–32)
Calcium: 9.4 mg/dL (ref 8.4–10.5)
Chloride: 101 meq/L (ref 96–112)
Creatinine, Ser: 0.63 mg/dL (ref 0.40–1.20)
GFR: 95.27 mL/min
Glucose, Bld: 70 mg/dL (ref 70–99)
Potassium: 5 meq/L (ref 3.5–5.1)
Sodium: 137 meq/L (ref 135–145)
Total Bilirubin: 0.4 mg/dL (ref 0.2–1.2)
Total Protein: 6.6 g/dL (ref 6.0–8.3)

## 2024-05-21 LAB — TSH: TSH: 0.81 u[IU]/mL (ref 0.35–5.50)

## 2024-05-21 MED ORDER — ALPRAZOLAM 0.25 MG PO TABS: 0.2500 mg | ORAL_TABLET | Freq: Every day | ORAL | 2 refills | Status: AC | PRN

## 2024-05-21 MED ORDER — HYDROXYZINE PAMOATE 25 MG PO CAPS: 25.0000 mg | ORAL_CAPSULE | Freq: Three times a day (TID) | ORAL | 2 refills | Status: AC | PRN

## 2024-05-21 NOTE — Assessment & Plan Note (Signed)
 Her blood pressure is controlled at 132/88 mmHg on amlodipine  2.5 mg daily. Continue amlodipine  2.5 mg daily.

## 2024-05-21 NOTE — Assessment & Plan Note (Signed)
 Her condition is stable on Celexa  40 mg daily and Xanax  as needed. Continue Celexa  40 mg daily. Use Xanax  0.25 mg as needed for situational anxiety. PDMP reviewed. Refills sent. Encouraged to contact if worsening symptoms, unusual behavior changes or suicidal thoughts occur. We will continue to monitor.

## 2024-05-21 NOTE — Assessment & Plan Note (Addendum)
 She experiences intense itching without rash, worse at night, and Zyrtec is ineffective. There is no identifiable cause, and liver and kidney function were previously normal. Prescribe hydroxyzine  1-2 tablets every 8 hours as needed, adjusting for drowsiness. Check lab work as outlined. Further work up pending results.

## 2024-05-21 NOTE — Progress Notes (Signed)
 " Leron Glance, NP-C Phone: [phone]  Yvonne Moore is a 64 y.o. female who presents today for follow up.   Discussed the use of AI scribe software for clinical note transcription with the patient, who gave verbal consent to proceed.  History of Present Illness   Yvonne Moore is a 64 year old female who presents with persistent itching.  She experiences persistent itching that occurs randomly all over her body without any visible rash. The itching intensifies at night, especially when she is sitting and watching TV, and is severe enough to cause bruising from scratching. She has tried Zyrtec without improvement and currently takes Benadryl at night to aid sleep when the itching is severe.  She has not changed any products such as detergents or lotions, and there is no visible rash associated with the itching. She recalls a similar sensation localized to her nipples following a breast reduction surgery in 2010, but notes that the current itching is more widespread and not limited to that area. She has consulted a dermatologist who suggested antihistamines, but no changes in her routine or products have been identified as potential causes.  Her current medications include amlodipine  2.5 mg once daily for blood pressure, Celexa  40 mg for anxiety, Xanax  as needed for situational anxiety, and meloxicam  for arthritis. She is also on phentermine and Zepbound for weight management, and she sees her weight loss doctor every 8 to 10 weeks for check-ins.  She has received both shingles vaccines and a flu shot recently. She monitors her blood pressure at home, which has been stable and lower than the readings at the clinic. No chest pain, shortness of breath, dizziness, or swelling.      Tobacco Use History[1]  Medications Ordered Prior to Encounter[2]   ROS see history of present illness  Objective  Physical Exam Vitals:   05/21/24 0917  BP: 132/88  Pulse: 76  Temp: 98.4 F (36.9 C)   SpO2: 99%    BP Readings from Last 3 Encounters:  05/21/24 132/88  03/08/24 122/78  11/19/23 122/78   Wt Readings from Last 3 Encounters:  05/21/24 123 lb (55.8 kg)  03/08/24 121 lb (54.9 kg)  11/19/23 120 lb 12.8 oz (54.8 kg)    Physical Exam Constitutional:      General: She is not in acute distress.    Appearance: Normal appearance.  HENT:     Head: Normocephalic.  Cardiovascular:     Rate and Rhythm: Normal rate and regular rhythm.     Heart sounds: Normal heart sounds.  Pulmonary:     Effort: Pulmonary effort is normal.     Breath sounds: Normal breath sounds.  Skin:    General: Skin is warm and dry.  Neurological:     General: No focal deficit present.     Mental Status: She is alert.  Psychiatric:        Mood and Affect: Mood normal.        Behavior: Behavior normal.      Assessment/Plan: Please see individual problem list.  Pruritus Assessment & Plan: She experiences intense itching without rash, worse at night, and Zyrtec is ineffective. There is no identifiable cause, and liver and kidney function were previously normal. Prescribe hydroxyzine  1-2 tablets every 8 hours as needed, adjusting for drowsiness. Check lab work as outlined. Further work up pending results.   Orders: -     CBC with Differential/Platelet -     TSH -     IBC + Ferritin -  HepB+HepC+HIV Panel -     Lactate dehydrogenase -     hydrOXYzine  Pamoate; Take 1-2 capsules (25-50 mg total) by mouth every 8 (eight) hours as needed for itching.  Dispense: 60 capsule; Refill: 2  Primary hypertension Assessment & Plan: Her blood pressure is controlled at 132/88 mmHg on amlodipine  2.5 mg daily. Continue amlodipine  2.5 mg daily.  Orders: -     Comprehensive metabolic panel with GFR  Anxiety and depression Assessment & Plan: Her condition is stable on Celexa  40 mg daily and Xanax  as needed. Continue Celexa  40 mg daily. Use Xanax  0.25 mg as needed for situational anxiety. PDMP reviewed.  Refills sent. Encouraged to contact if worsening symptoms, unusual behavior changes or suicidal thoughts occur. We will continue to monitor.   Orders: -     ALPRAZolam ; Take 1 tablet (0.25 mg total) by mouth daily as needed for anxiety.  Dispense: 30 tablet; Refill: 2     Return in about 6 months (around 11/18/2024) for Annual Exam. Please complete fasting lab work 2-3 days prior. SABRA Leron Glance, NP-C [facility] [place]     [1]  Social History Tobacco Use  Smoking Status Former   Types: Cigarettes  Smokeless Tobacco Never  Tobacco Comments   socially in college, 1 pk lasted 1 month  [2]  Current Outpatient Medications on File Prior to Visit  Medication Sig Dispense Refill   amLODipine  (NORVASC ) 2.5 MG tablet Take 1 tablet (2.5 mg total) by mouth daily. 90 tablet 3   Ascorbic Acid (VITAMIN C) 1000 MG tablet Take 1,000 mg by mouth daily.     Biotin 89999 MCG TABS Take 10,000 mcg by mouth daily.     Black Pepper-Turmeric (TURMERIC CURCUMIN) 09-998 MG CAPS Take 1 capsule by mouth daily.     Cholecalciferol (VITAMIN D ) 125 MCG (5000 UT) CAPS      citalopram  (CELEXA ) 40 MG tablet Take 1 tablet (40 mg total) by mouth daily. 90 tablet 3   cyclobenzaprine  (FLEXERIL ) 10 MG tablet Take 1 tablet (10 mg total) by mouth at bedtime as needed for muscle spasms. 90 tablet 3   meloxicam  (MOBIC ) 15 MG tablet Take 1 tablet (15 mg total) by mouth daily as needed for pain. 90 tablet 3   Multiple Vitamins-Minerals (WOMENS MULTIVITAMIN PLUS PO) Take by mouth once.     Omega-3 Fatty Acids (FISH OIL) 1000 MG CAPS Take 1,000 mg by mouth daily.     omeprazole  (PRILOSEC) 40 MG capsule Take 1 capsule (40 mg total) by mouth daily. 90 capsule 3   phentermine (ADIPEX-P) 37.5 MG tablet Take 37.5 mg by mouth daily before breakfast.     pyridOXINE (VITAMIN B-6) 100 MG tablet Take 100 mg by mouth daily.      ZEPBOUND 5 MG/0.5ML Pen Inject 5 mg into the skin once a week. Monday (Patient  taking differently: Inject 5 mg into the skin once a week. Monday - not every week, more like every 2 weeks)     No current facility-administered medications on file prior to visit.   "

## 2024-05-22 LAB — HEPB+HEPC+HIV PANEL
HIV Screen 4th Generation wRfx: NONREACTIVE
Hep B C IgM: NEGATIVE
Hep B Core Total Ab: NEGATIVE
Hep B E Ab: NONREACTIVE
Hep B E Ag: NEGATIVE
Hep B Surface Ab, Qual: NONREACTIVE
Hep C Virus Ab: NONREACTIVE
Hepatitis B Surface Ag: NEGATIVE

## 2024-05-22 LAB — LACTATE DEHYDROGENASE: LDH: 178 U/L (ref 120–250)

## 2024-05-28 ENCOUNTER — Ambulatory Visit: Payer: Self-pay | Admitting: Nurse Practitioner

## 2024-11-26 ENCOUNTER — Other Ambulatory Visit

## 2024-12-01 ENCOUNTER — Encounter: Admitting: Nurse Practitioner
